# Patient Record
Sex: Female | Born: 1980 | Race: Black or African American | Hispanic: No | Marital: Single | Smoking: Former smoker
Health system: Southern US, Community
[De-identification: ages and names within clinical notes are randomized; demographics above are authoritative.]

## PROBLEM LIST (undated history)

## (undated) DIAGNOSIS — M549 Dorsalgia, unspecified: Secondary | ICD-10-CM

## (undated) DIAGNOSIS — J439 Emphysema, unspecified: Secondary | ICD-10-CM

## (undated) DIAGNOSIS — T7840XA Allergy, unspecified, initial encounter: Secondary | ICD-10-CM

## (undated) DIAGNOSIS — J45909 Unspecified asthma, uncomplicated: Secondary | ICD-10-CM

## (undated) DIAGNOSIS — Z6841 Body Mass Index (BMI) 40.0 and over, adult: Secondary | ICD-10-CM

## (undated) HISTORY — DX: Allergy, unspecified, initial encounter: T78.40XA

## (undated) HISTORY — DX: Morbid (severe) obesity due to excess calories: E66.01

## (undated) HISTORY — PX: KNEE SURGERY: SHX244

## (undated) HISTORY — DX: Dorsalgia, unspecified: M54.9

## (undated) HISTORY — DX: Body Mass Index (BMI) 40.0 and over, adult: Z684

---

## 2009-08-30 ENCOUNTER — Ambulatory Visit: Payer: Self-pay | Admitting: Anesthesiology

## 2014-06-01 ENCOUNTER — Ambulatory Visit: Payer: Self-pay | Admitting: Family Medicine

## 2014-06-15 ENCOUNTER — Ambulatory Visit: Payer: Self-pay | Admitting: Family Medicine

## 2014-07-14 ENCOUNTER — Ambulatory Visit: Payer: Self-pay | Admitting: Orthopedic Surgery

## 2014-11-24 ENCOUNTER — Ambulatory Visit: Payer: Self-pay | Admitting: Orthopedic Surgery

## 2015-03-09 ENCOUNTER — Ambulatory Visit
Admit: 2015-03-09 | Disposition: A | Discharge status: Home / Self Care | Payer: Self-pay | Attending: Family Medicine | Admitting: Family Medicine

## 2015-04-01 NOTE — Op Note (Signed)
PATIENT NAME:  Virginia Camacho, Virginia Camacho MR#:  [id number] DATE OF BIRTH:  May 22, 1981  DATE OF PROCEDURE:  11/24/2014  PREOPERATIVE DIAGNOSIS: Right knee anterior cruciate ligament tear.   POSTOPERATIVE DIAGNOSIS: Right knee anterior cruciate ligament tear.   PROCEDURE: Right knee anterior cruciate ligament reconstruction.   ANESTHESIA: General.   SURGEON: Leitha SchullerMichael J. Yoshimi Sarr, MD  ASSISTANT: Lollie Marrow. Chris Gaines, PA-C  DESCRIPTION OF PROCEDURE: The patient was brought to the Operating Room and after adequate anesthesia was obtained, the right leg was prepped and draped in the usual sterile fashion with an Alvarado leg holder utilized. After patient identification, a timeout procedure was completed. An inferolateral portal was made and the arthroscope was introduced. Initial inspection revealed a normal-appearing patellofemoral articulation coming around medially. An inferomedial portal was made and the medial meniscus was intact. 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 The anterior cruciate ligament was completely torn and adherent to the PCL, detached off the femoral side. The lateral compartment was normal.   The knee was prepared by first debriding the old ACL down to stumps, and localizing the femoral and tibial footprints. Next, a 6.5 mm guide was utilized through the anterior medial portal with drilling approximately 25 mm for the bone tunnel on the femoral side. Through this anteromedial portal, the guide was placed for the tibial guide pin, and a small incision made and a 10 mm drill hole made. This 10 mm was based on preparation of the anterior tibialis graft, which was prepared with splitting the tendon longitudinally, half way through, and attaching it to the Aper-Fix AM femoral implant, 10 x 24 mm.   After the graft had been prepared with sutures on all the ends of these, a suture was placed through the tibial tunnel out the anteromedial  portal. The implant was then placed and tapped into position and the implant tightened. With pulling on this, the implant was stable. The sutures were then passed out through the tibial tunnel and the 3 limbs of the graft were placed down to the tibial tunnel. The knee was cycled through a range of motion, and tension applied to the graft with the knee in extension as the tibial interference screw was placed with its tibial sleeve to provide compression against the graft. On inspection, the ACL appeared very stable to probing and to Lachman test.   Excess tendon was removed, along with the tab on the tibial side to try to prevent postoperative pain from this. The pre- and post-procedure pictures having been obtained, 2-0 Vicryl was used deep, 4-0 nylon for the skin, 30 mL of 0.5% Sensorcaine with epinephrine was infiltrated in the area of the incisions. Xeroform, 4x4's, Webril, Ace wrap, Polar Care and hinged knee brace locked in extension were applied.   ESTIMATED BLOOD LOSS: 50 mL   COMPLICATIONS: None.   SPECIMEN: None. Pre- and post-procedure pictures obtained.   IMPLANTS: Femoral Implant: [place] Aper-Fix AM, 10 x 24. Tibial Implants: 10 x 30 with an anterior tibialis allograft.    ____________________________ Leitha SchullerMichael J. Odysseus Cada, MD mjm:MT D: 11/24/2014 22:58:00 ET T: 11/25/2014 09:45:29 ET JOB#: [id number]  cc: Leitha SchullerMichael J. Meredeth Furber, MD, <Dictator> Leitha SchullerMICHAEL J Fin Hupp MD ELECTRONICALLY SIGNED 11/25/2014 13:11

## 2015-05-25 ENCOUNTER — Ambulatory Visit: Payer: Self-pay | Admitting: Family Medicine

## 2015-09-28 ENCOUNTER — Emergency Department
Admission: EM | Admit: 2015-09-28 | Discharge: 2015-09-28 | Disposition: A | Discharge status: Home / Self Care | Payer: Managed Care, Other (non HMO) | Attending: Emergency Medicine | Admitting: Emergency Medicine

## 2015-09-28 ENCOUNTER — Emergency Department: Payer: Managed Care, Other (non HMO)

## 2015-09-28 ENCOUNTER — Encounter: Payer: Self-pay | Admitting: *Deleted

## 2015-09-28 ENCOUNTER — Ambulatory Visit (INDEPENDENT_AMBULATORY_CARE_PROVIDER_SITE_OTHER): Payer: Managed Care, Other (non HMO) | Admitting: Family Medicine

## 2015-09-28 ENCOUNTER — Encounter: Payer: Self-pay | Admitting: Family Medicine

## 2015-09-28 VITALS — BP 108/75 | HR 102 | Temp 98.9°F | Resp 19 | Ht 63.0 in | Wt 247.2 lb

## 2015-09-28 DIAGNOSIS — R519 Headache, unspecified: Secondary | ICD-10-CM

## 2015-09-28 DIAGNOSIS — H1132 Conjunctival hemorrhage, left eye: Secondary | ICD-10-CM | POA: Diagnosis not present

## 2015-09-28 DIAGNOSIS — Z720 Tobacco use: Secondary | ICD-10-CM | POA: Diagnosis not present

## 2015-09-28 DIAGNOSIS — H5712 Ocular pain, left eye: Secondary | ICD-10-CM | POA: Diagnosis present

## 2015-09-28 DIAGNOSIS — R51 Headache: Secondary | ICD-10-CM | POA: Diagnosis not present

## 2015-09-28 NOTE — Progress Notes (Signed)
Name: Mariella SaaCandace M Schnetzer   MRN: [id number]    DOB: 1981-09-23   Date:09/28/2015       Progress Note  Subjective  Chief Complaint  Chief Complaint  Patient presents with  . Acute Visit    Pain aound eye (red) x2 days  . Headache    Eye Pain  The left eye is affected. This is a new problem. The current episode started yesterday. The problem occurs constantly. There was no injury mechanism. The pain is at a severity of 4/10. The pain is moderate. There is no known exposure to pink eye. She does not wear contacts. Associated symptoms include eye redness. Pertinent negatives include no blurred vision, eye discharge, double vision, nausea or vomiting. She has tried nothing for the symptoms.   Past Medical History  Diagnosis Date  . Allergy     History reviewed. No pertinent past surgical history.  Family History  Problem Relation Age of Onset  . Diabetes Mother     Social History   Social History  . Marital Status: Single    Spouse Name: N/A  . Number of Children: N/A  . Years of Education: N/A   Occupational History  . Not on file.   Social History Main Topics  . Smoking status: Current Every Day Smoker -- 1.00 packs/day    Types: Cigarettes  . Smokeless tobacco: Never Used  . Alcohol Use: 0.0 oz/week    0 Standard drinks or equivalent per week     Comment: rarely  . Drug Use: 1.00 per week    Special: Marijuana     Comment: occasional   . Sexual Activity: Not on file   Other Topics Concern  . Not on file   Social History Narrative  . No narrative on file     Current outpatient prescriptions:  .  acetaminophen (RA ACETAMINOPHEN) 650 MG CR tablet, Take by mouth., Disp: , Rfl:  .  cyclobenzaprine (FLEXERIL) 10 MG tablet, Take by mouth., Disp: , Rfl:   Allergies  Allergen Reactions  . Clarithromycin Other (See Comments) and Nausea Only   Review of Systems  Eyes: Positive for pain and redness. Negative for blurred vision, double vision and discharge.   Gastrointestinal: Negative for nausea and vomiting.  Neurological: Positive for headaches.    Objective  Filed Vitals:   09/28/15 1529  BP: 108/75  Pulse: 102  Temp: 98.9 F (37.2 C)  TempSrc: Oral  Resp: 19  Height: 5\' 3"  (1.6 m)  Weight: 247 lb 3.2 oz (112.129 kg)  SpO2: 98%    Physical Exam  HENT:  Head: Normocephalic.  Mouth/Throat: No posterior oropharyngeal erythema.  Tenderness to palpation over the left periorbital, and frontal area extending across the forehead.  Eyes: Left eye exhibits discharge. Left eye exhibits no exudate. No foreign body present in the left eye. Right conjunctiva has no hemorrhage. Left conjunctiva has a hemorrhage.  Cardiovascular: Normal rate and regular rhythm.   Pulmonary/Chest: Breath sounds normal.  Nursing note and vitals reviewed.    Assessment & Plan  1. Subconjunctival hemorrhage of left eye We will refer patient to ED for evaluation of subconjunctival hemorrhage associated with headaches.she will likely need a CT scan of the brain. Blood pressure is stable. Plan discussed with patient and the charge nurse at the ER.   Gittel Mccamish Asad A. Faylene KurtzShah [facility] 09/28/2015 4:21 PM

## 2015-09-28 NOTE — ED Notes (Signed)
Pt reports yesterday her left eye became red and she started having a headache.  Headache is on left side of face and around left eye.  Pt was seen by dr. Sherryll BurgerShah and was sent to the er for an eval for subconjunctival hemorrhage. No n/v/d.  Pt took tylenol this morning for headache without relief.

## 2015-09-28 NOTE — ED Provider Notes (Signed)
[facility] Emergency Department Provider Note  ____________________________________________  Time seen: 1749  I have reviewed the triage vital signs and the nursing notes.   HISTORY  Chief Complaint Eye Pain     HPI Virginia Camacho is a 34 y.o. female who reports she has been having a headache around her left eye for the past day and half. She has had some low-grade fever and she has been feeling kind of achy lately. She denies any neurologic dysfunction. Her hands and feet are moving well. She does have pain in her left eye and noticed that she had blood over the surface of part of the left eye yesterday. She went to see her regular doctor today[facility] Dr. Clelia CroftShaw was concerned about the headache in conjunction with a subconjunctival hemorrhage and sent the patient to the emergency department for a CT scan of the head.  The patient denies having headaches like this in the past. She denies any nausea or vomiting.   Past Medical History  Diagnosis Date  . Allergy     Patient Active Problem List   Diagnosis Date Noted  . Subconjunctival hemorrhage of left eye 09/28/2015    Past Surgical History  Procedure Laterality Date  . Knee surgery Right     Current Outpatient Rx  Name  Route  Sig  Dispense  Refill  . acetaminophen (RA ACETAMINOPHEN) 650 MG CR tablet   Oral   Take 650 mg by mouth every 8 (eight) hours as needed.          Marland Kitchen. HYDROcodone-acetaminophen (NORCO/VICODIN) 5-325 MG tablet   Oral   Take 1 tablet by mouth every 6 (six) hours as needed for moderate pain.         . cyclobenzaprine (FLEXERIL) 10 MG tablet   Oral   Take 10 mg by mouth 3 (three) times daily as needed.            Allergies Clarithromycin and Oxycodone  Family History  Problem Relation Age of Onset  . Diabetes Mother     Social History Social History  Substance Use Topics  . Smoking status: Current Every Day Smoker -- 1.00  packs/day    Types: Cigarettes  . Smokeless tobacco: Never Used  . Alcohol Use: 0.0 oz/week    0 Standard drinks or equivalent per week     Comment: rarely    Review of Systems  Constitutional: Negative for fever. ENT: Notable for some conjunctival hemorrhage and headache. Cardiovascular: Negative for chest pain. Respiratory: Negative for cough. Gastrointestinal: Negative for abdominal pain, vomiting and diarrhea. Genitourinary: Negative for dysuria. Musculoskeletal: No myalgias or injuries. Skin: Negative for rash. Neurological: Positive for headache, left periorbital.   10-point ROS otherwise negative.  ____________________________________________   PHYSICAL EXAM:  VITAL SIGNS: ED Triage Vitals  Enc Vitals Group     BP 09/28/15 1723 142/99 mmHg     Pulse Rate 09/28/15 1723 92     Resp 09/28/15 1723 18     Temp 09/28/15 1723 97.9 F (36.6 C)     Temp Source 09/28/15 1723 Oral     SpO2 09/28/15 1723 98 %     Weight 09/28/15 1723 247 lb (112.038 kg)     Height 09/28/15 1723 5\' 4"  (1.626 m)     Head Cir --      Peak Flow --      Pain Score 09/28/15 1725 6     Pain Loc --  Pain Edu? --      Excl. in GC? --     Constitutional: Alert and oriented. Well appearing and in no distress. ENT   Head: Normocephalic and atraumatic.   Nose: No congestion/rhinnorhea.         Eyes: Notable subconjunctival hemorrhage in the medial and medial superior portion of the left eye. Other than this, the examination appears normal. Extraocular muscles are intact. Pupil responses normal and equal bilaterally. Cardiovascular: Normal rate, regular rhythm, no murmur noted Respiratory:  Normal respiratory effort, no tachypnea.    Breath sounds are clear and equal bilaterally.  Gastrointestinal: Soft and nontender. No distention.  Back: No muscle spasm, no tenderness, no CVA tenderness. Musculoskeletal: No deformity noted. Nontender with normal range of motion in all extremities.   No noted edema. Neurologic:  Normal speech and language. Equal grip strength bilaterally. Negative Romberg and pronator drift. Good finger to nose. 5 or 5 strength in all 4 extremities. Intact sensation. Cranial nerves II through XII are intact. No gross focal neurologic deficits are appreciated.  Skin:  Skin is warm, dry. No rash noted. Psychiatric: Mood and affect are normal. Speech and behavior are normal.  ____________________________________________  ____________________________________________    RADIOLOGY  CT head: IMPRESSION: No acute intracranial abnormality.    PROCEDURES  ____________________________________________   INITIAL IMPRESSION / ASSESSMENT AND PLAN / ED COURSE  Pertinent labs & imaging results that were available during my care of the patient were reviewed by me and considered in my medical decision making (see chart for details).  This patient overall looks well and she has no focal neurologic deficit. She does have a headache behind the left eye. She was sent here to obtain a CT scan of the head. I've advised her that I do not think a CT scan is indicated, but she prefers to have the scan done for her own reassurance.  I do not see any indication for blood tests. The patient is appreciated this that she does not want to have a blood draw.  ----------------------------------------- 7:11 PM on 09/28/2015 -----------------------------------------  Head CT is negative.  Patient looks well. We will discharge her home with instructions about subconjunctival hemorrhage. ____________________________________________   FINAL CLINICAL IMPRESSION(S) / ED DIAGNOSES  Final diagnoses:  Subconjunctival hemorrhage of left eye  Left-sided headache      Darien Ramus, MD 09/28/15 [phone]

## 2015-09-28 NOTE — ED Notes (Addendum)
Patient states she was sent here by Dr. Sherryll BurgerShah for  subconjunctival hemorrhage of left eye. Patient states she has had left eye pain for two days. Patient states headache began last night

## 2015-09-28 NOTE — Discharge Instructions (Signed)
Your head CT was negative. Follow-up with regular doctor for further evaluation and ongoing treatment.  You may follow-up with an ophthalmologist for your eye as well as. Call [facility] for further evaluation. Return to the emergency department if you have any other urgent concerns.  Subconjunctival Hemorrhage Subconjunctival hemorrhage is bleeding that happens between the white part of your eye (sclera) and the clear membrane that covers the outside of your eye (conjunctiva). There are many tiny blood vessels near the surface of your eye. A subconjunctival hemorrhage happens when one or more of these vessels breaks and bleeds, causing a red patch to appear on your eye. This is similar to a bruise. Depending on the amount of bleeding, the red patch may only cover a small area of your eye or it may cover the entire visible part of the sclera. If a lot of blood collects under the conjunctiva, there may also be swelling. Subconjunctival hemorrhages do not affect your vision or cause pain, but your eye may feel irritated if there is swelling. Subconjunctival hemorrhages usually do not require treatment, and they disappear on their own within two weeks. CAUSES This condition may be caused by:  Mild trauma, such as rubbing your eye too hard.  Severe trauma or blunt injuries.  Coughing, sneezing, or vomiting.  Straining, such as when lifting a heavy object.  High blood pressure.  Recent eye surgery.  A history of diabetes.  Certain medicines, especially blood thinners (anticoagulants).  Other conditions, such as eye tumors, bleeding disorders, or blood vessel abnormalities. Subconjunctival hemorrhages can happen without an obvious cause.  SYMPTOMS  Symptoms of this condition include:  A bright red or dark red patch on the white part of the eye.  The red area may spread out to cover a larger area of the eye before it goes away.  The red area may turn brownish-yellow before it  goes away.  Swelling.  Mild eye irritation. DIAGNOSIS This condition is diagnosed with a physical exam. If your subconjunctival hemorrhage was caused by trauma, your health care provider may refer you to an eye specialist (ophthalmologist) or another specialist to check for other injuries. You may have other tests, including:  An eye exam.  A blood pressure check.  Blood tests to check for bleeding disorders. If your subconjunctival hemorrhage was caused by trauma, X-rays or a CT scan may be done to check for other injuries. TREATMENT Usually, no treatment is needed. Your health care provider may recommend eye drops or cold compresses to help with discomfort. HOME CARE INSTRUCTIONS  Take over-the-counter and prescription medicines only as directed by your health care provider.  Use eye drops or cold compresses to help with discomfort as directed by your health care provider.  Avoid activities, things, and environments that may irritate or injure your eye.  Keep all follow-up visits as told by your health care provider. This is important. SEEK MEDICAL CARE IF:  You have pain in your eye.  The bleeding does not go away within 3 weeks.  You keep getting new subconjunctival hemorrhages. SEEK IMMEDIATE MEDICAL CARE IF:  Your vision changes or you have difficulty seeing.  You suddenly develop severe sensitivity to light.  You develop a severe headache, persistent vomiting, confusion, or abnormal tiredness (lethargy).  Your eye seems to bulge or protrude from your eye socket.  You develop unexplained bruises on your body.  You have unexplained bleeding in another area of your body.   This information is not intended  to replace advice given to you by your health care provider. Make sure you discuss any questions you have with your health care provider.   Document Released: 11/25/2005 Document Revised: 08/16/2015 Document Reviewed: 02/01/2015 Elsevier Interactive Patient  Education [employer]2016 [employer]vier Inc.

## 2015-10-06 ENCOUNTER — Encounter: Payer: Self-pay | Admitting: Family Medicine

## 2015-10-06 ENCOUNTER — Ambulatory Visit (INDEPENDENT_AMBULATORY_CARE_PROVIDER_SITE_OTHER): Payer: Managed Care, Other (non HMO) | Admitting: Family Medicine

## 2015-10-06 VITALS — BP 118/82 | HR 97 | Temp 98.8°F | Resp 18 | Ht 63.0 in | Wt 244.2 lb

## 2015-10-06 DIAGNOSIS — G43909 Migraine, unspecified, not intractable, without status migrainosus: Secondary | ICD-10-CM | POA: Insufficient documentation

## 2015-10-06 DIAGNOSIS — G44219 Episodic tension-type headache, not intractable: Secondary | ICD-10-CM | POA: Diagnosis not present

## 2015-10-06 DIAGNOSIS — G8929 Other chronic pain: Secondary | ICD-10-CM | POA: Insufficient documentation

## 2015-10-06 DIAGNOSIS — M5442 Lumbago with sciatica, left side: Secondary | ICD-10-CM

## 2015-10-06 NOTE — Progress Notes (Signed)
Name: Virginia SaaCandace M Gatton   MRN: [id number]    DOB: [birth year]   Date:10/06/2015       Progress Note  Subjective  Chief Complaint  Chief Complaint  Patient presents with  . discuss CT results    HPI  Pt. Is here to discuss results of CT Scan of Head. She had conjunctivitis of left eye and a severe headache along with elevated BP at her last office visit. She was sent to the ER for evaluation. A CT scan of head showed no hemorrhage or other abnormality. BP has returned back to normal, conjunctivitis has resolved, and headache has almost resolved. She has a follow up scheduled with an ophthalmologist. She believes it was stress and anxiety that caused her to have a headache.  Past Medical History  Diagnosis Date  . Allergy     Past Surgical History  Procedure Laterality Date  . Knee surgery Right     Family History  Problem Relation Age of Onset  . Diabetes Mother     Social History   Social History  . Marital Status: Single    Spouse Name: N/A  . Number of Children: N/A  . Years of Education: N/A   Occupational History  . Not on file.   Social History Main Topics  . Smoking status: Current Every Day Smoker -- 1.00 packs/day    Types: Cigarettes  . Smokeless tobacco: Never Used  . Alcohol Use: 0.0 oz/week    0 Standard drinks or equivalent per week     Comment: rarely  . Drug Use: 1.00 per week    Special: Marijuana     Comment: occasional   . Sexual Activity: Not on file   Other Topics Concern  . Not on file   Social History Narrative    Current outpatient prescriptions:  .  Ergocalciferol 2000 UNITS TABS, Take by mouth., Disp: , Rfl:  .  mometasone (ELOCON) 0.1 % cream, ELOCON, 0.1% (External Cream)  1 (one) Cream daily for 15 days  Quantity: 1;  Refills: 1   Ordered :01-Mar-2015  Velta AddisonShah, Amit Meloy Asad MD;  Started 01-Mar-2015 Active, Disp: , Rfl:  .  acetaminophen (RA ACETAMINOPHEN) 650 MG CR tablet, Take 650 mg by mouth every 8 (eight) hours as needed. , Disp: ,  Rfl:  .  cyclobenzaprine (FLEXERIL) 10 MG tablet, Take 10 mg by mouth 3 (three) times daily as needed. , Disp: , Rfl:  .  HYDROcodone-acetaminophen (NORCO/VICODIN) 5-325 MG tablet, Take 1 tablet by mouth every 6 (six) hours as needed for moderate pain., Disp: , Rfl:   Allergies  Allergen Reactions  . Clarithromycin Other (See Comments) and Nausea Only    Hot  . Oxycodone Hives   Review of Systems  Constitutional: Negative for fever, chills and weight loss.  Eyes: Negative for blurred vision, double vision, photophobia, pain, discharge and redness.  Cardiovascular: Negative for chest pain.  Neurological: Negative for headaches.    Objective  Filed Vitals:   10/06/15 1037  BP: 118/82  Pulse: 97  Temp: 98.8 F (37.1 C)  Resp: 18  Height: 5\' 3"  (1.6 m)  Weight: 244 lb 4 oz (110.791 kg)  SpO2: 98%    Physical Exam  Constitutional: She is oriented to person, place, and time and well-developed, well-nourished, and in no distress.  HENT:  Head: Normocephalic and atraumatic.  Eyes: Conjunctivae are normal. Pupils are equal, round, and reactive to light.  Cardiovascular: Normal rate and regular rhythm.   Pulmonary/Chest: Effort normal  and breath sounds normal.  Neurological: She is alert and oriented to person, place, and time.  Nursing note and vitals reviewed.    Assessment & Plan  1. Episodic tension-type headache, not intractable Headache has almost completely resolved as his conjunctivitis. Discussed stress reduction strategies. Patient does not wish to take any medicine for anxiety at this stage.follow-up as needed.    Edan Juday Asad A. Faylene [facility] 10/06/2015 10:47 AM

## 2015-11-28 ENCOUNTER — Encounter: Payer: Managed Care, Other (non HMO) | Admitting: Family Medicine

## 2015-12-20 ENCOUNTER — Encounter (INDEPENDENT_AMBULATORY_CARE_PROVIDER_SITE_OTHER): Payer: Self-pay

## 2015-12-20 ENCOUNTER — Encounter: Payer: Self-pay | Admitting: Family Medicine

## 2015-12-20 ENCOUNTER — Ambulatory Visit (INDEPENDENT_AMBULATORY_CARE_PROVIDER_SITE_OTHER): Payer: Managed Care, Other (non HMO) | Admitting: Family Medicine

## 2015-12-20 ENCOUNTER — Ambulatory Visit
Admission: RE | Admit: 2015-12-20 | Discharge: 2015-12-20 | Disposition: A | Discharge status: Home / Self Care | Payer: Managed Care, Other (non HMO) | Source: Ambulatory Visit | Attending: Family Medicine | Admitting: Family Medicine

## 2015-12-20 VITALS — BP 120/69 | HR 101 | Temp 98.5°F | Resp 18 | Ht 63.0 in | Wt 246.5 lb

## 2015-12-20 DIAGNOSIS — M25561 Pain in right knee: Secondary | ICD-10-CM | POA: Insufficient documentation

## 2015-12-20 DIAGNOSIS — T1490XA Injury, unspecified, initial encounter: Principal | ICD-10-CM

## 2015-12-20 DIAGNOSIS — M79641 Pain in right hand: Secondary | ICD-10-CM

## 2015-12-20 DIAGNOSIS — M5136 Other intervertebral disc degeneration, lumbar region: Secondary | ICD-10-CM | POA: Insufficient documentation

## 2015-12-20 DIAGNOSIS — G8929 Other chronic pain: Secondary | ICD-10-CM | POA: Insufficient documentation

## 2015-12-20 NOTE — Progress Notes (Signed)
Name: Virginia Camacho   MRN: [id number]    DOB: Jul 19, 1981   Date:12/20/2015       Progress Note  Subjective  Chief Complaint  Chief Complaint  Patient presents with  . Follow-up    Knee pain    Hand Pain  There was no injury mechanism. The pain is present in the right hand. The quality of the pain is described as burning. Radiates to: raidtaes from proximal to distal right hand including fingers. The pain is moderate. The pain has been fluctuating since the incident. Pertinent negatives include no chest pain, muscle weakness, numbness or tingling. The symptoms are aggravated by movement, lifting and palpation. She has tried acetaminophen (Tylenol Arthritis Pain relief.) for the symptoms. The treatment provided moderate relief.   Fall on [employer] on ice outside her home, slipped and fel, right knee hit the ice on concrete, experiencing pain in her knee since then. Has taken Flexeril and Hydrocodone for relief. She had ACL reconstruction surgery last year by Dr. Rudene Christians. Wants her knee evaluated.   Past Medical History  Diagnosis Date  . Allergy     Past Surgical History  Procedure Laterality Date  . Knee surgery Right     Family History  Problem Relation Age of Onset  . Diabetes Mother     Social History   Social History  . Marital Status: Single    Spouse Name: N/A  . Number of Children: N/A  . Years of Education: N/A   Occupational History  . Not on file.   Social History Main Topics  . Smoking status: Current Every Day Smoker -- 1.00 packs/day    Types: Cigarettes  . Smokeless tobacco: Never Used  . Alcohol Use: 0.0 oz/week    0 Standard drinks or equivalent per week     Comment: rarely  . Drug Use: 1.00 per week    Special: Marijuana     Comment: occasional   . Sexual Activity: Not on file   Other Topics Concern  . Not on file   Social History Narrative     Current outpatient prescriptions:  .  acetaminophen (RA ACETAMINOPHEN) 650 MG CR tablet, Take  650 mg by mouth every 8 (eight) hours as needed. , Disp: , Rfl:  .  cyclobenzaprine (FLEXERIL) 10 MG tablet, Take 10 mg by mouth 3 (three) times daily as needed. , Disp: , Rfl:  .  HYDROcodone-acetaminophen (NORCO/VICODIN) 5-325 MG tablet, Take 1 tablet by mouth every 6 (six) hours as needed for moderate pain., Disp: , Rfl:  .  mometasone (ELOCON) 0.1 % cream, ELOCON, 0.1% (External Cream)  1 (one) Cream daily for 15 days  Quantity: 1;  Refills: 1   Ordered :01-Mar-2015  Rochel Brome MD;  Started 01-Mar-2015 Active, Disp: , Rfl:   Allergies  Allergen Reactions  . Clarithromycin Other (See Comments) and Nausea Only    Hot  . Oxycodone Hives     Review of Systems  Constitutional: Negative for fever, chills and malaise/fatigue.  Cardiovascular: Negative for chest pain.  Musculoskeletal: Positive for back pain and joint pain.  Neurological: Negative for tingling and numbness.     Objective  Filed Vitals:   12/20/15 0907  BP: 120/69  Pulse: 101  Temp: 98.5 F (36.9 C)  TempSrc: Oral  Resp: 18  Height: _0  (1.6 m)  Weight: 246 lb 8 oz (111.812 kg)  SpO2: 97%    Physical Exam  Musculoskeletal:       Right  knee: She exhibits swelling. She exhibits no deformity, no laceration and no erythema. Tenderness found.       Right hand: She exhibits tenderness and bony tenderness. She exhibits normal range of motion, no deformity and no swelling. Normal sensation noted.       Hands:      Legs: Tenderness around the right supra-patellar area extending to medial knee. Minimal swelling. Mild tenderness over the dorsal proximal right hand, no erythema, normal ROM, no paresthesias.  Nursing note and vitals reviewed.    Assessment & Plan  1. Pain of right knee after injury Will obtain X ray of right knee to rule out acute skeletal pathology. Advised to rest for today. Work note provided. - DG Knee Complete 4 Views Right; Future  2. Right hand pain  - DG Hand Complete Right;  Future - CBC with Differential - Sed Rate (ESR) - Rheumatoid Factor - Uric acid   Lavern Crimi [facility] 12/20/2015 9:14 AM

## 2015-12-21 LAB — CBC WITH DIFFERENTIAL/PLATELET
BASOS: 0 %
Basophils Absolute: 0 10*3/uL (ref 0.0–0.2)
EOS (ABSOLUTE): 0.1 10*3/uL (ref 0.0–0.4)
EOS: 2 %
HEMATOCRIT: 40.6 % (ref 34.0–46.6)
Hemoglobin: 13.4 g/dL (ref 11.1–15.9)
IMMATURE GRANULOCYTES: 0 %
Immature Grans (Abs): 0 10*3/uL (ref 0.0–0.1)
Lymphocytes Absolute: 3 10*3/uL (ref 0.7–3.1)
Lymphs: 44 %
MCH: 26.3 pg — ABNORMAL LOW (ref 26.6–33.0)
MCHC: 33 g/dL (ref 31.5–35.7)
MCV: 80 fL (ref 79–97)
MONOS ABS: 0.4 10*3/uL (ref 0.1–0.9)
Monocytes: 6 %
NEUTROS ABS: 3.3 10*3/uL (ref 1.4–7.0)
NEUTROS PCT: 48 %
Platelets: 300 10*3/uL (ref 150–379)
RBC: 5.1 x10E6/uL (ref 3.77–5.28)
RDW: 14.6 % (ref 12.3–15.4)
WBC: 6.8 10*3/uL (ref 3.4–10.8)

## 2015-12-21 LAB — SEDIMENTATION RATE: SED RATE: 35 mm/h — AB (ref 0–32)

## 2015-12-21 LAB — RHEUMATOID FACTOR: RHEUMATOID FACTOR: 11.2 [IU]/mL (ref 0.0–13.9)

## 2015-12-21 LAB — URIC ACID: Uric Acid: 5.9 mg/dL (ref 2.5–7.1)

## 2015-12-29 ENCOUNTER — Ambulatory Visit (INDEPENDENT_AMBULATORY_CARE_PROVIDER_SITE_OTHER): Payer: Managed Care, Other (non HMO) | Admitting: Family Medicine

## 2015-12-29 ENCOUNTER — Encounter: Payer: Self-pay | Admitting: Family Medicine

## 2015-12-29 VITALS — BP 118/74 | HR 101 | Temp 98.3°F | Resp 18 | Wt 242.9 lb

## 2015-12-29 DIAGNOSIS — R6889 Other general symptoms and signs: Secondary | ICD-10-CM | POA: Diagnosis not present

## 2015-12-29 MED ORDER — SALINE SPRAY 0.65 % NA SOLN: 2.0000 | NASAL | Status: DC | PRN

## 2015-12-29 MED ORDER — LOPERAMIDE HCL 2 MG PO TABS: 2.0000 mg | ORAL_TABLET | Freq: Four times a day (QID) | ORAL | Status: DC | PRN

## 2015-12-29 NOTE — Progress Notes (Signed)
Name: TOMORROW DEHAAS   MRN: [id number]    DOB: Jul 07, 1981   Date:12/29/2015       Progress Note  Subjective  Chief Complaint  Chief Complaint  Patient presents with  . GI Problem    diarrhea x 2 days (lots of mucus)  . URI    fever, chills, productive cough (thick and darkly colored). nonresponsive to Tylenol Severe Cold.  . Fatigue    no appetitie, very tired and weak  . Headache    headache starts near the eyes and goes around her head. nagging.    HPI  Flu Like Symptoms: her boss was diagnosed with the flu last week. Over the past few days she has developed chills, fever, productive cough described as yellow to brown, lack of appetite, fatigue, body aches, and headaches. She has lost weight since last week. Diarrhea is described as watery stools, no blood ( 5 episodes already today ) associated with urgency. She denies abdominal pain, but she has been nauseated.    Patient Active Problem List   Diagnosis Date Noted  . Pain of right knee after injury 12/20/2015  . Right hand pain 12/20/2015  . Degeneration of intervertebral disc of lumbar region 12/20/2015  . Anterior knee pain 12/20/2015  . Headache, tension type, episodic 10/06/2015  . Chronic LBP 10/06/2015  . Headache, migraine 10/06/2015    Past Surgical History  Procedure Laterality Date  . Knee surgery Right     Family History  Problem Relation Age of Onset  . Diabetes Mother     Social History   Social History  . Marital Status: Single    Spouse Name: N/A  . Number of Children: N/A  . Years of Education: N/A   Occupational History  . Not on file.   Social History Main Topics  . Smoking status: Current Every Day Smoker -- 1.00 packs/day    Types: Cigarettes  . Smokeless tobacco: Never Used  . Alcohol Use: 0.0 oz/week    0 Standard drinks or equivalent per week     Comment: rarely  . Drug Use: 1.00 per week    Special: Marijuana     Comment: occasional   . Sexual Activity: Not on file    Other Topics Concern  . Not on file   Social History Narrative     Current outpatient prescriptions:  .  acetaminophen (RA ACETAMINOPHEN) 650 MG CR tablet, Take 650 mg by mouth every 8 (eight) hours as needed. , Disp: , Rfl:  .  cyclobenzaprine (FLEXERIL) 10 MG tablet, Take 10 mg by mouth 3 (three) times daily as needed. , Disp: , Rfl:  .  HYDROcodone-acetaminophen (NORCO/VICODIN) 5-325 MG tablet, Take 1 tablet by mouth every 6 (six) hours as needed for moderate pain., Disp: , Rfl:  .  mometasone (ELOCON) 0.1 % cream, ELOCON, 0.1% (External Cream)  1 (one) Cream daily for 15 days  Quantity: 1;  Refills: 1   Ordered :01-Mar-2015  Rochel Brome MD;  Started 01-Mar-2015 Active, Disp: , Rfl:   Allergies  Allergen Reactions  . Clarithromycin Other (See Comments) and Nausea Only    Hot  . Oxycodone Hives     ROS  Ten systems reviewed and is negative except as mentioned in HPI   Objective  Filed Vitals:   12/29/15 1514  BP: 118/74  Pulse: 101  Temp: 98.3 F (36.8 C)  TempSrc: Oral  Resp: 18  Weight: 242 lb 14.4 oz (110.179 kg)  SpO2: 98%  Body mass index is 43.04 kg/(m^2).  Physical Exam  Constitutional: Patient appears well-developed  Obese and sickly looking, watery droopy eyes wearing a mask HEENT: head atraumatic, normocephalic, pupils equal and reactive to light, ears TM normal,  neck supple, throat within normal limits Cardiovascular: Normal rate, regular rhythm and normal heart sounds.  No murmur heard. No BLE edema. Pulmonary/Chest: Effort normal and breath sounds normal. No respiratory distress. Abdominal: Soft.  There is no tenderness. Psychiatric: Patient has a normal mood and affect. behavior is normal. Judgment and thought content normal.  Recent Results (from the past 2160 hour(s))  CBC with Differential     Status: Abnormal   Collection Time: 12/20/15  9:45 AM  Result Value Ref Range   WBC 6.8 3.4 - 10.8 x10E3/uL   RBC 5.10 3.77 - 5.28 x10E6/uL    Hemoglobin 13.4 11.1 - 15.9 g/dL   Hematocrit 40.6 34.0 - 46.6 %   MCV 80 79 - 97 fL   MCH 26.3 (L) 26.6 - 33.0 pg   MCHC 33.0 31.5 - 35.7 g/dL   RDW 14.6 12.3 - 15.4 %   Platelets 300 150 - 379 x10E3/uL   Neutrophils 48 %   Lymphs 44 %   Monocytes 6 %   Eos 2 %   Basos 0 %   Neutrophils Absolute 3.3 1.4 - 7.0 x10E3/uL   Lymphocytes Absolute 3.0 0.7 - 3.1 x10E3/uL   Monocytes Absolute 0.4 0.1 - 0.9 x10E3/uL   EOS (ABSOLUTE) 0.1 0.0 - 0.4 x10E3/uL   Basophils Absolute 0.0 0.0 - 0.2 x10E3/uL   Immature Granulocytes 0 %   Immature Grans (Abs) 0.0 0.0 - 0.1 x10E3/uL  Sed Rate (ESR)     Status: Abnormal   Collection Time: 12/20/15  9:45 AM  Result Value Ref Range   Sed Rate 35 (H) 0 - 32 mm/hr  Rheumatoid Factor     Status: None   Collection Time: 12/20/15  9:45 AM  Result Value Ref Range   Rhuematoid fact SerPl-aCnc 11.2 0.0 - 13.9 IU/mL  Uric acid     Status: None   Collection Time: 12/20/15  9:45 AM  Result Value Ref Range   Uric Acid 5.9 2.5 - 7.1 mg/dL    Comment:            Therapeutic target for gout patients: <6.0     PHQ2/9: Depression screen [facility] 2/9 12/29/2015 12/20/2015 10/06/2015 09/28/2015  Decreased Interest 0 0 0 0  Down, Depressed, Hopeless 0 0 0 0  PHQ - 2 Score 0 0 0 0     Fall Risk: Fall Risk  12/29/2015 12/20/2015 10/06/2015 09/28/2015  Falls in the past year? Yes Yes No No  Number falls in past yr: 1 1 - -  Injury with Fall? Yes Yes - -  Follow up Falls evaluation completed Falls evaluation completed - -     Functional Status Survey: Is the patient deaf or have difficulty hearing?: No Does the patient have difficulty seeing, even when wearing glasses/contacts?: Yes (glasses) Does the patient have difficulty concentrating, remembering, or making decisions?: No Does the patient have difficulty walking or climbing stairs?: Yes (knee pain) Does the patient have difficulty dressing or bathing?: No Does the patient have difficulty doing errands alone  such as visiting a doctor's office or shopping?: No   Assessment & Plan  1. Flu-like symptoms   Too late to treat with Tamiflu, advised symptoms control with otc medication, clear liquids and rest. Excuse for work until  next Tuesday

## 2016-03-07 ENCOUNTER — Ambulatory Visit (INDEPENDENT_AMBULATORY_CARE_PROVIDER_SITE_OTHER): Payer: Managed Care, Other (non HMO) | Admitting: Family Medicine

## 2016-03-07 ENCOUNTER — Encounter: Payer: Self-pay | Admitting: Family Medicine

## 2016-03-07 ENCOUNTER — Other Ambulatory Visit: Payer: Self-pay | Admitting: Family Medicine

## 2016-03-07 VITALS — BP 118/70 | HR 105 | Temp 98.5°F | Resp 19 | Ht 63.0 in | Wt 247.4 lb

## 2016-03-07 DIAGNOSIS — N6310 Unspecified lump in the right breast, unspecified quadrant: Secondary | ICD-10-CM | POA: Insufficient documentation

## 2016-03-07 DIAGNOSIS — N63 Unspecified lump in breast: Secondary | ICD-10-CM | POA: Diagnosis not present

## 2016-03-07 DIAGNOSIS — Z01419 Encounter for gynecological examination (general) (routine) without abnormal findings: Secondary | ICD-10-CM | POA: Diagnosis not present

## 2016-03-07 NOTE — Progress Notes (Signed)
Name: Virginia Camacho   MRN: [id number]    DOB: Dec 12, 1980   Date:03/07/2016       Progress Note  Subjective  Chief Complaint  Chief Complaint  Patient presents with  . Annual Exam    CPE w/pap    HPI  Pt. Presents for Annual Physical Exam.   Past Medical History  Diagnosis Date  . Allergy     Past Surgical History  Procedure Laterality Date  . Knee surgery Right     Family History  Problem Relation Age of Onset  . Diabetes Mother     Social History   Social History  . Marital Status: Single    Spouse Name: N/A  . Number of Children: N/A  . Years of Education: N/A   Occupational History  . Not on file.   Social History Main Topics  . Smoking status: Current Every Day Smoker -- 1.00 packs/day    Types: Cigarettes  . Smokeless tobacco: Never Used  . Alcohol Use: 0.0 oz/week    0 Standard drinks or equivalent per week     Comment: rarely  . Drug Use: 1.00 per week    Special: Marijuana     Comment: occasional   . Sexual Activity: Not on file   Other Topics Concern  . Not on file   Social History Narrative     Current outpatient prescriptions:  .  acetaminophen (RA ACETAMINOPHEN) 650 MG CR tablet, Take 650 mg by mouth every 8 (eight) hours as needed. , Disp: , Rfl:  .  cyclobenzaprine (FLEXERIL) 10 MG tablet, Take 10 mg by mouth 3 (three) times daily as needed. , Disp: , Rfl:  .  HYDROcodone-acetaminophen (NORCO/VICODIN) 5-325 MG tablet, Take 1 tablet by mouth every 6 (six) hours as needed for moderate pain., Disp: , Rfl:  .  mometasone (ELOCON) 0.1 % cream, ELOCON, 0.1% (External Cream)  1 (one) Cream daily for 15 days  Quantity: 1;  Refills: 1   Ordered :01-Mar-2015  Velta AddisonShah, Longino Trefz Asad MD;  Started 01-Mar-2015 Active, Disp: , Rfl:  .  loperamide (IMODIUM A-D) 2 MG tablet, Take 1 tablet (2 mg total) by mouth 4 (four) times daily as needed for diarrhea or loose stools. (Patient not taking: Reported on 03/07/2016), Disp: 20 tablet, Rfl: 0 .  sodium chloride  (OCEAN) 0.65 % SOLN nasal spray, Place 2 sprays into both nostrils as needed for congestion. (Patient not taking: Reported on 03/07/2016), Disp: 15 mL, Rfl: 0  Allergies  Allergen Reactions  . Clarithromycin Other (See Comments) and Nausea Only    Hot  . Oxycodone Hives     Review of Systems  Constitutional: Negative for fever, chills and malaise/fatigue.  Eyes: Negative for blurred vision and double vision.  Respiratory: Negative for cough and shortness of breath.   Cardiovascular: Negative for chest pain and leg swelling.  Gastrointestinal: Negative for heartburn, nausea, vomiting, abdominal pain and blood in stool.  Genitourinary: Negative for dysuria and hematuria.  Musculoskeletal: Positive for back pain. Negative for joint pain.  Skin: Positive for rash (has a rash on her right upper back, intermittent, uses Elcon cream).  Neurological: Negative for dizziness and headaches.  Psychiatric/Behavioral: Negative for depression. The patient is not nervous/anxious.      Objective  Filed Vitals:   03/07/16 0911  BP: 118/70  Pulse: 105  Temp: 98.5 F (36.9 C)  TempSrc: Oral  Resp: 19  Height: 5\' 3"  (1.6 m)  Weight: 247 lb 6.4 oz (112.22 kg)  SpO2: 97%    Physical Exam  Constitutional: She is oriented to person, place, and time and well-developed, well-nourished, and in no distress.  HENT:  Head: Normocephalic and atraumatic.  Right Ear: Tympanic membrane and ear canal normal.  Left Ear: Tympanic membrane and ear canal normal.  Mouth/Throat: No posterior oropharyngeal erythema or tonsillar abscesses.  Bilateral tonsillar hypertrophy.  Eyes: Pupils are equal, round, and reactive to light.  Neck: Normal range of motion.  Cardiovascular: Normal rate and regular rhythm.   Pulmonary/Chest: Right breast exhibits mass and tenderness. Right breast exhibits no nipple discharge and no skin change. Left breast exhibits no nipple discharge, no skin change and no tenderness.     Nontender, round, mobile mass palpated at 2 o' clock position on the right breast.  Lymphadenopathy:    She has cervical adenopathy.       Left cervical: Superficial cervical adenopathy present.  Left superficial cervical adenopathy.  Neurological: She is alert and oriented to person, place, and time.  Psychiatric: Mood, memory, affect and judgment normal.  Nursing note and vitals reviewed.    Assessment & Plan  1. Well woman exam with routine gynecological exam Obtain age appropriate laboratory and screening studies. - CBC with Differential - Comprehensive Metabolic Panel (CMET) - Lipid Profile - TSH - Vitamin D (25 hydroxy) - Cytology - PAP  2. Breast mass, right Obtain diagnostic mammogram for right breast - MM Digital Diagnostic Unilat R; Future   Zyaire Mccleod Asad A. [facility] 03/07/2016 9:50 AM

## 2016-03-08 LAB — LIPID PANEL
CHOL/HDL RATIO: 4.8 ratio — AB (ref 0.0–4.4)
Cholesterol, Total: 208 mg/dL — ABNORMAL HIGH (ref 100–199)
HDL: 43 mg/dL (ref >39)
LDL Calculated: 150 mg/dL — ABNORMAL HIGH (ref 0–99)
Triglycerides: 74 mg/dL (ref 0–149)
VLDL Cholesterol Cal: 15 mg/dL (ref 5–40)

## 2016-03-08 LAB — COMPREHENSIVE METABOLIC PANEL
A/G RATIO: 1.4 (ref 1.2–2.2)
ALT: 14 IU/L (ref 0–32)
AST: 18 IU/L (ref 0–40)
Albumin: 4.2 g/dL (ref 3.5–5.5)
Alkaline Phosphatase: 46 IU/L (ref 39–117)
BILIRUBIN TOTAL: 0.4 mg/dL (ref 0.0–1.2)
BUN / CREAT RATIO: 13 (ref 8–20)
BUN: 10 mg/dL (ref 6–20)
CHLORIDE: 101 mmol/L (ref 96–106)
CO2: 23 mmol/L (ref 18–29)
Calcium: 9.6 mg/dL (ref 8.7–10.2)
Creatinine, Ser: 0.79 mg/dL (ref 0.57–1.00)
GFR, EST AFRICAN AMERICAN: 112 mL/min/{1.73_m2} (ref >59)
GFR, EST NON AFRICAN AMERICAN: 97 mL/min/{1.73_m2} (ref >59)
GLOBULIN, TOTAL: 2.9 g/dL (ref 1.5–4.5)
Glucose: 87 mg/dL (ref 65–99)
POTASSIUM: 5 mmol/L (ref 3.5–5.2)
SODIUM: 140 mmol/L (ref 134–144)
TOTAL PROTEIN: 7.1 g/dL (ref 6.0–8.5)

## 2016-03-08 LAB — CBC WITH DIFFERENTIAL/PLATELET
BASOS: 1 %
Basophils Absolute: 0 10*3/uL (ref 0.0–0.2)
EOS (ABSOLUTE): 0.1 10*3/uL (ref 0.0–0.4)
EOS: 2 %
HEMATOCRIT: 40.8 % (ref 34.0–46.6)
Hemoglobin: 13.5 g/dL (ref 11.1–15.9)
IMMATURE GRANS (ABS): 0 10*3/uL (ref 0.0–0.1)
Immature Granulocytes: 0 %
LYMPHS: 41 %
Lymphocytes Absolute: 2.6 10*3/uL (ref 0.7–3.1)
MCH: 26.9 pg (ref 26.6–33.0)
MCHC: 33.1 g/dL (ref 31.5–35.7)
MCV: 81 fL (ref 79–97)
MONOS ABS: 0.5 10*3/uL (ref 0.1–0.9)
Monocytes: 8 %
NEUTROS ABS: 3.1 10*3/uL (ref 1.4–7.0)
Neutrophils: 48 %
PLATELETS: 346 10*3/uL (ref 150–379)
RBC: 5.02 x10E6/uL (ref 3.77–5.28)
RDW: 15.1 % (ref 12.3–15.4)
WBC: 6.3 10*3/uL (ref 3.4–10.8)

## 2016-03-08 LAB — VITAMIN D 25 HYDROXY (VIT D DEFICIENCY, FRACTURES): VIT D 25 HYDROXY: 12.8 ng/mL — AB (ref 30.0–100.0)

## 2016-03-08 LAB — TSH: TSH: 0.643 u[IU]/mL (ref 0.450–4.500)

## 2016-03-13 ENCOUNTER — Telehealth: Payer: Self-pay

## 2016-03-13 MED ORDER — VITAMIN D (ERGOCALCIFEROL) 1.25 MG (50000 UNIT) PO CAPS: 50000.0000 [IU] | ORAL_CAPSULE | ORAL | Status: DC

## 2016-03-13 NOTE — Telephone Encounter (Signed)
Lab results have been reported to patient and a prescription for Vitmain D2 50,000 units take 1 capsule once a week for 12 weeks has been sent to [facility] [address] per Dr. Sherryll BurgerShah, patient has been notified and verbalized understanding

## 2016-03-14 LAB — PAP LB, RFX HPV ASCU: PAP Smear Comment: 0

## 2016-03-14 LAB — SPECIMEN STATUS REPORT

## 2016-03-20 ENCOUNTER — Ambulatory Visit
Admission: RE | Admit: 2016-03-20 | Discharge: 2016-03-20 | Disposition: A | Discharge status: Home / Self Care | Payer: Managed Care, Other (non HMO) | Source: Ambulatory Visit | Attending: Family Medicine | Admitting: Family Medicine

## 2016-03-20 ENCOUNTER — Encounter: Payer: Self-pay | Admitting: Family Medicine

## 2016-03-20 ENCOUNTER — Ambulatory Visit (INDEPENDENT_AMBULATORY_CARE_PROVIDER_SITE_OTHER): Payer: Managed Care, Other (non HMO) | Admitting: Family Medicine

## 2016-03-20 VITALS — BP 118/67 | HR 109 | Temp 98.5°F | Resp 18 | Ht 63.0 in | Wt 246.1 lb

## 2016-03-20 DIAGNOSIS — M25512 Pain in left shoulder: Secondary | ICD-10-CM

## 2016-03-20 DIAGNOSIS — G8929 Other chronic pain: Secondary | ICD-10-CM | POA: Diagnosis not present

## 2016-03-20 DIAGNOSIS — M25561 Pain in right knee: Secondary | ICD-10-CM | POA: Diagnosis not present

## 2016-03-20 DIAGNOSIS — Z6841 Body Mass Index (BMI) 40.0 and over, adult: Secondary | ICD-10-CM | POA: Diagnosis not present

## 2016-03-20 HISTORY — DX: Morbid (severe) obesity due to excess calories: E66.01

## 2016-03-20 NOTE — Progress Notes (Signed)
Name: Virginia Camacho   MRN: [id number]    DOB: March 29, 1981   Date:03/20/2016       Progress Note  Subjective  Chief Complaint  Chief Complaint  Patient presents with  . Follow-up    2 wk    HPI  R. Knee Pain: Pain present for over 3 month, intermittent, she slipped on ice in January and hit her knee, was swollen and painful. Second time was in March when she was walking her dog and tried to pull her dog from running towards another dog and felt her knee gave way and landed on the ground. She has had surgery on her right knee (ACL reconstruction) in December 2015 and worried she may have damaged her knee with the falls. She has mild arthritis in her knee seen on X ray in 2017.  L. Shoulder Pain: Pt. Presents for evaluation of pain in left shoulder, present for over a month. Starts in anterior shoulder, radiates down to left arm and feels numbness and tingling in her fingers. Pain worse with overhead activity or when she sleeps on her shoulder.   Obesity: Pt. Presents for evaluation and management of obesity. She weighs 246 lb, BMI 43.59kg/m2. 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 As part of her job, she can eat anything on the menu and usually snacks throughout the day. She is not physically active regularly. She wishes to lose weight and is requesting a structured program to achieve her goal.   Past Medical History  Diagnosis Date  . Allergy     Past Surgical History  Procedure Laterality Date  . Knee surgery Right     Family History  Problem Relation Age of Onset  . Diabetes Mother     Social History   Social History  . Marital Status: Single    Spouse Name: N/A  . Number of Children: N/A  . Years of Education: N/A   Occupational History  . Not on file.   Social History Main Topics  . Smoking status: Current Every Day Smoker -- 1.00 packs/day    Types: Cigarettes  . Smokeless tobacco: Never Used  . Alcohol Use: 0.0 oz/week    0  Standard drinks or equivalent per week     Comment: rarely  . Drug Use: 1.00 per week    Special: Marijuana     Comment: occasional   . Sexual Activity: Not on file   Other Topics Concern  . Not on file   Social History Narrative     Current outpatient prescriptions:  .  acetaminophen (RA ACETAMINOPHEN) 650 MG CR tablet, Take 650 mg by mouth every 8 (eight) hours as needed. , Disp: , Rfl:  .  cyclobenzaprine (FLEXERIL) 10 MG tablet, Take 10 mg by mouth 3 (three) times daily as needed. , Disp: , Rfl:  .  HYDROcodone-acetaminophen (NORCO/VICODIN) 5-325 MG tablet, Take 1 tablet by mouth every 6 (six) hours as needed for moderate pain., Disp: , Rfl:  .  loperamide (IMODIUM A-D) 2 MG tablet, Take 1 tablet (2 mg total) by mouth 4 (four) times daily as needed for diarrhea or loose stools., Disp: 20 tablet, Rfl: 0 .  mometasone (ELOCON) 0.1 % cream, ELOCON, 0.1% (External Cream)  1 (one) Cream daily for 15 days  Quantity: 1;  Refills: 1   Ordered :01-Mar-2015  Velta Addison MD;  Started 01-Mar-2015 Active, Disp: , Rfl:  .  sodium chloride (OCEAN) 0.65 % SOLN  nasal spray, Place 2 sprays into both nostrils as needed for congestion., Disp: 15 mL, Rfl: 0 .  Vitamin D, Ergocalciferol, (DRISDOL) 50000 units CAPS capsule, Take 1 capsule (50,000 Units total) by mouth once a week. For 12 weeks, Disp: 12 capsule, Rfl: 0  Allergies  Allergen Reactions  . Clarithromycin Other (See Comments) and Nausea Only    Hot  . Oxycodone Hives     Review of Systems  Constitutional: Negative for fever and chills.  Respiratory: Negative for shortness of breath.   Cardiovascular: Negative for chest pain.  Gastrointestinal: Negative for nausea, vomiting and abdominal pain.  Musculoskeletal: Positive for myalgias and joint pain.    Objective  Filed Vitals:   03/20/16 1501  BP: 118/67  Pulse: 109  Temp: 98.5 F (36.9 C)  TempSrc: Oral  Resp: 18  Height: 5\' 3"  (1.6 m)  Weight: 246 lb 1.6 oz (111.63 kg)   SpO2: 99%    Physical Exam  Constitutional: She is oriented to person, place, and time and well-developed, well-nourished, and in no distress.  Cardiovascular: Normal rate and regular rhythm.   Pulmonary/Chest: Effort normal and breath sounds normal.  Musculoskeletal:       Left shoulder: She exhibits tenderness and pain. She exhibits no swelling.       Left lower leg: She exhibits no tenderness, no swelling and no deformity.  Neurological: She is alert and oriented to person, place, and time.  Psychiatric: Mood, memory, affect and judgment normal.  Nursing note and vitals reviewed.    Assessment & Plan  1. Acute pain of left shoulder We will obtain x-ray to rule out acute osseous abnormality. Given her symptoms, will likely need MRI to rule out rotator cuff pathology - DG Shoulder Left; Future  2. Morbid obesity with BMI of 40.0-44.9, adult [facility](HCC) [facility] and treatment of [facility]. - Amb ref to Medical Nutrition Therapy-MNT  3. Chronic pain of right knee Recommended taking Tylenol as needed and applying ice in case of swelling. X-ray from January 2017 reviewed. If persistent, will need referral to orthopedics.  Lerae Langham Asad A. Faylene KurtzShah [facility] 03/20/2016 3:11 PM

## 2016-03-21 ENCOUNTER — Other Ambulatory Visit: Payer: Self-pay | Admitting: Family Medicine

## 2016-03-21 DIAGNOSIS — M25512 Pain in left shoulder: Secondary | ICD-10-CM

## 2016-03-21 MED ORDER — NAPROXEN 500 MG PO TABS: 500.0000 mg | ORAL_TABLET | Freq: Two times a day (BID) | ORAL | Status: DC

## 2016-04-03 ENCOUNTER — Telehealth: Payer: Self-pay | Admitting: Family Medicine

## 2016-04-03 NOTE — Telephone Encounter (Signed)
Checking status on referral for mammogram and MRI. Please return call

## 2016-04-08 NOTE — Telephone Encounter (Signed)
Notified patient of scheduled MRI for 04/25/2016 @ 10:00am

## 2016-04-25 ENCOUNTER — Ambulatory Visit: Admission: RE | Admit: 2016-04-25 | Payer: Managed Care, Other (non HMO) | Source: Ambulatory Visit

## 2016-05-01 ENCOUNTER — Telehealth: Payer: Self-pay | Admitting: Family Medicine

## 2016-05-01 NOTE — Telephone Encounter (Signed)
*   Patient was suppose to went to the hospital last week for MRI, but when she got there she was not able to be seen. Was told that Dr Sherryll BurgerShah was suppose to send something in to her insurance company to get her approved and nothing was sent. Patient is checking status. * Also checking status on her mammogram referral

## 2016-05-07 ENCOUNTER — Encounter: Payer: Self-pay | Admitting: Dietician

## 2016-05-07 ENCOUNTER — Encounter: Payer: Managed Care, Other (non HMO) | Attending: Family Medicine | Admitting: Dietician

## 2016-05-07 VITALS — Ht 63.5 in | Wt 249.3 lb

## 2016-05-07 DIAGNOSIS — E669 Obesity, unspecified: Secondary | ICD-10-CM

## 2016-05-07 NOTE — Patient Instructions (Signed)
Establish a consistent pattern of 3 meals and 3 snacks.  Balance meals with 2-4 oz. Protein, 3-4 servings of carbohydrate and non-starchy vegetables. Limit added sauces and other added fats. Try to include a fruit or vegetable or both at most meals. Eat lower calorie snacks - fruit, yogurt, etc

## 2016-05-07 NOTE — Progress Notes (Signed)
Medical Nutrition Therapy: Visit start time: 1345 end time: 1450 Assessment:  Diagnosis: obesity Past medical history: GERD, hypoglycemia Psychosocial issues/ stress concerns: Patient rates her stress as moderate, but indicates "ok" as to how well she is dealing with her stress. Preferred learning method:  . Auditory . Visual  Current weight: 249.3 lbs  Height: 63.5 in Medications, supplements: see list Progress and evaluation:  Patient came for initial medical nutrition therapy visit. She reports a highest weight of 265 lbs approximately 10 years ago when she lost down to 212 lbs by eating less rather than following a specific diet. She also eliminated soda at that time which was contributing 400-500 calories per day. She reports that she gradually gained to her present weight. She reports that she has been making effort to be more conscious of her food choices. She stated, "If I've had a fried meat at one meal, I'll try not to eat fried foods at the next meal". She works in Personnel officer at [employer]; sometimes as Investment banker, operational as well as other responsibilities. She states, "I love to eat; if someone brings me a sample to taste, I will finish whatever amount they bring, even if one taste would have been enough". She states she is tasting food throughout her work shift. She also reports the baked breads/desserts made on site are also hard to resist. She states also that she eats large portions of meat at many of her meals.She works long hours and often eats a large meal at 8-9:00pm. She also reports she has a habit of eating something sweet before bedtime.  She drinks 4-5 cups of fruit juice or lemonade daily.  She rates her motivation to lose weight as 7-8 on a 0-10 scale but rates how successful she will be as 2-3 due to her love of food and cooking.  She denies history of binging. She reports a family history of diabetes. She states that she experiences hypoglycemia if a meal is delayed. Lab work from  02/2016 indicates normal glucose but elevated LDL cholesterol of 150. Physical activity: none; has access to [employer] but states her long work hours make it difficult to develop a consistent pattern of going to work out.  Dietary Intake:  Usual eating pattern includes 2-3 meals and several snacks throughout the day. Dining out frequency: 6-7 meals per week.  Breakfast: yogurt or spicy chicken biscuit or ww toast, [place] bacon, eggs, grits, toast(2 mornings per week). Snack: small cup of almond butter with granola Lunch: 12-1:30pm- States she has been trying to eat a meat, starch and vegetable such as asparagus. Snack: fruit, cupcake or another sweet. Supper: 7-9:00pm- pasta meal or hamburger, fries from "[employer]" or roast beef/cheese sub and chips as examples Snack: cookies or another sweet  Beverages: 4-5 cups juice or lemonade, 48-64 oz. water  Nutrition Care Education:  Weight control: Commended patient on taking steps to be more mindful of her diet. Discussed how this can form a better basis for healthy eating than restrictive dieting. 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 Instructed on food group servings needed to meet basic nutrient needs and encouraged to focus on foods that need to be added verses focus on restriction. Gave and instructed on a meal pattern that balances protein with carbohydrate servings and non-starchy vegetables. Gave and reviewed sample menus which patient stated were helpful. Discussed continuing to develop strategies of mindful eating verses  relying on will power. Nutritional Diagnosis:  NI-1.5 Excessive energy intake As related to large portions, frequent intake of fried foods and baked breads and desserts.  As evidenced by diet history..  Intervention:  Establish a consistent pattern of 3 meals and 3 snacks.  Balance meals with 2-4 oz. Protein, 3-4 servings of  carbohydrate and non-starchy vegetables. Limit added sauces and other added fats. Try to include a fruit or vegetable or both at most meals. Eat lower calorie snacks - fruit, yogurt, etc  Education Materials given:  . Plate Planner . Food lists/ Planning A Balanced Meal . Sample meal pattern/ menus Learner/ who was taught:  . Patient   Level of understanding: . Partial understanding; needs review/ practice Learning barriers: . None Willingness to learn/ readiness for change: . Eager, change in progress Monitoring and Evaluation:  Dietary intake, exercise,  and body weight      follow up: 06/04/16 at 1315

## 2016-05-08 ENCOUNTER — Ambulatory Visit: Payer: Managed Care, Other (non HMO)

## 2016-05-20 ENCOUNTER — Telehealth: Payer: Self-pay | Admitting: Family Medicine

## 2016-05-20 NOTE — Telephone Encounter (Signed)
Patient is scheduled to have an MRI of her shoulder on this Thurs. 05/23/16 and per the scheduling at the hospital our office has not done the pre certification with patient's insurance company.  The insurance company is needing more clinical information.  Please contact patient at [phone] once it done.  She was originally scheduled to have MRI on 05/08/16 but because the pre certification was not done the appointment was cancelled.

## 2016-05-22 NOTE — Telephone Encounter (Signed)
Pre-Certification # [id number] 04/24/2016-07/23/2016 has been provided to patient for insurance company  

## 2016-05-22 NOTE — Telephone Encounter (Signed)
Pre-Certification # [id number] 04/24/2016-07/23/2016 has been provided to patient for insurance company

## 2016-05-23 ENCOUNTER — Ambulatory Visit
Admission: RE | Admit: 2016-05-23 | Discharge: 2016-05-23 | Disposition: A | Discharge status: Home / Self Care | Payer: Managed Care, Other (non HMO) | Source: Ambulatory Visit | Attending: Family Medicine | Admitting: Family Medicine

## 2016-05-23 DIAGNOSIS — M67814 Other specified disorders of tendon, left shoulder: Secondary | ICD-10-CM | POA: Diagnosis not present

## 2016-05-23 DIAGNOSIS — M25512 Pain in left shoulder: Secondary | ICD-10-CM

## 2016-05-29 ENCOUNTER — Other Ambulatory Visit: Payer: Self-pay

## 2016-05-29 NOTE — Telephone Encounter (Signed)
Routed to Dr. Shah for refill approval 

## 2016-06-04 ENCOUNTER — Ambulatory Visit: Payer: Managed Care, Other (non HMO) | Admitting: Dietician

## 2016-06-13 ENCOUNTER — Encounter: Payer: Self-pay | Admitting: Dietician

## 2016-06-13 ENCOUNTER — Encounter: Payer: Managed Care, Other (non HMO) | Attending: Family Medicine | Admitting: Dietician

## 2016-06-13 VITALS — Ht 63.5 in | Wt 245.2 lb

## 2016-06-13 DIAGNOSIS — E669 Obesity, unspecified: Secondary | ICD-10-CM | POA: Insufficient documentation

## 2016-06-13 NOTE — Patient Instructions (Signed)
Cheesecake recipe: Dissolve 1 package of SF lemon jello in 3/4 boiling water. Put in blender. Add  1 (8oz) container of ff cream cheese, 1 cup of fat free cottage cheese and blend until smooth. Pour into a bowl and fold in 2 cups of lite cool whip. Can pour in to graham cracker crust or can just pour into a bowl and refrigerate.  Exercise: 3-4 times a week at gym for 30-45 minutes.  Continue with previous goals. Continue with mindful eating at meals and snacks regarding fruits and vegetables.

## 2016-06-13 NOTE — Progress Notes (Signed)
Medical Nutrition Therapy Follow-up visit:  Time with patient: 9:00am - 10:00am Visit #:2 ASSESSMENT:  Diagnosis:obesity  Current weight: 245.2 lbs    Height: 63.5 lbs Medications: See list Medical History: GERD, hypoglycemia Progress and evaluation: Patient in for medical nutrition therapy follow-up. She reports she has decreased her juice intake to 0-1 cup per day. Her main beverages are water and sugar free flavored water totaling  6-8 cups per day. She continues to increase her fruit and vegetable intake. She makes an effort to anticipate meals that might lend to over eating and makes decision ahead of time on what her choices and amounts will be. She eats sherbet to help decrease her desire for full fat ice cream or going to [employer] as she would have done in the past. She made a cheesecake for July 4th holiday and made with lower fat and sugar ingredients. She decreased her typical lasagne serving by half and added a large salad to help satisfy and she stated that it did. She has lost 4 lbs in the past 5 weeks. She is also limiting salad dressing and other added fats. She has not been consistent with exercise and stated this will be a goal at this visit.  NUTRITION CARE EDUCATION:  Weight control: Reviewed with patient her current meal pattern and the different strategies she is using to improve eating habits. Commended patient on continuing with mindful eating. She states she understands better that making positive diet changes is a process. Reviewed basic food group servings needed and commended on her efforts to increase fruits and vegetables and to add to meals to help limit portions of higher calorie foods. Discussed how her goal to increase exercise is a good step forward in healthier habits.  INTERVENTION:   Exercise: 3-4 times a week at gym for 30-45 minutes.  Continue with previous goals. Continue with mindful eating at meals and snacks regarding fruits and  vegetables. Cheesecake recipe: Dissolve 1 package of SF lemon jello in 3/4 boiling water. Put in blender. Add  1 (8oz) container of ff cream cheese, 1 cup of fat free cottage cheese and blend until smooth. Pour into a bowl and fold in 2 cups of lite cool whip. Can pour in to graham cracker crust or can just pour into a bowl and refrigerate.  EDUCATION MATERIALS GIVEN:  . Goals/ instructions  LEARNER/ who was taught:  . Patient  LEVEL OF UNDERSTANDING: . Verbalizes/ demonstrates competency LEARNING BARRIERS: . None WILLINGNESS TO LEARN/READINESS FOR CHANGE: . Eager, change in progress  MONITORING AND EVALUATION:  Patient stated she would like to schedule at least one more follow-up appointment. Scheduled for 07/11/16 at 3:00pm.

## 2016-07-11 ENCOUNTER — Encounter: Payer: Self-pay | Admitting: Dietician

## 2016-07-11 ENCOUNTER — Encounter: Payer: Managed Care, Other (non HMO) | Attending: Family Medicine | Admitting: Dietician

## 2016-07-11 VITALS — Ht 63.5 in | Wt 246.7 lb

## 2016-07-11 DIAGNOSIS — E669 Obesity, unspecified: Secondary | ICD-10-CM | POA: Diagnosis not present

## 2016-07-11 NOTE — Patient Instructions (Signed)
Continue with previous goals. Continue with mindful eating. Add at least 1 serving of fruit daily. Try again regarding working out at gym.

## 2016-07-11 NOTE — Progress Notes (Signed)
Medical Nutrition Therapy Follow-up visit:  Time with patient: 1500-1530 Visit #:3 ASSESSMENT:  Diagnosis:obesity  Current weight:246.7 lbs    Height:63.5 in Medications: See list Medical History: GERD, hypoglycemia Progress and evaluation: Patient in for medical nutrition therapy follow-up. She was excited to get on the scale because she had weighed at the gym last week and weighed a little less than 243 lbs and today's weight indicates a weight gain of 1 1/2 lbs in past month. This immediately discouraged her and she stated several times, "I am so disappointed. I was sure I had lost weight. I can tell my clothes are looser. She did state that she is just now finishing her "period" and she typically gains weight during that time. She has continued with positive diet choices and states she is more mindful of what she is eating than she has ever been. She tried the low fat, low calorie dessert recipe I gave her and loved it. Her only beverages are water and SF flavored water. She is preparing most of her evening meals at home and does menu planning so she will have foods available to prepare. She has eaten only 1 dessert when eating out in the last month and she ate half one day and half the next. She also looked up the calories to help reinforce why she needs to limit desserts.   Physical activity: no structured exercise in past couple of weeks.  NUTRITION CARE EDUCATION:  Weight control: Acknowledged how frustrating it can be to be making positive diet changes and to have weight gain.  Encouraged her to focus on fact that he clothes are looser and to wait 1 week before weighing again. Reminded her that she has experienced a net loss of 2.6 lbs. Reviewed her present food pattern and typical intake and affirmed that her mindful eating strategies are important for long term success. Also, discussed again how adding a consistent exercise routine will be a key in long term  success.  INTERVENTION:  Continue with previous goals. Continue with mindful eating strategies. Add at least 1 serving of fruit daily. Try again regarding working out at gym or consider exercise or dance videos that can be done at home.  EDUCATION MATERIALS GIVEN:  . Goals/ instructions  LEARNER/ who was taught:  . Patient   LEVEL OF UNDERSTANDING: . Verbalizes understanding LEARNING BARRIERS: . None  WILLINGNESS TO LEARN/READINESS FOR CHANGE: . Eager, change in progress  MONITORING AND EVALUATION:  No follow-up scheduled. Patient states she will call to update me on her progress. Encouraged her to let me know if she wants to schedule another appointment in the future.

## 2016-10-16 ENCOUNTER — Encounter: Payer: Self-pay | Admitting: Family Medicine

## 2016-10-16 ENCOUNTER — Ambulatory Visit (INDEPENDENT_AMBULATORY_CARE_PROVIDER_SITE_OTHER): Payer: Managed Care, Other (non HMO) | Admitting: Family Medicine

## 2016-10-16 VITALS — BP 121/76 | HR 94 | Temp 98.5°F | Resp 18 | Ht 64.0 in | Wt 252.6 lb

## 2016-10-16 DIAGNOSIS — M546 Pain in thoracic spine: Secondary | ICD-10-CM | POA: Diagnosis not present

## 2016-10-16 MED ORDER — CYCLOBENZAPRINE HCL 10 MG PO TABS: 10.0000 mg | ORAL_TABLET | Freq: Three times a day (TID) | ORAL | 0 refills | Status: DC | PRN

## 2016-10-16 NOTE — Progress Notes (Signed)
Name: Virginia Camacho   MRN: [id number]    DOB: 09/30/1981   Date:10/16/2016       Progress Note  Subjective  Chief Complaint  Chief Complaint  Patient presents with  . Back Pain    Upper back    Back Pain  This is a new problem. The current episode started in the past 7 days. The problem occurs constantly. The problem has been gradually improving since onset. The pain is present in the thoracic spine. The quality of the pain is described as shooting. The pain does not radiate. The pain is at a severity of 7/10. The pain is moderate. The symptoms are aggravated by bending. Pertinent negatives include no chest pain or fever. She has tried nothing for the symptoms.    Past Medical History:  Diagnosis Date  . Allergy     Past Surgical History:  Procedure Laterality Date  . KNEE SURGERY Right     Family History  Problem Relation Age of Onset  . Diabetes Mother     Social History   Social History  . Marital status: Single    Spouse name: N/A  . Number of children: N/A  . Years of education: N/A   Occupational History  . Not on file.   Social History Main Topics  . Smoking status: Current Every Day Smoker    Packs/day: 1.00    Types: Cigarettes  . Smokeless tobacco: Never Used  . Alcohol use 0.0 oz/week     Comment: rarely  . Drug use:     Frequency: 1.0 time per week    Types: Marijuana     Comment: occasional   . Sexual activity: Not on file   Other Topics Concern  . Not on file   Social History Narrative  . No narrative on file     Current Outpatient Prescriptions:  .  acetaminophen (RA ACETAMINOPHEN) 650 MG CR tablet, Take 650 mg by mouth every 8 (eight) hours as needed. , Disp: , Rfl:  .  cyclobenzaprine (FLEXERIL) 10 MG tablet, Take 10 mg by mouth 3 (three) times daily as needed. , Disp: , Rfl:  .  mometasone (ELOCON) 0.1 % cream, ELOCON, 0.1% (External Cream)  1 (one) Cream daily for 15 days  Quantity: 1;  Refills: 1   Ordered :01-Mar-2015  Velta AddisonShah,  Aloise Copus Asad MD;  Started 01-Mar-2015 Active, Disp: , Rfl:  .  naproxen (NAPROSYN) 500 MG tablet, Take 1 tablet (500 mg total) by mouth 2 (two) times daily with a meal., Disp: 30 tablet, Rfl: 0 .  Vitamin D, Ergocalciferol, (DRISDOL) 50000 units CAPS capsule, Take 1 capsule (50,000 Units total) by mouth once a week. For 12 weeks (Patient not taking: Reported on 10/16/2016), Disp: 12 capsule, Rfl: 0  Allergies  Allergen Reactions  . Clarithromycin Other (See Comments) and Nausea Only    Hot  . Oxycodone Hives     Review of Systems  Constitutional: Positive for malaise/fatigue. Negative for chills and fever.  Cardiovascular: Negative for chest pain.  Musculoskeletal: Positive for back pain.    Objective  Vitals:   10/16/16 0756  BP: 121/76  Pulse: 94  Resp: 18  Temp: 98.5 F (36.9 C)  TempSrc: Oral  SpO2: 98%  Weight: 252 lb 9.6 oz (114.6 kg)  Height: 5\' 4"  (1.626 m)    Physical Exam  Constitutional: She is oriented to person, place, and time and well-developed, well-nourished, and in no distress.  Musculoskeletal:  Thoracic back: She exhibits tenderness, pain and spasm.       Back:  Tenderness to palpation over the right upper back, associated muscle spasm.  Neurological: She is alert and oriented to person, place, and time.  Psychiatric: Mood, memory, affect and judgment normal.  Nursing note and vitals reviewed.    Assessment & Plan  1. Acute right-sided thoracic back pain Likely from muscular strain, recommended resting, moist heat and muscle relaxant therapy. No imaging indicated at this point - cyclobenzaprine (FLEXERIL) 10 MG tablet; Take 1 tablet (10 mg total) by mouth 3 (three) times daily as needed.  Dispense: 90 tablet; Refill: 0   Denorris Reust Asad A. Faylene KurtzShah [facility] 10/16/2016 8:09 AM

## 2016-10-22 ENCOUNTER — Telehealth: Payer: Self-pay | Admitting: Family Medicine

## 2016-10-22 DIAGNOSIS — G8929 Other chronic pain: Secondary | ICD-10-CM

## 2016-10-22 DIAGNOSIS — M5442 Lumbago with sciatica, left side: Principal | ICD-10-CM

## 2016-10-22 NOTE — Telephone Encounter (Signed)
Patient is checking status on her handicap placard. She dropped the form off on Friday.

## 2016-10-29 NOTE — Telephone Encounter (Signed)
Patient has lower back pain, with radiation into the left leg. In the past she has been diagnosed with degenerative disc disease. MRI of lumbar spine in 2010 showed  L5 nerve root compression. We will recommend getting an x-ray of lumbar spine especially in view of the radicular symptoms.

## 2016-10-29 NOTE — Telephone Encounter (Signed)
Routed to Dr. Shah for patient call back 

## 2016-10-30 NOTE — Telephone Encounter (Signed)
Spoke with patient and she will be seen at outpatient imaging next week

## 2016-11-04 ENCOUNTER — Ambulatory Visit
Admission: RE | Admit: 2016-11-04 | Discharge: 2016-11-04 | Disposition: A | Discharge status: Home / Self Care | Payer: Managed Care, Other (non HMO) | Source: Ambulatory Visit | Attending: Family Medicine | Admitting: Family Medicine

## 2016-11-04 DIAGNOSIS — M5442 Lumbago with sciatica, left side: Secondary | ICD-10-CM | POA: Insufficient documentation

## 2016-11-04 DIAGNOSIS — M47816 Spondylosis without myelopathy or radiculopathy, lumbar region: Secondary | ICD-10-CM | POA: Insufficient documentation

## 2016-11-04 DIAGNOSIS — G8929 Other chronic pain: Secondary | ICD-10-CM | POA: Insufficient documentation

## 2016-11-21 ENCOUNTER — Telehealth: Payer: Self-pay | Admitting: Family Medicine

## 2016-11-21 DIAGNOSIS — M5136 Other intervertebral disc degeneration, lumbar region: Secondary | ICD-10-CM

## 2016-11-21 NOTE — Telephone Encounter (Signed)
Referral to spine specialist is entered, they will be the ones to complete & sign the handicap placard

## 2016-11-21 NOTE — Telephone Encounter (Signed)
Patient states that she did her xray and received her test results, however, Dr Sherryll BurgerShah was going to send her to a spine specialist and as of today she has not heard anything. Also, patient state that you guys had discussed her handi-cap placard and she has not received it.

## 2016-11-25 NOTE — Telephone Encounter (Signed)
Tisha [facility] on Friday 11/22/16

## 2016-12-10 ENCOUNTER — Other Ambulatory Visit: Payer: Self-pay | Admitting: Neurological Surgery

## 2016-12-10 DIAGNOSIS — M47816 Spondylosis without myelopathy or radiculopathy, lumbar region: Secondary | ICD-10-CM

## 2016-12-24 ENCOUNTER — Ambulatory Visit
Admission: RE | Admit: 2016-12-24 | Discharge: 2016-12-24 | Disposition: A | Discharge status: Home / Self Care | Payer: Managed Care, Other (non HMO) | Source: Ambulatory Visit | Attending: Neurological Surgery | Admitting: Neurological Surgery

## 2016-12-24 DIAGNOSIS — M545 Low back pain: Secondary | ICD-10-CM | POA: Insufficient documentation

## 2016-12-24 DIAGNOSIS — M5136 Other intervertebral disc degeneration, lumbar region: Secondary | ICD-10-CM | POA: Diagnosis not present

## 2016-12-24 DIAGNOSIS — M47816 Spondylosis without myelopathy or radiculopathy, lumbar region: Secondary | ICD-10-CM

## 2017-05-07 ENCOUNTER — Other Ambulatory Visit: Payer: Self-pay | Admitting: Family Medicine

## 2017-05-07 ENCOUNTER — Encounter: Payer: Self-pay | Admitting: Family Medicine

## 2017-05-07 ENCOUNTER — Telehealth: Payer: Self-pay | Admitting: Family Medicine

## 2017-05-07 ENCOUNTER — Ambulatory Visit (INDEPENDENT_AMBULATORY_CARE_PROVIDER_SITE_OTHER): Payer: Managed Care, Other (non HMO) | Admitting: Family Medicine

## 2017-05-07 VITALS — BP 122/82 | HR 89 | Temp 98.3°F | Resp 16 | Ht 64.0 in | Wt 256.0 lb

## 2017-05-07 DIAGNOSIS — Z124 Encounter for screening for malignant neoplasm of cervix: Secondary | ICD-10-CM | POA: Diagnosis not present

## 2017-05-07 DIAGNOSIS — Z6841 Body Mass Index (BMI) 40.0 and over, adult: Secondary | ICD-10-CM | POA: Diagnosis not present

## 2017-05-07 DIAGNOSIS — Q838 Other congenital malformations of breast: Secondary | ICD-10-CM

## 2017-05-07 DIAGNOSIS — L309 Dermatitis, unspecified: Secondary | ICD-10-CM | POA: Diagnosis not present

## 2017-05-07 DIAGNOSIS — Z1231 Encounter for screening mammogram for malignant neoplasm of breast: Secondary | ICD-10-CM | POA: Diagnosis not present

## 2017-05-07 DIAGNOSIS — N6489 Other specified disorders of breast: Secondary | ICD-10-CM

## 2017-05-07 DIAGNOSIS — Z01419 Encounter for gynecological examination (general) (routine) without abnormal findings: Secondary | ICD-10-CM

## 2017-05-07 DIAGNOSIS — Z0001 Encounter for general adult medical examination with abnormal findings: Secondary | ICD-10-CM | POA: Diagnosis not present

## 2017-05-07 DIAGNOSIS — Z1239 Encounter for other screening for malignant neoplasm of breast: Secondary | ICD-10-CM

## 2017-05-07 MED ORDER — MOMETASONE FUROATE 0.1 % EX CREA: TOPICAL_CREAM | CUTANEOUS | 0 refills | Status: DC

## 2017-05-07 MED ORDER — NALTREXONE-BUPROPION HCL ER 8-90 MG PO TB12: 2.0000 | ORAL_TABLET | Freq: Two times a day (BID) | ORAL | 0 refills | Status: DC

## 2017-05-07 NOTE — Progress Notes (Signed)
Name: Virginia Camacho   MRN: [id number]    DOB: 11-01-1981   Date:05/07/2017       Progress Note  Subjective  Chief Complaint  Chief Complaint  Patient presents with  . Annual Exam    CPE    HPI  Pt. Presents for complete physical exam.  She is due for breast cancer screening and cervical cancer screening.   Obesity: Patient presents to discuss obesity, she is considered morbidly obese, BMI of 43.94, she has tried dietary (stopped drinking sodas, limited juice intake, limit fried foods to 3x/week, still likes to eat sweets and bread) and lifestyle (she is physically active but has to limit because of her chronic low back pain). She is interested in pharmacotherapeutic weight loss management. She has not tried any meds for weight loss.      Past Medical History:  Diagnosis Date  . Allergy     Past Surgical History:  Procedure Laterality Date  . KNEE SURGERY Right     Family History  Problem Relation Age of Onset  . Diabetes Mother     Social History   Social History  . Marital status: Single    Spouse name: N/A  . Number of children: N/A  . Years of education: N/A   Occupational History  . Not on file.   Social History Main Topics  . Smoking status: Current Every Day Smoker    Packs/day: 1.00    Types: Cigarettes  . Smokeless tobacco: Never Used  . Alcohol use 0.0 oz/week     Comment: rarely  . Drug use: Yes    Frequency: 1.0 time per week    Types: Marijuana     Comment: occasional   . Sexual activity: Not on file   Other Topics Concern  . Not on file   Social History Narrative  . No narrative on file     Current Outpatient Prescriptions:  .  acetaminophen (RA ACETAMINOPHEN) 650 MG CR tablet, Take 650 mg by mouth every 8 (eight) hours as needed. , Disp: , Rfl:  .  cyclobenzaprine (FLEXERIL) 10 MG tablet, Take 1 tablet (10 mg total) by mouth 3 (three) times daily as needed., Disp: 90 tablet, Rfl: 0 .  mometasone (ELOCON) 0.1 % cream, ELOCON,  0.1% (External Cream)  1 (one) Cream daily for 15 days  Quantity: 1;  Refills: 1   Ordered :01-Mar-2015  Velta AddisonShah, Mykhia Danish Asad MD;  Started 01-Mar-2015 Active, Disp: , Rfl:  .  naproxen (NAPROSYN) 500 MG tablet, Take 1 tablet (500 mg total) by mouth 2 (two) times daily with a meal., Disp: 30 tablet, Rfl: 0 .  Vitamin D, Ergocalciferol, (DRISDOL) 50000 units CAPS capsule, Take 1 capsule (50,000 Units total) by mouth once a week. For 12 weeks (Patient not taking: Reported on 10/16/2016), Disp: 12 capsule, Rfl: 0  Allergies  Allergen Reactions  . Clarithromycin Other (See Comments) and Nausea Only    Hot  . Oxycodone Hives     Review of Systems  Constitutional: Negative for chills, fever and malaise/fatigue.  HENT: Negative for congestion, ear pain and sore throat.   Eyes: Negative for blurred vision and double vision.  Respiratory: Positive for cough (on and off coughing). Negative for sputum production and shortness of breath.   Cardiovascular: Negative for chest pain and leg swelling.  Gastrointestinal: Negative for abdominal pain, blood in stool, constipation, diarrhea, nausea and vomiting.  Genitourinary: Negative for dysuria and hematuria.  Musculoskeletal: Positive for back pain (chronic intermittent low  back pain). Negative for joint pain and neck pain.  Neurological: Positive for headaches (occasional HAs). Negative for dizziness.  Psychiatric/Behavioral: Negative for depression. The patient is not nervous/anxious and does not have insomnia.       Objective  Vitals:   05/07/17 1416  BP: 122/82  Pulse: 89  Resp: 16  Temp: 98.3 F (36.8 C)  TempSrc: Oral  SpO2: 97%  Weight: 256 lb (116.1 kg)  Height: 5\' 4"  (1.626 m)    Physical Exam  Constitutional: She is oriented to person, place, and time and well-developed, well-nourished, and in no distress.  HENT:  Head: Normocephalic and atraumatic.  Right Ear: External ear normal.  Left Ear: External ear normal.  Mouth/Throat:  Oropharynx is clear and moist.  Cardiovascular: Normal rate, regular rhythm and normal heart sounds.   No murmur heard. Pulmonary/Chest: Effort normal and breath sounds normal. She has no wheezes. Right breast exhibits no inverted nipple, no mass, no nipple discharge, no skin change and no tenderness. Left breast exhibits no inverted nipple, no mass, no nipple discharge, no skin change and no tenderness.    An area of irregularity in the left breast between 3-4 o' clock, no true mass palpated, no overlying skin changes, nipple is normal.  Abdominal: Soft. Bowel sounds are normal. There is no tenderness.  Genitourinary: Vagina normal, uterus normal, cervix normal, right adnexa normal and left adnexa normal. Cervix exhibits no lesion. Right adnexum displays no mass. Left adnexum displays no mass.  Musculoskeletal: She exhibits no edema.       Right ankle: She exhibits no swelling.       Left ankle: She exhibits no swelling.  Neurological: She is alert and oriented to person, place, and time.  Skin: Skin is warm, dry and intact. Rash noted. Rash is macular.     Fine macular mildly erythematous rash over the left arm,   Psychiatric: Mood, memory, affect and judgment normal.  Nursing note and vitals reviewed.     Assessment & Plan   1. Well woman exam with routine gynecological exam - CBC with Differential/Platelet - COMPLETE METABOLIC PANEL WITH GFR - Lipid panel - VITAMIN D 25 Hydroxy (Vit-D Deficiency, Fractures) - TSH  2. Screening for breast cancer Abnormality as noted in the right breast obtain screening mammogram and follow-up - MM Digital Screening; Future  3. Screening for cervical cancer  - Pap IG and HPV (high risk) DNA detection  4. Morbid obesity with BMI of 40.0-44.9, adult (HCC) Start on Contrave for pharmacotherapy management of obesity. Advised to do no dietary and lifestyle changes, reassess in 3 months - Naltrexone-Bupropion HCl ER 8-90 MG TB12; Take 2 tablets  by mouth 2 (two) times daily. Start: 1 tab PO qam x 1wk, then 1 tab PO bid x 1wk, then 2 tabsPO qam and 1 tab PO qpm x 1 wk;  Dispense: 360 tablet; Refill: 0  5. Dermatitis - mometasone (ELOCON) 0.1 % cream; Apply to affected area once daily or as needed.  Dispense: 45 g; Refill: 0   Edmund Rick Asad A. Faylene Kurtz [facility] 05/07/2017 2:24 PM

## 2017-05-07 NOTE — Telephone Encounter (Signed)
Pt called [facility] to schedule her mammagram appointment and they told her that it need to be changed to diagnostic ultrasound.

## 2017-05-08 DIAGNOSIS — N6489 Other specified disorders of breast: Secondary | ICD-10-CM | POA: Insufficient documentation

## 2017-05-08 DIAGNOSIS — Q838 Other congenital malformations of breast: Secondary | ICD-10-CM | POA: Insufficient documentation

## 2017-05-08 LAB — CBC WITH DIFFERENTIAL/PLATELET
BASOS ABS: 70 {cells}/uL (ref 0–200)
BASOS PCT: 1 %
EOS PCT: 1 %
Eosinophils Absolute: 70 cells/uL (ref 15–500)
HCT: 41.6 % (ref 35.0–45.0)
Hemoglobin: 13 g/dL (ref 11.7–15.5)
Lymphocytes Relative: 44 %
Lymphs Abs: 3080 cells/uL (ref 850–3900)
MCH: 24.8 pg — AB (ref 27.0–33.0)
MCHC: 31.3 g/dL — ABNORMAL LOW (ref 32.0–36.0)
MCV: 79.2 fL — ABNORMAL LOW (ref 80.0–100.0)
MONOS PCT: 6 %
MPV: 9.6 fL (ref 7.5–12.5)
Monocytes Absolute: 420 cells/uL (ref 200–950)
NEUTROS ABS: 3360 {cells}/uL (ref 1500–7800)
Neutrophils Relative %: 48 %
PLATELETS: 323 10*3/uL (ref 140–400)
RBC: 5.25 MIL/uL — ABNORMAL HIGH (ref 3.80–5.10)
RDW: 15.5 % — ABNORMAL HIGH (ref 11.0–15.0)
WBC: 7 10*3/uL (ref 3.8–10.8)

## 2017-05-08 LAB — COMPLETE METABOLIC PANEL WITH GFR
ALT: 12 U/L (ref 6–29)
AST: 12 U/L (ref 10–30)
Albumin: 3.9 g/dL (ref 3.6–5.1)
Alkaline Phosphatase: 41 U/L (ref 33–115)
BILIRUBIN TOTAL: 0.4 mg/dL (ref 0.2–1.2)
BUN: 14 mg/dL (ref 7–25)
CHLORIDE: 104 mmol/L (ref 98–110)
CO2: 23 mmol/L (ref 20–31)
Calcium: 9.2 mg/dL (ref 8.6–10.2)
Creat: 0.84 mg/dL (ref 0.50–1.10)
GFR, Est African American: >89 mL/min (ref >=60)
GLUCOSE: 96 mg/dL (ref 65–99)
Potassium: 4.7 mmol/L (ref 3.5–5.3)
SODIUM: 138 mmol/L (ref 135–146)
TOTAL PROTEIN: 6.9 g/dL (ref 6.1–8.1)

## 2017-05-08 LAB — PAP IG AND HPV HIGH-RISK: HPV DNA High Risk: NOT DETECTED

## 2017-05-08 LAB — LIPID PANEL
CHOL/HDL RATIO: 5 ratio — AB (ref <5.0)
Cholesterol: 204 mg/dL — ABNORMAL HIGH (ref <200)
HDL: 41 mg/dL — ABNORMAL LOW (ref >50)
LDL CALC: 145 mg/dL — AB (ref <100)
Triglycerides: 89 mg/dL (ref <150)
VLDL: 18 mg/dL (ref <30)

## 2017-05-08 LAB — VITAMIN D 25 HYDROXY (VIT D DEFICIENCY, FRACTURES): VIT D 25 HYDROXY: 14 ng/mL — AB (ref 30–100)

## 2017-05-08 LAB — TSH: TSH: 0.72 mIU/L

## 2017-05-08 NOTE — Telephone Encounter (Signed)
Please order diagnostic, Pt called [facility] to schedule her mammagram appointment and they told her that it need to be changed to diagnostic ultrasound

## 2017-05-08 NOTE — Telephone Encounter (Signed)
Ultrasound of right and left breast has been ordered. [facility]

## 2017-05-09 ENCOUNTER — Telehealth: Payer: Self-pay | Admitting: Family Medicine

## 2017-05-09 NOTE — Telephone Encounter (Signed)
PT ASKING IF YOU WILL CALL HER. SHE SAID THAT THE ORDER THAT THE DR PUT IN WAS FOR A MAMMO BUT [facility] SAID THAT IT NEEDED TO BE FOR AN U/S OF BREAST.

## 2017-05-13 NOTE — Telephone Encounter (Signed)
Pt called again asking for a call back so she can get this US of her breast. Please call her back.

## 2017-05-15 ENCOUNTER — Telehealth: Payer: Self-pay | Admitting: Family Medicine

## 2017-05-15 ENCOUNTER — Telehealth: Payer: Self-pay

## 2017-05-15 DIAGNOSIS — N6459 Other signs and symptoms in breast: Secondary | ICD-10-CM

## 2017-05-15 MED ORDER — VITAMIN D (ERGOCALCIFEROL) 1.25 MG (50000 UNIT) PO CAPS: 50000.0000 [IU] | ORAL_CAPSULE | ORAL | 0 refills | Status: DC

## 2017-05-15 NOTE — Telephone Encounter (Signed)
Dr. Sherryll BurgerShah please update referral; PT IS SAYING THAT SHE JUST SPOKE WITH [facility] AFTER SPEAKING WITH NURSE AND THEY SAID THAT THEY NEED AN ORDER FOR BREAST MAMMO ORDER AND U/S ORDER. THEY NEED YOU TO CALL THEM AT [facility] IF ANY QUESTIONS.

## 2017-05-15 NOTE — Telephone Encounter (Signed)
PT IS SAYING THAT SHE JUST SPOKE WITH [facility] AFTER SPEAKING WITH NURSE AND THEY SAID THAT THEY NEED AN ORDER FOR BREAST MAMMO ORDER AND U/S ORDER. THEY NEED YOU TO CALL THEM AT [facility] IF ANY QUESTIONS.

## 2017-05-15 NOTE — Addendum Note (Signed)
Addended bySherryll Burger: Breena Bevacqua, Kathi LudwigSYED ASAD on: 05/15/2017 05:16 PM   Modules accepted: Orders

## 2017-05-15 NOTE — Telephone Encounter (Signed)
Order for diagnostic ultrasound  for left and right breast is placed

## 2017-05-15 NOTE — Telephone Encounter (Signed)
Patient has been notified of lab results and a prescription for vitamin D3 50,000 units take 1 capsule once a week x12 weeks has been sent to [facility] [address] per Dr. Shah, patient has been notified and verbalized understanding  

## 2017-05-22 ENCOUNTER — Other Ambulatory Visit: Payer: Self-pay | Admitting: Family Medicine

## 2017-05-22 ENCOUNTER — Telehealth: Payer: Self-pay

## 2017-05-22 DIAGNOSIS — N6459 Other signs and symptoms in breast: Secondary | ICD-10-CM

## 2017-05-22 DIAGNOSIS — N6489 Other specified disorders of breast: Secondary | ICD-10-CM

## 2017-05-22 DIAGNOSIS — N6310 Unspecified lump in the right breast, unspecified quadrant: Secondary | ICD-10-CM

## 2017-05-22 DIAGNOSIS — Q838 Other congenital malformations of breast: Secondary | ICD-10-CM

## 2017-05-22 NOTE — Telephone Encounter (Signed)
I contacted this patient to inform her that she has been scheduled to have her mammogram on Friday, May 30, 2017 @ 10[facility], but she stated that she will be out of town so she was given their number to reschedule.

## 2017-05-30 ENCOUNTER — Other Ambulatory Visit: Payer: Managed Care, Other (non HMO)

## 2017-06-05 ENCOUNTER — Ambulatory Visit
Admission: RE | Admit: 2017-06-05 | Discharge: 2017-06-05 | Disposition: A | Discharge status: Home / Self Care | Payer: Managed Care, Other (non HMO) | Source: Ambulatory Visit | Attending: Family Medicine | Admitting: Family Medicine

## 2017-06-05 DIAGNOSIS — N6310 Unspecified lump in the right breast, unspecified quadrant: Secondary | ICD-10-CM | POA: Diagnosis not present

## 2017-06-05 DIAGNOSIS — N6459 Other signs and symptoms in breast: Secondary | ICD-10-CM

## 2017-06-05 DIAGNOSIS — N6489 Other specified disorders of breast: Secondary | ICD-10-CM

## 2017-06-05 DIAGNOSIS — Q838 Other congenital malformations of breast: Secondary | ICD-10-CM

## 2018-12-11 IMAGING — MR MR LUMBAR SPINE W/O CM
4 of 5 series · 24 of 48 positions shown · non-contrast
Comparison: 08/30/2009

CLINICAL DATA: Chronic low back pain.

EXAM:
MRI LUMBAR SPINE WITHOUT CONTRAST
TECHNIQUE: Multiplanar, multisequence MR imaging of the lumbar spine was
performed. No intravenous contrast was administered.

[Series 2: T2 · sagittal · 4.0mm · 0.81mm/px · 6 of 15 slices shown (1 of 2)]
[im 1/15]
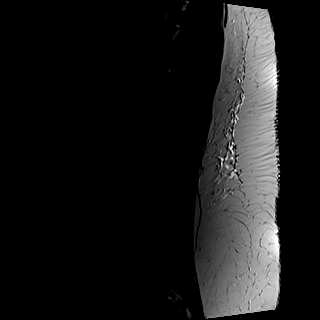
[im 3/15]
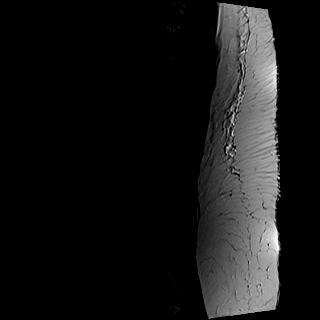
[im 6/15]
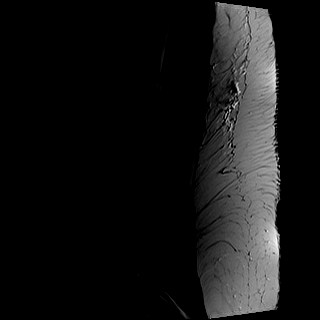
[im 9/15]
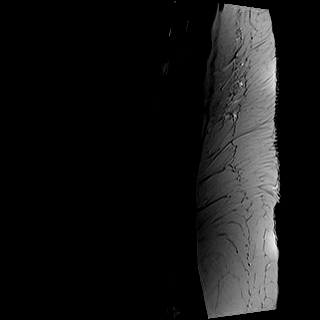
[im 12/15]
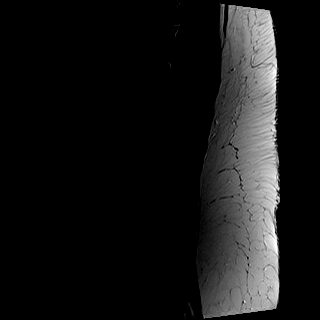
[im 15/15]
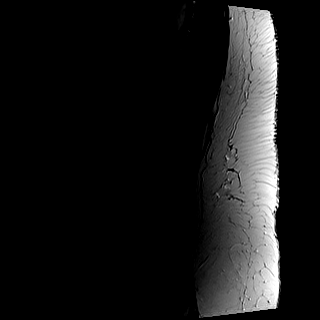

[Series 3: T1 · sagittal · 4.0mm · 0.41mm/px · 6 of 15 slices shown (1 of 2)]
[im 1/15]
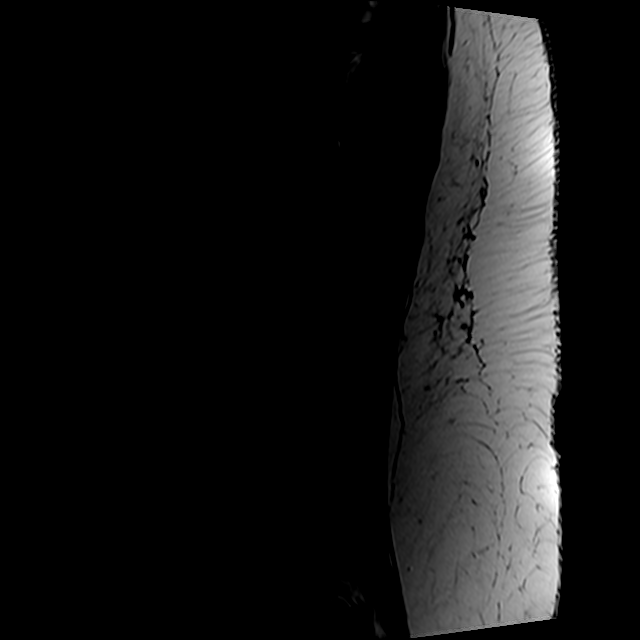
[im 3/15]
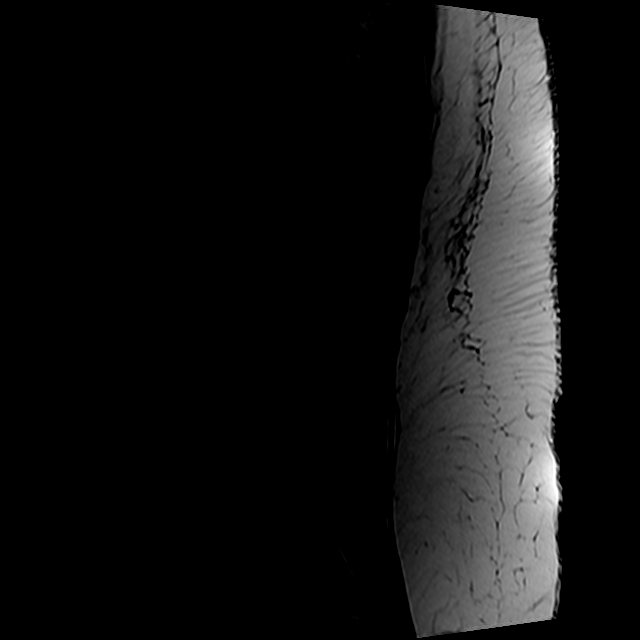
[im 6/15]
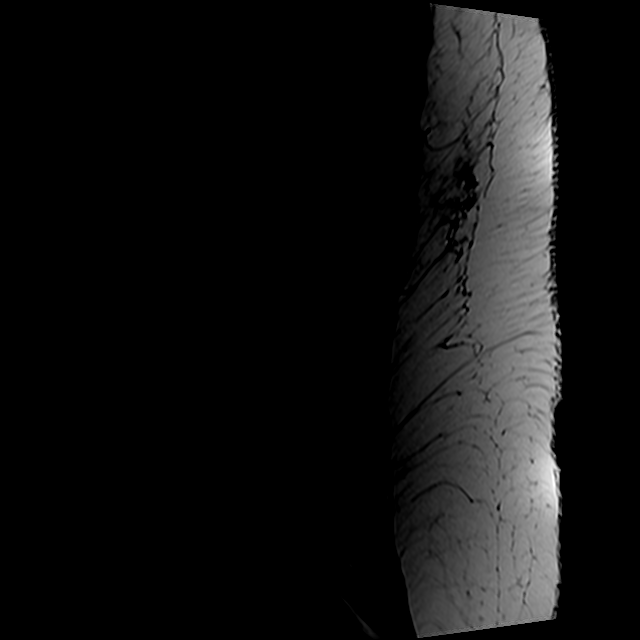
[im 9/15]
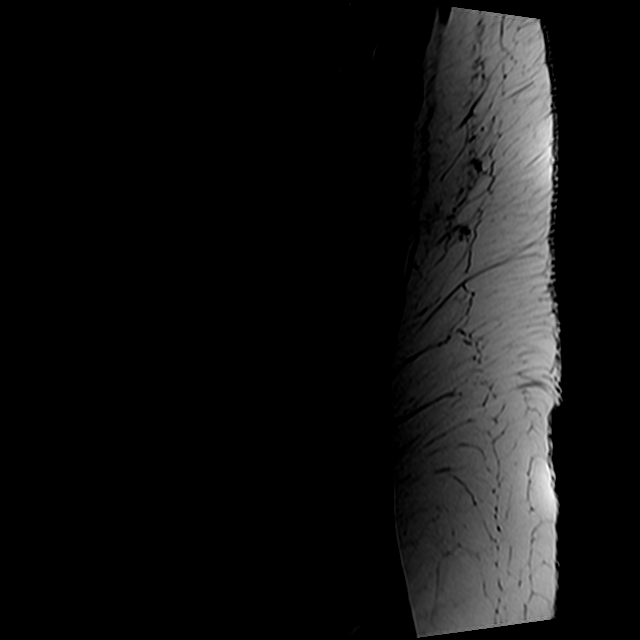
[im 12/15]
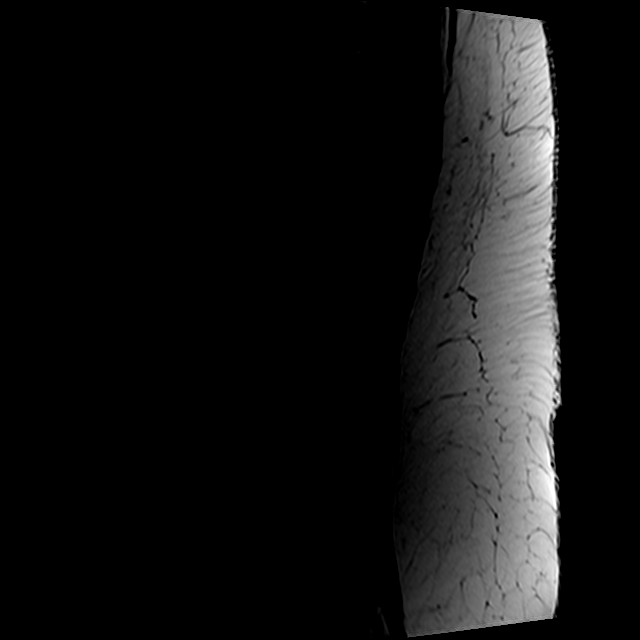
[im 15/15]
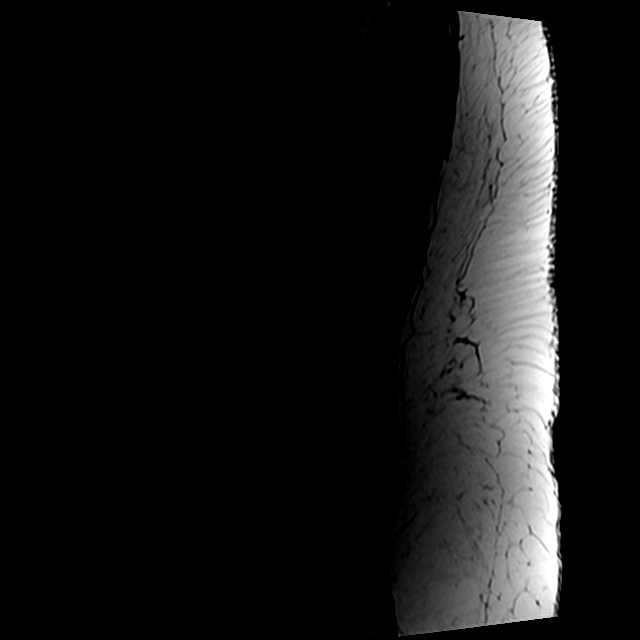

[Series 5: T2 · axial · 4.0mm · 0.78mm/px · z∈[-102,+111]mm · 9 of 39 slices shown (2 of 2)]
[im 1/39]
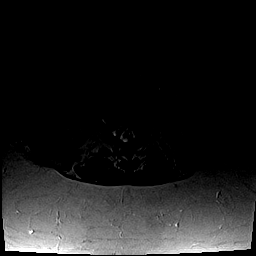
[im 6/39]
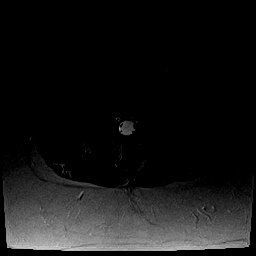
[im 11/39]
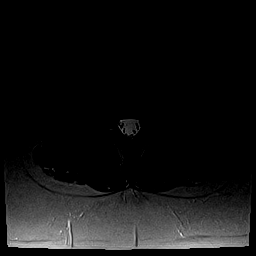
[im 17/39]
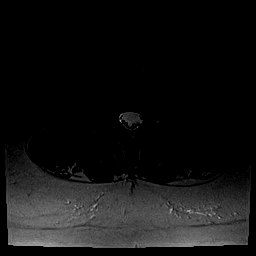
[im 20/39]
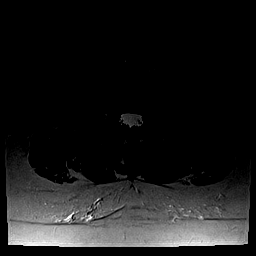
[im 22/39]
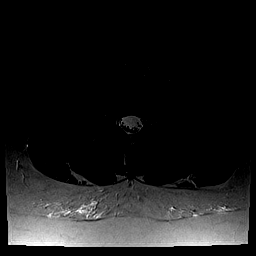
[im 28/39]
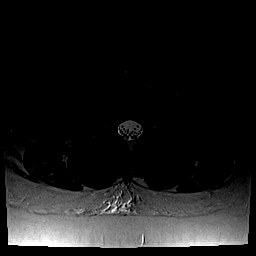
[im 33/39]
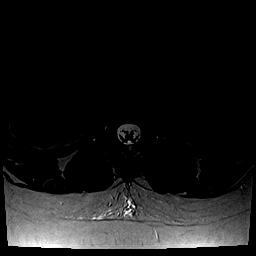
[im 39/39]
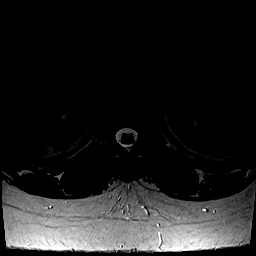

[Series 6: T1 · axial · 4.0mm · 0.31mm/px · z∈[-77,+81]mm · 3 of 39 slices shown (2 of 2)]
[im 6/39]
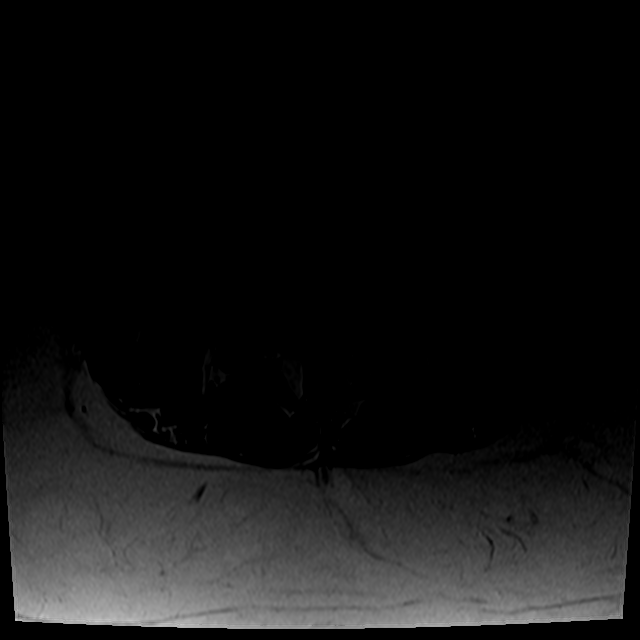
[im 20/39]
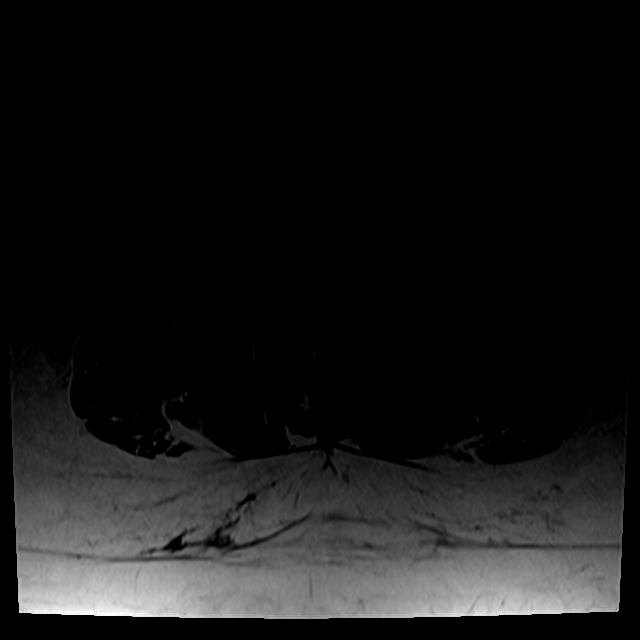
[im 33/39]
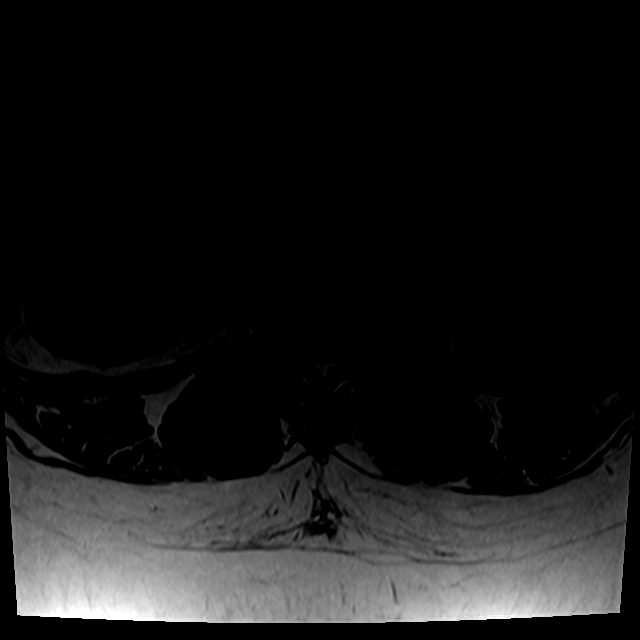

[24 of 48 positions shown; findings below may reference images not displayed]

FINDINGS: Segmentation:  Standard.

Alignment: Minimal left convex curvature of the lumbar spine. No
listhesis.

Vertebrae: Preserved vertebral body heights without evidence of
fracture or osseous lesion. Degenerative endplate changes at L4-5
have progressed, with moderate degenerative marrow edema present in
addition to fatty changes.

Conus medullaris: Extends to the L1 level and appears normal.

Paraspinal and other soft tissues: Unremarkable.

Disc levels:

T12-L1 through L3-4: Negative.

L4-5: Left subarticular disc extrusion on the prior study has
resolved. There is moderate disc space narrowing which has mildly
progressed. Mild disc bulging does not result in significant spinal,
lateral recess, or neural foraminal stenosis.

L5-S1:  Negative.
IMPRESSION: 1. Interval resolution of L4-5 disc extrusion. Residual disc bulging
without stenosis.
2. Progressive degenerative endplate changes at L4-5 including
moderate marrow edema.
3. No evidence of disc herniation or stenosis elsewhere.

## 2019-01-25 ENCOUNTER — Other Ambulatory Visit: Payer: Self-pay

## 2019-01-25 ENCOUNTER — Encounter: Payer: Self-pay | Admitting: Family Medicine

## 2019-01-25 ENCOUNTER — Ambulatory Visit (INDEPENDENT_AMBULATORY_CARE_PROVIDER_SITE_OTHER): Payer: Managed Care, Other (non HMO) | Admitting: Family Medicine

## 2019-01-25 VITALS — BP 126/84 | HR 100 | Temp 97.8°F | Resp 16 | Ht 64.0 in | Wt 252.4 lb

## 2019-01-25 DIAGNOSIS — L309 Dermatitis, unspecified: Secondary | ICD-10-CM | POA: Diagnosis not present

## 2019-01-25 DIAGNOSIS — M5442 Lumbago with sciatica, left side: Secondary | ICD-10-CM

## 2019-01-25 DIAGNOSIS — Z23 Encounter for immunization: Secondary | ICD-10-CM

## 2019-01-25 DIAGNOSIS — E66813 Obesity, class 3: Secondary | ICD-10-CM

## 2019-01-25 DIAGNOSIS — Z1159 Encounter for screening for other viral diseases: Secondary | ICD-10-CM

## 2019-01-25 DIAGNOSIS — M546 Pain in thoracic spine: Secondary | ICD-10-CM

## 2019-01-25 DIAGNOSIS — Z114 Encounter for screening for human immunodeficiency virus [HIV]: Secondary | ICD-10-CM

## 2019-01-25 DIAGNOSIS — R0683 Snoring: Secondary | ICD-10-CM

## 2019-01-25 DIAGNOSIS — M5136 Other intervertebral disc degeneration, lumbar region: Secondary | ICD-10-CM

## 2019-01-25 DIAGNOSIS — G478 Other sleep disorders: Secondary | ICD-10-CM

## 2019-01-25 DIAGNOSIS — M25512 Pain in left shoulder: Secondary | ICD-10-CM

## 2019-01-25 DIAGNOSIS — M51369 Other intervertebral disc degeneration, lumbar region without mention of lumbar back pain or lower extremity pain: Secondary | ICD-10-CM

## 2019-01-25 DIAGNOSIS — Z6841 Body Mass Index (BMI) 40.0 and over, adult: Secondary | ICD-10-CM

## 2019-01-25 DIAGNOSIS — G8929 Other chronic pain: Secondary | ICD-10-CM

## 2019-01-25 MED ORDER — MOMETASONE FUROATE 0.1 % EX CREA: TOPICAL_CREAM | CUTANEOUS | 0 refills | Status: DC

## 2019-01-25 MED ORDER — CYCLOBENZAPRINE HCL 10 MG PO TABS: 10.0000 mg | ORAL_TABLET | Freq: Three times a day (TID) | ORAL | 0 refills | Status: DC | PRN

## 2019-01-25 NOTE — Patient Instructions (Addendum)
Try Melatonin - buy 3mg  tablets, and cut in half, take at the same time every night for 1 week, if not improving, may take full tablet (3mg ).    Sleep Hygiene Tips 1) Get regular. One of the best ways to train your body to sleep well is to go to bed and get up at more or less the same time every day, even on weekends and days off! This regular rhythm will make you feel better and will give your body something to work from. 2) Sleep when sleepy. Only try to sleep when you actually feel tired or sleepy, rather than spending too much time awake in bed. 3) Get up & try again. If you haven't been able to get to sleep after about 20 minutes or more, get up and do something calming or boring until you feel sleepy, then return to bed and try again. Sit quietly on the couch with the lights off (bright light will tell your brain that it is time to wake up), or read something boring like the phone book. Avoid doing anything that is too stimulating or interesting, as this will wake you up even more. 4) Avoid caffeine & nicotine. It is best to avoid consuming any caffeine (in coffee, tea, cola drinks, chocolate, and some medications) or nicotine (cigarettes) for at least 4-6 hours before going to bed. These substances act as stimulants and interfere with the ability to fall asleep 5) Avoid alcohol. It is also best to avoid alcohol for at least 4-6 hours before going to bed. Many people believe that alcohol is relaxing and helps them to get to sleep at first, but it actually interrupts the quality of sleep. 6) Bed is for sleeping. Try not to use your bed for anything other than sleeping and sex, so that your body comes to associate bed with sleep. If you use bed as a place to watch TV, eat, read, work on your laptop, pay bills, and other things, your body will not learn this Connection. 7) No naps. It is best to avoid taking naps during the day, to make sure that you are tired at bedtime. If  you can't make it through the day without a nap, make sure it is for less than an hour and before 3pm. 8) Sleep rituals. You can develop your own rituals of things to remind your body that it is time to sleep - some people find it useful to do relaxing stretches or breathing exercises for 15 minutes before bed each night, or sit calmly with a cup of caffeine-free tea. 9) Bathtime. Having a hot bath 1-2 hours before bedtime can be useful, as it will raise your body temperature, causing you to feel sleepy as your body temperature drops again. Research shows that sleepiness is associated with a drop in body temperature. 10) No clock-watching. Many people who struggle with sleep tend to watch the clock too much. Frequently checking the clock during the night can wake you up (especially if you turn on the light to read the time) and reinforces negative thoughts such as "Oh no, look how late it is, I'll never get to sleep" or "it's so early, I have only slept for 5 hours, this is terrible." 11) Use a sleep diary. This worksheet can be a useful way of making sure you have the right facts about your sleep, rather than making assumptions. Because a diary involves watching the clock (see point 10) it is a good idea to only use  it for two weeks to get an idea of what is going and then perhaps two months down the track to see how you are progressing. 12) Exercise. Regular exercise is a good idea to help with good sleep, but try not to do strenuous exercise in the 4 hours before bedtime. Morning walks are a great way to start the day feeling refreshed! 13) Eat right. A healthy, balanced diet will help you to sleep well, but timing is important. Some people find that a very empty stomach at bedtime is distracting, so it can be useful to have a light snack, but a heavy meal soon before bed can also interrupt sleep. Some people recommend a warm glass of milk, which contains tryptophan, which  acts as a natural sleep inducer. 14) The right space. It is very important that your bed and bedroom are quiet and comfortable for sleeping. A cooler room with enough blankets to stay warm is best, and make sure you have curtains or an eyemask to block out early morning light and earplugs if there is noise outside your room. 15) Keep daytime routine the same. Even if you have a bad night sleep and are tired it is important that you try to keep your daytime activities the same as you had planned. That is, don't avoid activities because you feel tired. This can reinforce the insomnia.   Semaglutide injection solution What is this medicine? SEMAGLUTIDE (Sem a GLOO tide) is used to improve blood sugar control in adults with type 2 diabetes. This medicine may be used with other diabetes medicines. This medicine may be used for other purposes; ask your health care provider or pharmacist if you have questions. COMMON BRAND NAME(S): OZEMPIC What should I tell my health care provider before I take this medicine? They need to know if you have any of these conditions: -endocrine tumors (MEN 2) or if someone in your family had these tumors -eye disease, vision problems -history of pancreatitis -kidney disease -stomach problems -thyroid cancer or if someone in your family had thyroid cancer -an unusual or allergic reaction to semaglutide, other medicines, foods, dyes, or preservatives -pregnant or trying to get pregnant -breast-feeding How should I use this medicine? This medicine is for injection under the skin of your upper leg (thigh), stomach area, or upper arm. It is given once every week (every 7 days). You will be taught how to prepare and give this medicine. Use exactly as directed. Take your medicine at regular intervals. Do not take it more often than directed. If you use this medicine with insulin, you should inject this medicine and the insulin separately. Do not mix them together. Do  not give the injections right next to each other. Change (rotate) injection sites with each injection. It is important that you put your used needles and syringes in a special sharps container. Do not put them in a trash can. If you do not have a sharps container, call your pharmacist or healthcare provider to get one. A special MedGuide will be given to you by the pharmacist with each prescription and refill. Be sure to read this information carefully each time. Talk to your pediatrician regarding the use of this medicine in children. Special care may be needed. Overdosage: If you think you have taken too much of this medicine contact a poison control center or emergency room at once. NOTE: This medicine is only for you. Do not share this medicine with others. What if I miss a dose? If  you miss a dose, take it as soon as you can within 5 days after the missed dose. Then take your next dose at your regular weekly time. If it has been longer than 5 days after the missed dose, do not take the missed dose. Take the next dose at your regular time. Do not take double or extra doses. If you have questions about a missed dose, contact your health care provider for advice. What may interact with this medicine? -other medicines for diabetes Many medications may cause changes in blood sugar, these include: -alcohol containing beverages -antiviral medicines for HIV or AIDS -aspirin and aspirin-like drugs -certain medicines for blood pressure, heart disease, irregular heart beat -chromium -diuretics -female hormones, such as estrogens or progestins, birth control pills -fenofibrate -gemfibrozil -isoniazid -lanreotide -female hormones or anabolic steroids -MAOIs like Carbex, Eldepryl, Marplan, Nardil, and Parnate -medicines for weight loss -medicines for allergies, asthma, cold, or cough -medicines for depression, anxiety, or psychotic disturbances -niacin -nicotine -NSAIDs, medicines for pain and  inflammation, like ibuprofen or naproxen -octreotide -pasireotide -pentamidine -phenytoin -probenecid -quinolone antibiotics such as ciprofloxacin, levofloxacin, ofloxacin -some herbal dietary supplements -steroid medicines such as prednisone or cortisone -sulfamethoxazole; trimethoprim -thyroid hormones Some medications can hide the warning symptoms of low blood sugar (hypoglycemia). You may need to monitor your blood sugar more closely if you are taking one of these medications. These include: -beta-blockers, often used for high blood pressure or heart problems (examples include atenolol, metoprolol, propranolol) -clonidine -guanethidine -reserpine This list may not describe all possible interactions. Give your health care provider a list of all the medicines, herbs, non-prescription drugs, or dietary supplements you use. Also tell them if you smoke, drink alcohol, or use illegal drugs. Some items may interact with your medicine. What should I watch for while using this medicine? Visit your doctor or health care professional for regular checks on your progress. Drink plenty of fluids while taking this medicine. Check with your doctor or health care professional if you get an attack of severe diarrhea, nausea, and vomiting. The loss of too much body fluid can make it dangerous for you to take this medicine. A test called the HbA1C (A1C) will be monitored. This is a simple blood test. It measures your blood sugar control over the last 2 to 3 months. You will receive this test every 3 to 6 months. Learn how to check your blood sugar. Learn the symptoms of low and high blood sugar and how to manage them. Always carry a quick-source of sugar with you in case you have symptoms of low blood sugar. Examples include hard sugar candy or glucose tablets. Make sure others know that you can choke if you eat or drink when you develop serious symptoms of low blood sugar, such as seizures or unconsciousness.  They must get medical help at once. Tell your doctor or health care professional if you have high blood sugar. You might need to change the dose of your medicine. If you are sick or exercising more than usual, you might need to change the dose of your medicine. Do not skip meals. Ask your doctor or health care professional if you should avoid alcohol. Many nonprescription cough and cold products contain sugar or alcohol. These can affect blood sugar. Pens should never be shared. Even if the needle is changed, sharing may result in passing of viruses like hepatitis or HIV. Wear a medical ID bracelet or chain, and carry a card that describes your disease and details of  your medicine and dosage times. What side effects may I notice from receiving this medicine? Side effects that you should report to your doctor or health care professional as soon as possible: -allergic reactions like skin rash, itching or hives, swelling of the face, lips, or tongue -breathing problems -changes in vision -diarrhea that continues or is severe -lump or swelling on the neck -severe nausea -signs and symptoms of infection like fever or chills; cough; sore throat; pain or trouble passing urine -signs and symptoms of low blood sugar such as feeling anxious, confusion, dizziness, increased hunger, unusually weak or tired, sweating, shakiness, cold, irritable, headache, blurred vision, fast heartbeat, loss of consciousness -signs and symptoms of kidney injury like trouble passing urine or change in the amount of urine -trouble swallowing -unusual stomach upset or pain -vomiting Side effects that usually do not require medical attention (report to your doctor or health care professional if they continue or are bothersome): -constipation -diarrhea -nausea -pain, redness, or irritation at site where injected -stomach upset This list may not describe all possible side effects. Call your doctor for medical advice about side  effects. You may report side effects to FDA at 1-800-FDA-1088. Where should I keep my medicine? Keep out of the reach of children. Store unopened pens in a refrigerator between 2 and 8 degrees C (36 and 46 degrees F). Do not freeze. Protect from light and heat. After you first use the pen, it can be stored for 56 days at room temperature between 15 and 30 degrees C (59 and 86 degrees F) or in a refrigerator. Throw away your used pen after 56 days or after the expiration date, whichever comes first. Do not store your pen with the needle attached. If the needle is left on, medicine may leak from the pen. NOTE: This sheet is a summary. It may not cover all possible information. If you have questions about this medicine, talk to your doctor, pharmacist, or health care provider.  2019 [employer] Standard (2016-12-12 14:43:35)

## 2019-01-25 NOTE — Progress Notes (Signed)
New Patient Office Visit  Subjective:  Patient ID: Virginia Camacho, female    DOB: [birth year]  Age: 38 y.o. MRN: [id number]  CC:  Chief Complaint  Patient presents with  . Establish Care  . Back Pain  . Shoulder Pain    left side numbness    HPI Virginia Camacho presents to establish care and for the following concerns:  Acne/Dermatitis: She was using Elocon, but has been out for several months, we will refill today.  She is having an outbreak now because she has been out, but when using regularly, this works well for her. Affected areas include her face.  LEFT shoulder pain: She has long history of LEFT shoulder pain, but over the last month she has had increased numbness/tingling, and discomfort.  She also endorses intermittent weakness.  MRI from 2017 shows "Mild supraspinatus tendinopathy without tear".  Reptitive motions as a chef, is left hand dominant.  Back pain: She has long standing history of lumbar pain, used to take flexeril and hydrocodone.  She is aware of our office policy on opriates, but we will refill flexeril.  She is taking Acetaminophen only at this time for pain and it is not effective.  She does endorse intermittent LEFT leg numbness.  States since her LEFT shoulder has been bothering her she has been sleeping in different positions, and now her hips are painful as well. MRI from 2018 showed: "IMPRESSION: 1. Interval resolution of L4-5 disc extrusion. Residual disc bulging without stenosis. 2. Progressive degenerative endplate changes at G5-4 including moderate marrow edema. 3. No evidence of disc herniation or stenosis elsewhere. Electronically Signed   By: Logan Bores M.D.   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 High stress job.  Does not exercise, not on any particular diet right now. Lengthy education provided. Does well with intermittent fasting, but has  not been doing it recently; struggles with sweets.  No family history of thyroid cancer, no personal history of pancreatitis or thyroid cancer.  Sleep issues/snoring: She took sleeping medication one time and did not like it. Discussed melatonin use.  Falls asleep without issue, but will wake up several times throughout the night.  She will urinate and have BM once in a while, but not constantly.    Health Maintenance:  Tobacoo Use: Occasional cigarette smoker, smokes marijuana - discussed cessation in detail. STI's: Same female partner for 5 years, declines STI screening but will check HIV and Hep C.  TDAP: Need today.  Past Medical History:  Diagnosis Date  . Allergy   . Back pain   . Morbid obesity with BMI of 40.0-44.9, adult ([facility]) 03/20/2016    Past Surgical History:  Procedure Laterality Date  . KNEE SURGERY Right     Family History  Problem Relation Age of Onset  . Diabetes Mother   . Breast cancer Maternal Grandmother     Social History   Socioeconomic History  . Marital status: Single    Spouse name: Not on file  . Number of children: Not on file  . Years of education: Not on file  . Highest education level: Not on file  Occupational History  . Not on file  Social Needs  . Financial resource strain: Not hard at all  . Food insecurity:    Worry: Never true    Inability: Never true  . Transportation needs:  Medical: No    Non-medical: No  Tobacco Use  . Smoking status: Current Every Day Smoker    Packs/day: 1.00    Types: Cigarettes  . Smokeless tobacco: Never Used  Substance and Sexual Activity  . Alcohol use: Yes    Alcohol/week: 0.0 standard drinks    Comment: rarely  . Drug use: Yes    Frequency: 1.0 times per week    Types: Marijuana    Comment: occasional   . Sexual activity: Yes    Partners: Female, Female    Birth control/protection: Condom  Lifestyle  . Physical activity:    Days per week: 0 days    Minutes per session: 0 min  . Stress:  Not at all  Relationships  . Social connections:    Talks on phone: More than three times a week    Gets together: More than three times a week    Attends religious service: 1 to 4 times per year    Active member of club or organization: No    Attends meetings of clubs or organizations: Never    Relationship status: Never married  . Intimate partner violence:    Fear of current or ex partner: No    Emotionally abused: No    Physically abused: No    Forced sexual activity: No  Other Topics Concern  . Not on file  Social History Narrative  . Not on file    ROS Review of Systems  Ten systems reviewed and is negative except as mentioned in HPI  Objective:   Today's Vitals: BP 126/84 (BP Location: Right Arm, Patient Position: Sitting, Cuff Size: Large)   Pulse 100   Temp 97.8 F (36.6 C) (Oral)   Resp 16   Ht '5\' 4"'  (1.626 m)   Wt 252 lb 6.4 oz (114.5 kg)   LMP 12/27/2018   SpO2 99%   BMI 43.32 kg/m   Physical Exam Vitals signs and nursing note reviewed.  Constitutional:      General: She is not in acute distress.    Appearance: She is well-developed.  HENT:     Head: Normocephalic and atraumatic.     Right Ear: External ear normal.     Left Ear: External ear normal.     Nose: Nose normal.     Mouth/Throat:     Pharynx: No oropharyngeal exudate.  Eyes:     Conjunctiva/sclera: Conjunctivae normal.     Pupils: Pupils are equal, round, and reactive to light.  Neck:     Musculoskeletal: Normal range of motion and neck supple.     Vascular: No JVD.  Cardiovascular:     Rate and Rhythm: Normal rate and regular rhythm.     Heart sounds: Normal heart sounds.  Pulmonary:     Effort: Pulmonary effort is normal.     Breath sounds: Normal breath sounds.  Abdominal:     General: Bowel sounds are normal.     Palpations: Abdomen is soft.  Musculoskeletal:     Left shoulder: She exhibits decreased range of motion, tenderness and decreased strength. She exhibits no bony  tenderness, no swelling and no deformity.     Lumbar back: She exhibits decreased range of motion and pain. She exhibits no deformity.  Skin:    General: Skin is warm and dry.     Findings: No rash.  Neurological:     Mental Status: She is alert and oriented to person, place, and time.     Cranial  Nerves: No cranial nerve deficit.  Psychiatric:        Behavior: Behavior normal.        Thought Content: Thought content normal.        Judgment: Judgment normal.    Assessment & Plan:   Problem List Items Addressed This Visit      Nervous and Auditory   Chronic low back pain with left-sided sciatica   Relevant Medications   cyclobenzaprine (FLEXERIL) 10 MG tablet   Other Relevant Orders   Sed Rate (ESR)   C-reactive protein (Completed)   Ambulatory referral to Orthopedics     Musculoskeletal and Integument   Degeneration of intervertebral disc of lumbar region   Relevant Medications   cyclobenzaprine (FLEXERIL) 10 MG tablet   Other Relevant Orders   Sed Rate (ESR)   C-reactive protein (Completed)   Ambulatory referral to Orthopedics   Dermatitis   Relevant Medications   mometasone (ELOCON) 0.1 % cream     Other   Acute pain of left shoulder   Relevant Medications   cyclobenzaprine (FLEXERIL) 10 MG tablet   Other Relevant Orders   Sed Rate (ESR)   C-reactive protein (Completed)   Ambulatory referral to Orthopedics   Class 3 severe obesity due to excess calories with serious comorbidity and body mass index (BMI) of 40.0 to 44.9 in adult [facility])   Relevant Orders   COMPLETE METABOLIC PANEL WITH GFR (Completed)   CBC with Differential/Platelet   Lipid panel (Completed)   Hemoglobin A1c (Completed)   TSH (Completed)    Other Visit Diagnoses    Acute right-sided thoracic back pain    -  Primary   Relevant Medications   cyclobenzaprine (FLEXERIL) 10 MG tablet   Other Relevant Orders   Sed Rate (ESR)   C-reactive protein (Completed)   Wakes up during night        Relevant Orders   CBC with Differential/Platelet   Ambulatory referral to Pulmonology   Loud snoring       Relevant Orders   CBC with Differential/Platelet   Ambulatory referral to Pulmonology   Encounter for screening for HIV       Relevant Orders   HIV Antibody (routine testing w rflx)   Need for hepatitis C screening test       Relevant Orders   Hepatitis C antibody   Need for Tdap vaccination       Relevant Orders   Tdap vaccine greater than or equal to 7yo IM (Completed)     Outpatient Encounter Medications as of 01/25/2019  Medication Sig  . acetaminophen (RA ACETAMINOPHEN) 650 MG CR tablet Take 650 mg by mouth every 8 (eight) hours as needed.   . cyclobenzaprine (FLEXERIL) 10 MG tablet Take 1 tablet (10 mg total) by mouth 3 (three) times daily as needed.  . mometasone (ELOCON) 0.1 % cream Apply to affected area once daily or as needed.  . [DISCONTINUED] cyclobenzaprine (FLEXERIL) 10 MG tablet Take 1 tablet (10 mg total) by mouth 3 (three) times daily as needed. (Patient not taking: Reported on 01/25/2019)  . [DISCONTINUED] mometasone (ELOCON) 0.1 % cream Apply to affected area once daily or as needed. (Patient not taking: Reported on 01/25/2019)  . [DISCONTINUED] Naltrexone-Bupropion HCl ER 8-90 MG TB12 Take 2 tablets by mouth 2 (two) times daily. Start: 1 tab PO qam x 1wk, then 1 tab PO bid x 1wk, then 2 tabsPO qam and 1 tab PO qpm x 1 wk; (Patient not taking: Reported on  01/25/2019)  . [DISCONTINUED] naproxen (NAPROSYN) 500 MG tablet Take 1 tablet (500 mg total) by mouth 2 (two) times daily with a meal. (Patient not taking: Reported on 01/25/2019)  . [DISCONTINUED] Vitamin D, Ergocalciferol, (DRISDOL) 50000 units CAPS capsule Take 1 capsule (50,000 Units total) by mouth once a week. For 12 weeks (Patient not taking: Reported on 10/16/2016)  . [DISCONTINUED] Vitamin D, Ergocalciferol, (DRISDOL) 50000 units CAPS capsule Take 1 capsule (50,000 Units total) by mouth once a week. For 12  weeks (Patient not taking: Reported on 01/25/2019)   No facility-administered encounter medications on file as of 01/25/2019.     Follow-up: Return in about 3 months (around 04/25/2019) for CPE + Follow up - 64mn appt.   EHubbard Hartshorn FNP

## 2019-01-26 LAB — HIV ANTIBODY (ROUTINE TESTING W REFLEX): HIV 1&2 Ab, 4th Generation: NONREACTIVE

## 2019-01-26 LAB — COMPLETE METABOLIC PANEL WITH GFR
AG Ratio: 1.4 (calc) (ref 1.0–2.5)
ALKALINE PHOSPHATASE (APISO): 44 U/L (ref 31–125)
ALT: 15 U/L (ref 6–29)
AST: 15 U/L (ref 10–30)
Albumin: 4.1 g/dL (ref 3.6–5.1)
BILIRUBIN TOTAL: 0.3 mg/dL (ref 0.2–1.2)
BUN: 14 mg/dL (ref 7–25)
CHLORIDE: 106 mmol/L (ref 98–110)
CO2: 22 mmol/L (ref 20–32)
CREATININE: 0.76 mg/dL (ref 0.50–1.10)
Calcium: 9.5 mg/dL (ref 8.6–10.2)
GFR, Est African American: 115 mL/min/{1.73_m2} (ref >=60)
GFR, Est Non African American: 100 mL/min/{1.73_m2} (ref >=60)
GLUCOSE: 84 mg/dL (ref 65–139)
Globulin: 3 g/dL (calc) (ref 1.9–3.7)
Potassium: 4.5 mmol/L (ref 3.5–5.3)
Sodium: 137 mmol/L (ref 135–146)
Total Protein: 7.1 g/dL (ref 6.1–8.1)

## 2019-01-26 LAB — HEMOGLOBIN A1C
HEMOGLOBIN A1C: 5.4 %{Hb} (ref <5.7)
Mean Plasma Glucose: 108 (calc)
eAG (mmol/L): 6 (calc)

## 2019-01-26 LAB — HEPATITIS C ANTIBODY
Hepatitis C Ab: NONREACTIVE
SIGNAL TO CUT-OFF: 0.01 (ref <1.00)

## 2019-01-26 LAB — LIPID PANEL
CHOL/HDL RATIO: 4.5 (calc) (ref <5.0)
CHOLESTEROL: 196 mg/dL (ref <200)
HDL: 44 mg/dL — ABNORMAL LOW (ref >=50)
LDL CHOLESTEROL (CALC): 127 mg/dL — AB
Non-HDL Cholesterol (Calc): 152 mg/dL (calc) — ABNORMAL HIGH (ref <130)
TRIGLYCERIDES: 134 mg/dL (ref <150)

## 2019-01-26 LAB — CBC WITH DIFFERENTIAL/PLATELET
ABSOLUTE MONOCYTES: 311 {cells}/uL (ref 200–950)
BASOS PCT: 0.8 %
Basophils Absolute: 49 cells/uL (ref 0–200)
EOS ABS: 61 {cells}/uL (ref 15–500)
Eosinophils Relative: 1 %
HEMATOCRIT: 41.8 % (ref 35.0–45.0)
HEMOGLOBIN: 13.5 g/dL (ref 11.7–15.5)
LYMPHS ABS: 2898 {cells}/uL (ref 850–3900)
MCH: 25.8 pg — AB (ref 27.0–33.0)
MCHC: 32.3 g/dL (ref 32.0–36.0)
MCV: 79.9 fL — AB (ref 80.0–100.0)
MONOS PCT: 5.1 %
MPV: 11 fL (ref 7.5–12.5)
NEUTROS ABS: 2782 {cells}/uL (ref 1500–7800)
Neutrophils Relative %: 45.6 %
Platelets: 242 10*3/uL (ref 140–400)
RBC: 5.23 10*6/uL — ABNORMAL HIGH (ref 3.80–5.10)
RDW: 14.8 % (ref 11.0–15.0)
Total Lymphocyte: 47.5 %
WBC: 6.1 10*3/uL (ref 3.8–10.8)

## 2019-01-26 LAB — TSH: TSH: 0.67 m[IU]/L

## 2019-01-26 LAB — SEDIMENTATION RATE

## 2019-01-26 LAB — C-REACTIVE PROTEIN: CRP: 5.3 mg/L (ref <8.0)

## 2019-01-26 NOTE — Assessment & Plan Note (Addendum)
Discussed importance of 150 minutes of physical activity weekly, eat two servings of fish weekly, eat one serving of tree nuts ( cashews, pistachios, pecans, almonds.Marland Kitchen) every other day, eat 6 servings of fruit/vegetables daily and drink plenty of water and avoid sweet beverages.  Will consider ozempic and/or Wellbutrin for weight management and smoking cessation after labs are returned.

## 2019-02-18 ENCOUNTER — Encounter: Payer: Self-pay | Admitting: Internal Medicine

## 2019-02-18 ENCOUNTER — Ambulatory Visit (INDEPENDENT_AMBULATORY_CARE_PROVIDER_SITE_OTHER): Payer: Managed Care, Other (non HMO) | Admitting: Internal Medicine

## 2019-02-18 ENCOUNTER — Other Ambulatory Visit: Payer: Self-pay

## 2019-02-18 VITALS — BP 126/70 | HR 114 | Ht 64.0 in | Wt 248.4 lb

## 2019-02-18 DIAGNOSIS — G4719 Other hypersomnia: Secondary | ICD-10-CM | POA: Diagnosis not present

## 2019-02-18 NOTE — Patient Instructions (Signed)

## 2019-02-18 NOTE — Progress Notes (Signed)
[facility] Consultation      Assessment and Plan:  Excessive daytime sleepiness -Symptoms and signs of obstructive sleep apnea. - We will send for sleep study.  Nicotine Abuse.  - Discussed the importance of smoke cessation, spent 3 minutes in discussion.  Is not seriously contemplating quitting at this time.  Orders Placed This Encounter  Procedures  . Home sleep test   Return in about 3 months (around 05/21/2019).   Date: 02/18/2019  MRN# [id number] Virginia Camacho 1981-07-29    Virginia Camacho is a 38 y.o. old female seen in consultation for chief complaint of:    Chief Complaint  Patient presents with  . sleep consult    no prior sleep study, c/oloud snoring, daytime sleepines, restless sleep & occ chokes during sleep. x10y    HPI:  She has had issues for much of her life, since college. She has been in food service. She goes to bed between 9 to MN, wakes at 5 to 6 am and as late as 7 am on weekends.  She snores at night, and occasionally gasps in sleep.  Denies cataplexy, sleep paralysis, jaw pain, TMJ, no dentures.   She takes flexeril occasionally, once in the past week.   She has quit smoking for about 3 years in the past, about 3 years ago, now smoking about less than half ppd.     PMHX:   Past Medical History:  Diagnosis Date  . Allergy   . Back pain   . Morbid obesity with BMI of 40.0-44.9, adult ([facility]) 03/20/2016   Surgical Hx:  Past Surgical History:  Procedure Laterality Date  . KNEE SURGERY Right    Family Hx:  Family History  Problem Relation Age of Onset  . Diabetes Mother   . Breast cancer Maternal Grandmother    Social Hx:   Social History   Tobacco Use  . Smoking status: Current Every Day Smoker    Packs/day: 0.25    Years: 20.00    Pack years: 5.00    Types: Cigarettes  . Smokeless tobacco: Never Used  Substance Use Topics  . Alcohol use: Yes    Alcohol/week: 0.0 standard drinks    Comment: rarely  .  Drug use: Yes    Frequency: 1.0 times per week    Types: Marijuana    Comment: occasional    Medication:    Current Outpatient Medications:  .  acetaminophen (RA ACETAMINOPHEN) 650 MG CR tablet, Take 650 mg by mouth every 8 (eight) hours as needed. , Disp: , Rfl:  .  cyclobenzaprine (FLEXERIL) 10 MG tablet, Take 1 tablet (10 mg total) by mouth 3 (three) times daily as needed., Disp: 90 tablet, Rfl: 0 .  mometasone (ELOCON) 0.1 % cream, Apply to affected area once daily or as needed., Disp: 45 g, Rfl: 0   Allergies:  Clarithromycin and Oxycodone  Review of Systems: Gen:  Denies  fever, sweats, chills HEENT: Denies blurred vision, double vision. bleeds, sore throat Cvc:  No dizziness, chest pain. Resp:   Denies cough or sputum production, shortness of breath Gi: Denies swallowing difficulty, stomach pain. Gu:  Denies bladder incontinence, burning urine Ext:   No Joint pain, stiffness. Skin: No skin rash,  hives  Endoc:  No polyuria, polydipsia. Psych: No depression, insomnia. Other:  All other systems were reviewed with the patient and were negative other that what is mentioned in the HPI.   Physical Examination:   VS: BP 126/70 (BP  Location: Left Arm, Cuff Size: Normal)   Pulse (!) 114   Ht 5\' 4"  (1.626 m)   Wt 248 lb 6.4 oz (112.7 kg)   SpO2 96%   BMI 42.64 kg/m   General Appearance: No distress  Neuro:without focal findings,  speech normal,  HEENT: PERRLA, EOM intact.  Mallampati 3 Pulmonary: normal breath sounds, No wheezing.  CardiovascularNormal S1,S2.  No m/r/g.   Abdomen: Benign, Soft, non-tender. Renal:  No costovertebral tenderness  GU:  No performed at this time. Endoc: No evident thyromegaly, no signs of acromegaly. Skin:   warm, no rashes, no ecchymosis  Extremities: normal, no cyanosis, clubbing.  Other findings:    LABORATORY PANEL:   CBC No results for input(s): WBC, HGB, HCT, PLT in the last 168 hours.  ------------------------------------------------------------------------------------------------------------------  Chemistries  No results for input(s): NA, K, CL, CO2, GLUCOSE, BUN, CREATININE, CALCIUM, MG, AST, ALT, ALKPHOS, BILITOT in the last 168 hours.  Invalid input(s): GFRCGP ------------------------------------------------------------------------------------------------------------------  Cardiac Enzymes No results for input(s): TROPONINI in the last 168 hours. ------------------------------------------------------------  RADIOLOGY:  No results found.     Thank  you for the consultation and for allowing [facility] to assist in the care of your patient. Our recommendations are noted above.  Please contact us if we can be of further service.   Wells Guiles, M.D., F.C.C.P.  Board Certified in Internal Medicine, Pulmonary Medicine, Critical Care Medicine, and Sleep Medicine.  [facility] Office Number: [phone]   02/18/2019

## 2019-03-01 DIAGNOSIS — G4733 Obstructive sleep apnea (adult) (pediatric): Secondary | ICD-10-CM

## 2019-03-03 ENCOUNTER — Telehealth: Payer: Self-pay | Admitting: Internal Medicine

## 2019-03-03 DIAGNOSIS — G4733 Obstructive sleep apnea (adult) (pediatric): Secondary | ICD-10-CM | POA: Diagnosis not present

## 2019-03-03 DIAGNOSIS — G4719 Other hypersomnia: Secondary | ICD-10-CM

## 2019-03-03 NOTE — Telephone Encounter (Signed)
HST preformed on 02/26/19 confirmed mild OSA with AHI of 10. Recommend auto cpap 5-20cm.  Pt is aware of results and voiced her understanding. Pt wishes to proceed with cpap. Order for cpap has been placed. Pt has been scheduled for OV on 06/04/19. Nothing further is needed.

## 2019-04-26 ENCOUNTER — Encounter: Payer: Managed Care, Other (non HMO) | Admitting: Family Medicine

## 2019-06-04 ENCOUNTER — Ambulatory Visit: Payer: Managed Care, Other (non HMO) | Admitting: Internal Medicine

## 2019-11-15 ENCOUNTER — Ambulatory Visit: Payer: Self-pay | Admitting: *Deleted

## 2019-11-15 ENCOUNTER — Other Ambulatory Visit: Payer: Self-pay

## 2019-11-15 DIAGNOSIS — Z20822 Contact with and (suspected) exposure to covid-19: Secondary | ICD-10-CM

## 2019-11-15 NOTE — Telephone Encounter (Signed)
Patient stated she made appointment for tomorrow. Will go to ER if pain persist

## 2019-11-15 NOTE — Telephone Encounter (Signed)
  Pt called in c/o mainly of sinus congestion and pressure around her eyes and body aches.   Denies fever.   She mentioned she is having a very dull chest discomfort more in her upper back.   She denies any other cardiac symptoms.  "I really do not feel like I need to go to the ED".    "I get these sinus infections every year with a mild cough".   The protocol was to be seen within 24 hours   I went over the care advice and let her know when to go to the ED.    She was agreeable.  I warm transferred her call to Cassandra in the office to be scheduled.  I sent my triage notes to the office.   Reason for Disposition . [1] Chest pain lasting < 5 minutes AND [2] NO chest pain or cardiac symptoms (e.g., breathing difficulty, sweating) now  Answer Assessment - Initial Assessment Questions 1. LOCATION: "Where does it hurt?"       I think this pain is from my sinuses.   I feel achy.   I'm having pressure around my eyes.   The chest pain is very dull.   I have a light cough.   No cardiac history.   It's toward my center back.   No fever.  2. RADIATION: "Does the pain go anywhere else?" (e.g., into neck, jaw, arms, back)     No just in my back. 3. ONSET: "When did the chest pain begin?" (Minutes, hours or days)      Last night     Sinus pressure started Saturday. 4. PATTERN "Does the pain come and go, or has it been constant since it started?"  "Does it get worse with exertion?"      It comes and goes. 5. DURATION: "How long does it last" (e.g., seconds, minutes, hours)     *No Answer* 6. SEVERITY: "How bad is the pain?"  (e.g., Scale 1-10; mild, moderate, or severe)    - MILD (1-3): doesn't interfere with normal activities     - MODERATE (4-7): interferes with normal activities or awakens from sleep    - SEVERE (8-10): excruciating pain, unable to do any normal activities       1 on the pain scale. 7. CARDIAC RISK FACTORS: "Do you have any history of heart problems or risk factors for heart  disease?" (e.g., angina, prior heart attack; diabetes, high blood pressure, high cholesterol, smoker, or strong family history of heart disease)      No 8. PULMONARY RISK FACTORS: "Do you have any history of lung disease?"  (e.g., blood clots in lung, asthma, emphysema, birth control pills)     Smoker. 9. CAUSE: "What do you think is causing the chest pain?"     My sinuses 10. OTHER SYMPTOMS: "Do you have any other symptoms?" (e.g., dizziness, nausea, vomiting, sweating, fever, difficulty breathing, cough)       Not short of breath or none of the above. Not coughing up anything. 11. PREGNANCY: "Is there any chance you are pregnant?" "When was your last menstrual period?"       No  Protocols used: CHEST PAIN-A-AH

## 2019-11-15 NOTE — Telephone Encounter (Signed)
Documentation reviewed 

## 2019-11-16 ENCOUNTER — Encounter: Payer: Self-pay | Admitting: Family Medicine

## 2019-11-16 ENCOUNTER — Other Ambulatory Visit: Payer: Self-pay

## 2019-11-16 ENCOUNTER — Ambulatory Visit (INDEPENDENT_AMBULATORY_CARE_PROVIDER_SITE_OTHER): Payer: Managed Care, Other (non HMO) | Admitting: Family Medicine

## 2019-11-16 VITALS — Temp 97.5°F | Ht 64.0 in | Wt 240.0 lb

## 2019-11-16 DIAGNOSIS — Z20822 Contact with and (suspected) exposure to covid-19: Secondary | ICD-10-CM

## 2019-11-16 DIAGNOSIS — J069 Acute upper respiratory infection, unspecified: Secondary | ICD-10-CM

## 2019-11-16 DIAGNOSIS — Z20828 Contact with and (suspected) exposure to other viral communicable diseases: Secondary | ICD-10-CM | POA: Diagnosis not present

## 2019-11-16 LAB — NOVEL CORONAVIRUS, NAA: SARS-CoV-2, NAA: DETECTED — AB

## 2019-11-16 NOTE — Progress Notes (Signed)
Name: Virginia Camacho   MRN: [id number]    DOB: 1981/03/20   Date:11/16/2019       Progress Note  Subjective:    Chief Complaint  Chief Complaint  Patient presents with  . URI    onset 4 days symptoms include: sinus pressure, throat, cold chills, body aches    I connected with  Mariella Saa  on 11/16/19 at  2:20 PM EST by a video enabled telemedicine application and verified that I am speaking with the correct person using two identifiers.  I discussed the limitations of evaluation and management by telemedicine and the availability of in person appointments. The patient expressed understanding and agreed to proceed. Staff also discussed with the patient that there may be a patient responsible charge related to this service. Patient Location: home Provider Location: [facility] clinic Additional Individuals present: none  Pt presents with sinus pressure and drainage onset Sat night about 4 days ago.  URI  This is a new problem. Episode onset: 4d. The problem has been gradually worsening. There has been no fever. Associated symptoms include congestion, coughing (little bit of phlegm ), rhinorrhea, sinus pain and a sore throat. Pertinent negatives include no abdominal pain, chest pain, diarrhea, dysuria, ear pain, headaches, joint pain, joint swelling, nausea, neck pain, plugged ear sensation, rash, sneezing, swollen glands, vomiting or wheezing. Associated symptoms comments: Myalgias, chills. She has tried antihistamine and decongestant (robitussin) for the symptoms. The treatment provided no relief.   Out of Work -  Now until cleared per CDC criteria  Patient Active Problem List   Diagnosis Date Noted  . Class 3 severe obesity due to excess calories with serious comorbidity and body mass index (BMI) of 40.0 to 44.9 in adult (HCC) 01/25/2019  . Breast asymmetry on examination 05/08/2017  . Dermatitis 05/07/2017  . Acute pain of left shoulder 03/20/2016  . Well woman exam with routine  gynecological exam 03/07/2016  . Breast mass, right 03/07/2016  . Pain of right knee after injury 12/20/2015  . Right hand pain 12/20/2015  . Degeneration of intervertebral disc of lumbar region 12/20/2015  . Chronic pain of right knee 12/20/2015  . Headache, tension type, episodic 10/06/2015  . 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  . Headache, migraine 10/06/2015    Social History   Tobacco Use  . Smoking status: Current Every Day Smoker    Packs/day: 0.25    Years: 20.00    Pack years: 5.00    Types: Cigarettes  . Smokeless tobacco: Never Used  Substance Use Topics  . Alcohol use: Yes    Alcohol/week: 0.0 standard drinks    Comment: rarely     Current Outpatient Medications:  .  acetaminophen (RA ACETAMINOPHEN) 650 MG CR tablet, Take 650 mg by mouth every 8 (eight) hours as needed. , Disp: , Rfl:  .  cyclobenzaprine (FLEXERIL) 10 MG tablet, Take 1 tablet (10 mg total) by mouth 3 (three) times daily as needed., Disp: 90 tablet, Rfl: 0 .  mometasone (ELOCON) 0.1 % cream, Apply to affected area once daily or as needed., Disp: 45 g, Rfl: 0  Allergies  Allergen Reactions  . Clarithromycin Other (See Comments) and Nausea Only    Hot  . Oxycodone Hives    I personally reviewed active problem list, medication list, allergies, family history, social history, health maintenance, notes from last encounter, lab results, imaging with the patient/caregiver today.   Review of Systems  Constitutional: Negative.   HENT:  Positive for congestion, rhinorrhea, sinus pain and sore throat. Negative for ear pain and sneezing.   Eyes: Negative.   Respiratory: Positive for cough (little bit of phlegm ). Negative for wheezing.   Cardiovascular: Negative.  Negative for chest pain.  Gastrointestinal: Negative.  Negative for abdominal pain, diarrhea, nausea and vomiting.  Genitourinary: Negative for dysuria.  Musculoskeletal: Negative.  Negative for joint pain and neck  pain.  Skin: Negative.  Negative for rash.  Neurological: Negative.  Negative for headaches.  Endo/Heme/Allergies: Negative.   Psychiatric/Behavioral: Negative.   All other systems reviewed and are negative.    Objective:   Virtual encounter, vitals limited, only able to obtain the following Today's Vitals   11/16/19 1423  Temp: (!) 97.5 F (36.4 C)  Weight: 240 lb (108.9 kg)  Height: 5\' 4"  (1.626 m)   Body mass index is 41.2 kg/m. Nursing Note and Vital Signs reviewed. Physical Exam Vitals and nursing note reviewed.  Constitutional:      Appearance: She is well-developed.  HENT:     Head: Normocephalic and atraumatic.     Nose: Nose normal.  Eyes:     General:        Right eye: No discharge.        Left eye: No discharge.     Conjunctiva/sclera: Conjunctivae normal.  Neck:     Trachea: No tracheal deviation.  Pulmonary:     Effort: Pulmonary effort is normal. No respiratory distress.     Breath sounds: No stridor.     Comments: Mild cough during exam, no audible wheeze stridor or tachypnea Skin:    Coloration: Skin is not jaundiced or pale.     Findings: No rash.  Neurological:     Mental Status: She is alert.     Motor: No abnormal muscle tone.     Coordination: Coordination normal.  Psychiatric:        Behavior: Behavior normal.      PE limited by telephone encounter  No results found for this or any previous visit (from the past 72 hour(s)).  Assessment and Plan:     ICD-10-CM   1. Suspected COVID-19 virus infection  Z20.828 CANCELED: COVID   encouraged testing, isolating, out of work - note given, recommend caution and possibly not return to work with sx even if test neg, f/up as needed  2. Upper respiratory tract infection, unspecified type  J06.9 CANCELED: COVID   other tx is supportive and symptomatic     -Red flags and when to present for emergency care or RTC including fever >101.53F, chest pain, shortness of breath,  new/worsening/un-resolving symptoms,  reviewed with patient at time of visit. Follow up and care instructions discussed and provided in AVS. - I discussed the assessment and treatment plan with the patient. The patient was provided an opportunity to ask questions and all were answered. The patient agreed with the plan and demonstrated an understanding of the instructions.  I provided 13 minutes of non-face-to-face time during this encounter.  Delsa Grana, PA-C 11/16/19 3:08 PM

## 2019-11-17 ENCOUNTER — Encounter: Payer: Self-pay | Admitting: Family Medicine

## 2019-11-17 ENCOUNTER — Encounter: Payer: Self-pay | Admitting: *Deleted

## 2019-11-18 ENCOUNTER — Telehealth: Payer: Self-pay | Admitting: Nurse Practitioner

## 2019-11-18 NOTE — Telephone Encounter (Signed)
Called to Discuss with patient about Covid symptoms and the use of bamlanivimab, a monoclonal antibody infusion for those with mild to moderate Covid symptoms and at a high risk of hospitalization.     Pt is qualified for this infusion at the [facility] infusion center due to co-morbid conditions and/or a member of an at-risk group.  Patient's BMI is 41.20.   Patient declines treatment at this time.

## 2019-12-01 ENCOUNTER — Ambulatory Visit: Payer: Managed Care, Other (non HMO) | Attending: Internal Medicine

## 2019-12-01 DIAGNOSIS — Z20822 Contact with and (suspected) exposure to covid-19: Secondary | ICD-10-CM

## 2019-12-02 LAB — NOVEL CORONAVIRUS, NAA: SARS-CoV-2, NAA: NOT DETECTED

## 2020-08-30 ENCOUNTER — Ambulatory Visit: Payer: Self-pay

## 2020-08-30 NOTE — Telephone Encounter (Signed)
Patient called with c/o off and on dizziness. She says it started on late Monday evening. When she goes to stand up, she says she feels off balance and spinning, so she sits back down for a few minutes and it goes away. She says she bought some off brand motion sickness medicine and it is helping. She denies dizziness at this time, denies any other symptoms. Appointment scheduled for Monday, 09/04/20 at 1000 with Danelle Berry, PA-C, care advice given, patient verbalized understanding.  Reason for Disposition . [1] MILD dizziness (e.g., vertigo; walking normally) AND [2] has NOT been evaluated by physician for this  Answer Assessment - Initial Assessment Questions 1. DESCRIPTION: "Describe your dizziness."     Feel off balance 2. VERTIGO: "Do you feel like either you or the room is spinning or tilting?"      A little 3. LIGHTHEADED: "Do you feel lightheaded?" (e.g., somewhat faint, woozy, weak upon standing)     Yes 4. SEVERITY: "How bad is it?"  "Can you walk?"   - MILD: Feels unsteady but walking normally.   - MODERATE: Feels very unsteady when walking, but not falling; interferes with normal activities (e.g., school, work) .   - SEVERE: Unable to walk without falling, or requires assistance to walk without falling.     Mild 5. ONSET:  "When did the dizziness begin?"     Monday evening 6. AGGRAVATING FACTORS: "Does anything make it worse?" (e.g., standing, change in head position)     Standing 7. CAUSE: "What do you think is causing the dizziness?"     I don't know 8. RECURRENT SYMPTOM: "Have you had dizziness before?" If Yes, ask: "When was the last time?" "What happened that time?"     No 9. OTHER SYMPTOMS: "Do you have any other symptoms?" (e.g., headache, weakness, numbness, vomiting, earache)     No 10. PREGNANCY: "Is there any chance you are pregnant?" "When was your last menstrual period?"       No  Protocols used: DIZZINESS - VERTIGO-A-AH

## 2020-09-04 ENCOUNTER — Ambulatory Visit: Payer: Managed Care, Other (non HMO) | Admitting: Family Medicine

## 2020-09-28 ENCOUNTER — Other Ambulatory Visit: Payer: Self-pay | Admitting: Family Medicine

## 2020-10-13 ENCOUNTER — Encounter: Payer: Managed Care, Other (non HMO) | Admitting: Family Medicine

## 2020-11-21 ENCOUNTER — Encounter: Payer: Self-pay | Admitting: Family Medicine

## 2021-05-18 ENCOUNTER — Encounter: Payer: Self-pay | Admitting: Family Medicine

## 2021-05-31 ENCOUNTER — Encounter: Payer: Self-pay | Admitting: Unknown Physician Specialty

## 2021-05-31 ENCOUNTER — Other Ambulatory Visit: Payer: Self-pay

## 2021-05-31 ENCOUNTER — Ambulatory Visit (INDEPENDENT_AMBULATORY_CARE_PROVIDER_SITE_OTHER): Payer: BC Managed Care – PPO | Admitting: Unknown Physician Specialty

## 2021-05-31 VITALS — BP 124/82 | HR 89 | Temp 99.2°F | Resp 16 | Ht 64.0 in | Wt 260.1 lb

## 2021-05-31 DIAGNOSIS — G4733 Obstructive sleep apnea (adult) (pediatric): Secondary | ICD-10-CM | POA: Diagnosis not present

## 2021-05-31 DIAGNOSIS — L740 Miliaria rubra: Secondary | ICD-10-CM

## 2021-05-31 DIAGNOSIS — Z6841 Body Mass Index (BMI) 40.0 and over, adult: Secondary | ICD-10-CM

## 2021-05-31 DIAGNOSIS — Z0000 Encounter for general adult medical examination without abnormal findings: Secondary | ICD-10-CM | POA: Diagnosis not present

## 2021-05-31 DIAGNOSIS — R252 Cramp and spasm: Secondary | ICD-10-CM

## 2021-05-31 DIAGNOSIS — Z1231 Encounter for screening mammogram for malignant neoplasm of breast: Secondary | ICD-10-CM

## 2021-05-31 DIAGNOSIS — G4730 Sleep apnea, unspecified: Secondary | ICD-10-CM | POA: Insufficient documentation

## 2021-05-31 DIAGNOSIS — L309 Dermatitis, unspecified: Secondary | ICD-10-CM

## 2021-05-31 DIAGNOSIS — E66813 Obesity, class 3: Secondary | ICD-10-CM

## 2021-05-31 MED ORDER — MOMETASONE FUROATE 0.1 % EX CREA: TOPICAL_CREAM | CUTANEOUS | 0 refills | Status: DC

## 2021-05-31 NOTE — Progress Notes (Signed)
BP 124/82   Pulse 89   Temp 99.2 F (37.3 C) (Oral)   Resp 16   Ht 5\' 4"  (1.626 m)   Wt 260 lb 1.6 oz (118 kg)   LMP 05/15/2021   SpO2 99%   BMI 44.65 kg/m    Subjective:    Patient ID: Virginia Camacho, female    DOB: 1981-07-17, 40 y.o.   MRN: [id number]  HPI: [name] is a 40 y.o. female  Pt states she is sleepy during the day.  Positive for sleep apnea but never got the equipment.    Was working out on her bike.  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    Depression screen [facility] 2/9 05/31/2021 11/16/2019 01/25/2019 05/07/2017 10/16/2016  Decreased Interest 0 0 0 0 0  Down, Depressed, Hopeless 0 0 0 0 0  PHQ - 2 Score 0 0 0 0 0  Altered sleeping 1 0 1 - -  Tired, decreased energy 0 0 1 - -  Change in appetite 0 0 1 - -  Feeling bad or failure about yourself  0 0 0 - -  Trouble concentrating 0 0 0 - -  Moving slowly or fidgety/restless 0 0 0 - -  Suicidal thoughts 0 0 0 - -  PHQ-9 Score 1 0 3 - -  Difficult doing work/chores Not difficult at all Not difficult at all Not difficult at all - -     Social History   Socioeconomic History   Marital status: Single    Spouse name: Not on file   Number of children: Not on file   Years of education: Not on file   Highest education level: Not on file  Occupational History   Not on file  Tobacco Use   Smoking status: Every Day    Packs/day: 0.25    Years: 20.00    Pack years: 5.00    Types: Cigarettes   Smokeless tobacco: Never  Vaping Use   Vaping Use: Never used  Substance and Sexual Activity   Alcohol use: Yes    Alcohol/week: 0.0 standard drinks    Comment: rarely   Drug use: Yes    Frequency: 1.0 times per week    Types: Marijuana    Comment: occasional    Sexual activity: Yes    Partners: Female, Female    Birth control/protection: Condom  Other Topics Concern   Not on file  Social History Narrative   Not on file   Social Determinants of Health   Financial Resource Strain: Low Risk    Difficulty of Paying Living  Expenses: Not hard at all  Food Insecurity: No Food Insecurity   Worried About 13/07/2016 in the Last Year: Never true   Ran Out of Food in the Last Year: Never true  Transportation Needs: No Transportation Needs   Lack of Transportation (Medical): No   Lack of Transportation (Non-Medical): No  Physical Activity: Inactive   Days of Exercise per Week: 0 days   Minutes of Exercise per Session: 0 min  Stress: No Stress Concern Present   Feeling of Stress : Only a little  Social Connections: Socially Isolated   Frequency of Communication with Friends and Family: More than three times a week   Frequency of Social Gatherings with Friends and Family: Once a week   Attends Religious Services: Never   Programme researcher, broadcasting/film/video or Organizations: No   Attends Database administrator Meetings: Never   Marital Status:  Never married  Catering manager Violence: Not At Risk   Fear of Current or Ex-Partner: No   Emotionally Abused: No   Physically Abused: No   Sexually Abused: No   Family History  Problem Relation Age of Onset   Diabetes Mother    Breast cancer Maternal Grandmother    Past Medical History:  Diagnosis Date   Allergy    Back pain    Morbid obesity with BMI of 40.0-44.9, adult (HCC) 03/20/2016   Past Surgical History:  Procedure Laterality Date   KNEE SURGERY Right      Relevant past medical, surgical, family and social history reviewed and updated as indicated. Interim medical history since our last visit reviewed. Allergies and medications reviewed and updated.  Review of Systems  Constitutional: Negative.   HENT: Negative.    Cardiovascular: Negative.   Gastrointestinal: Negative.   Endocrine:       Lower libido  Musculoskeletal: Negative.        Hands cramping  Skin:        Heat rash.  Would like a refill of Elocon for the summer  Psychiatric/Behavioral: Negative.     Per HPI unless specifically indicated above     Objective:    BP 124/82   Pulse 89    Temp 99.2 F (37.3 C) (Oral)   Resp 16   Ht 5\' 4"  (1.626 m)   Wt 260 lb 1.6 oz (118 kg)   LMP 05/15/2021   SpO2 99%   BMI 44.65 kg/m   Wt Readings from Last 3 Encounters:  05/31/21 260 lb 1.6 oz (118 kg)  11/16/19 240 lb (108.9 kg)  02/18/19 248 lb 6.4 oz (112.7 kg)    Physical Exam Constitutional:      General: She is not in acute distress.    Appearance: Normal appearance. She is well-developed. She is not ill-appearing.  HENT:     Head: Normocephalic and atraumatic.     Right Ear: Tympanic membrane normal.     Left Ear: Tympanic membrane normal.     Nose: Nose normal.     Mouth/Throat:     Mouth: Mucous membranes are dry.  Eyes:     General: No scleral icterus.       Right eye: No discharge.        Left eye: No discharge.     Pupils: Pupils are equal, round, and reactive to light.  Neck:     Thyroid: No thyromegaly.     Vascular: No carotid bruit.  Cardiovascular:     Rate and Rhythm: Normal rate and regular rhythm.     Heart sounds: Normal heart sounds. No murmur heard.   No friction rub. No gallop.  Pulmonary:     Effort: Pulmonary effort is normal. No respiratory distress.     Breath sounds: Normal breath sounds. No wheezing or rales.  Chest:  Breasts:    Right: Normal. No mass, nipple discharge or skin change.     Left: Normal. No mass, nipple discharge or skin change.  Abdominal:     General: Bowel sounds are normal.     Palpations: Abdomen is soft.     Tenderness: There is no abdominal tenderness. There is no right CVA tenderness, left CVA tenderness or rebound.  Musculoskeletal:        General: Normal range of motion.     Cervical back: Normal range of motion and neck supple.  Lymphadenopathy:     Cervical: No cervical adenopathy.  Skin:  General: Skin is warm and dry.     Findings: No rash.  Neurological:     Mental Status: She is alert and oriented to person, place, and time.  Psychiatric:        Speech: Speech normal.        Behavior:  Behavior normal.        Thought Content: Thought content normal.        Judgment: Judgment normal.    Results for orders placed or performed in visit on 12/01/19  Novel Coronavirus, NAA ([facility])   Specimen: Nasopharyngeal(NP) swabs in vial transport medium   NASOPHARYNGE  TESTING  Result Value Ref Range   SARS-CoV-2, NAA Not Detected Not Detected      Assessment & Plan:   Problem List Items Addressed This Visit       Unprioritized   Class 3 severe obesity due to excess calories with serious comorbidity and body mass index (BMI) of 40.0 to 44.9 in adult [facility])    Restart cycling and start writing down everything she eats.  Stop sweets.         Dermatitis   Relevant Medications   mometasone (ELOCON) 0.1 % cream   Sleep apnea - Primary    Sleep study done a couple of years ago.  Never got CPAP.  Order written to get one       Other Visit Diagnoses     Encounter for screening mammogram for malignant neoplasm of breast       Relevant Orders   MM DIGITAL SCREENING BILATERAL   Routine general medical examination at a health care facility       Relevant Orders   CBC with Differential/Platelet   COMPLETE METABOLIC PANEL WITH GFR   Hemoglobin A1c   Lipid panel   TSH   VITAMIN D 25 Hydroxy (Vit-D Deficiency, Fractures)   Hand cramps       Take Magnesium 1 hour before bed   Heat rash       Restart Elecon prn         Follow up plan: Return in about 1 year (around 05/31/2022), or if symptoms worsen or fail to improve.

## 2021-05-31 NOTE — Assessment & Plan Note (Addendum)
Sleep study done a couple of years ago.  Never got CPAP.  Order written to get one

## 2021-05-31 NOTE — Assessment & Plan Note (Addendum)
Restart cycling and start writing down everything she eats.  Stop sweets.

## 2021-06-01 LAB — LIPID PANEL
Cholesterol: 229 mg/dL — ABNORMAL HIGH (ref <200)
HDL: 47 mg/dL — ABNORMAL LOW (ref >=50)
LDL Cholesterol (Calc): 161 mg/dL (calc) — ABNORMAL HIGH
Non-HDL Cholesterol (Calc): 182 mg/dL (calc) — ABNORMAL HIGH (ref <130)
Total CHOL/HDL Ratio: 4.9 (calc) (ref <5.0)
Triglycerides: 99 mg/dL (ref <150)

## 2021-06-01 LAB — CBC WITH DIFFERENTIAL/PLATELET
Absolute Monocytes: 451 cells/uL (ref 200–950)
Basophils Absolute: 52 cells/uL (ref 0–200)
Basophils Relative: 0.7 %
Eosinophils Absolute: 104 cells/uL (ref 15–500)
Eosinophils Relative: 1.4 %
HCT: 40.7 % (ref 35.0–45.0)
Hemoglobin: 12.8 g/dL (ref 11.7–15.5)
Lymphs Abs: 2701 cells/uL (ref 850–3900)
MCH: 24.2 pg — ABNORMAL LOW (ref 27.0–33.0)
MCHC: 31.4 g/dL — ABNORMAL LOW (ref 32.0–36.0)
MCV: 76.9 fL — ABNORMAL LOW (ref 80.0–100.0)
MPV: 10.4 fL (ref 7.5–12.5)
Monocytes Relative: 6.1 %
Neutro Abs: 4092 cells/uL (ref 1500–7800)
Neutrophils Relative %: 55.3 %
Platelets: 294 10*3/uL (ref 140–400)
RBC: 5.29 10*6/uL — ABNORMAL HIGH (ref 3.80–5.10)
RDW: 15 % (ref 11.0–15.0)
Total Lymphocyte: 36.5 %
WBC: 7.4 10*3/uL (ref 3.8–10.8)

## 2021-06-01 LAB — COMPLETE METABOLIC PANEL WITH GFR
AG Ratio: 1.4 (calc) (ref 1.0–2.5)
ALT: 12 U/L (ref 6–29)
AST: 13 U/L (ref 10–30)
Albumin: 4 g/dL (ref 3.6–5.1)
Alkaline phosphatase (APISO): 40 U/L (ref 31–125)
BUN: 12 mg/dL (ref 7–25)
CO2: 22 mmol/L (ref 20–32)
Calcium: 9.2 mg/dL (ref 8.6–10.2)
Chloride: 105 mmol/L (ref 98–110)
Creat: 0.73 mg/dL (ref 0.50–1.10)
GFR, Est African American: 119 mL/min/{1.73_m2} (ref >=60)
GFR, Est Non African American: 103 mL/min/{1.73_m2} (ref >=60)
Globulin: 2.9 g/dL (calc) (ref 1.9–3.7)
Glucose, Bld: 77 mg/dL (ref 65–99)
Potassium: 4.7 mmol/L (ref 3.5–5.3)
Sodium: 136 mmol/L (ref 135–146)
Total Bilirubin: 0.6 mg/dL (ref 0.2–1.2)
Total Protein: 6.9 g/dL (ref 6.1–8.1)

## 2021-06-01 LAB — TSH: TSH: 0.6 mIU/L

## 2021-06-01 LAB — HEMOGLOBIN A1C
Hgb A1c MFr Bld: 5.6 % of total Hgb (ref <5.7)
Mean Plasma Glucose: 114 mg/dL
eAG (mmol/L): 6.3 mmol/L

## 2021-06-01 LAB — VITAMIN D 25 HYDROXY (VIT D DEFICIENCY, FRACTURES): Vit D, 25-Hydroxy: 22 ng/mL — ABNORMAL LOW (ref 30–100)

## 2021-06-05 NOTE — Addendum Note (Signed)
Addended by: Gabriel Cirri on: 06/05/2021 01:02 PM   Modules accepted: Level of Service

## 2021-11-30 ENCOUNTER — Telehealth: Payer: Self-pay

## 2021-11-30 NOTE — Telephone Encounter (Signed)
Patient came by the office to inquiry about letter request for an emotional support animal.  I informed patient that she would need an appointment with one of our providers in order for a referral to be placed to psychiatry.  Patient verbalized understanding and made an appointment with Della Goo for 12/07/21.

## 2021-11-30 NOTE — Telephone Encounter (Signed)
Copied from CRM [id number]. Topic: General - Other >> Nov 30, 2021  8:58 AM [place], Hospital doctor R wrote: Reason for CRM: pt calling to see if she can receive a ESA letter. Requesting a call back at [phone].

## 2021-12-07 ENCOUNTER — Other Ambulatory Visit: Payer: Self-pay

## 2021-12-07 ENCOUNTER — Encounter: Payer: Self-pay | Admitting: Nurse Practitioner

## 2021-12-07 ENCOUNTER — Ambulatory Visit (INDEPENDENT_AMBULATORY_CARE_PROVIDER_SITE_OTHER): Payer: Managed Care, Other (non HMO) | Admitting: Nurse Practitioner

## 2021-12-07 VITALS — BP 120/72 | HR 100 | Temp 98.8°F | Resp 16 | Ht 64.0 in | Wt 261.7 lb

## 2021-12-07 DIAGNOSIS — F419 Anxiety disorder, unspecified: Secondary | ICD-10-CM

## 2021-12-07 DIAGNOSIS — F331 Major depressive disorder, recurrent, moderate: Secondary | ICD-10-CM

## 2021-12-07 DIAGNOSIS — Z23 Encounter for immunization: Secondary | ICD-10-CM

## 2021-12-07 NOTE — Progress Notes (Signed)
BP 120/72    Pulse 100    Temp 98.8 F (37.1 C) (Oral)    Resp 16    Ht 5\' 4"  (1.626 m)    Wt 261 lb 11.2 oz (118.7 kg)    SpO2 98%    BMI 44.92 kg/m    Subjective:    Patient ID: Virginia Camacho, female    DOB: August 09, 1981, 40 y.o.   MRN: [id number]  HPI: Virginia Camacho is a 40 y.o. female, here alone  Chief Complaint  Patient presents with   Referral    For Therapist   Anxiety/depression: She says she has had to deal with anxiety and depression most of her life.  She says she used to be in therapy but has not been in therapy for many years.  No suicidal thoughts.  Not currently on medication.  She does not want to take medication, she does want to have an emotional support animal.  Discussed contacting her insurance company to locate a therapist in her area.  She verbalized understanding.  She says she still wants a referral to a psychiatrist.    GAD 7 : Generalized Anxiety Score 12/07/2021 05/31/2021  Nervous, Anxious, on Edge 1 0  Control/stop worrying 2 0  Worry too much - different things 2 0  Trouble relaxing 1 0  Restless 1 0  Easily annoyed or irritable 1 0  Afraid - awful might happen 1 0  Total GAD 7 Score 9 0  Anxiety Difficulty - Not difficult at all     Depression screen [facility] 2/9 12/07/2021 12/07/2021 05/31/2021 11/16/2019 01/25/2019  Decreased Interest 1 0 0 0 0  Down, Depressed, Hopeless 1 0 0 0 0  PHQ - 2 Score 2 0 0 0 0  Altered sleeping 2 - 1 0 1  Tired, decreased energy 2 - 0 0 1  Change in appetite 2 - 0 0 1  Feeling bad or failure about yourself  0 - 0 0 0  Trouble concentrating 1 - 0 0 0  Moving slowly or fidgety/restless 1 - 0 0 0  Suicidal thoughts 0 - 0 0 0  PHQ-9 Score 10 - 1 0 3  Difficult doing work/chores Somewhat difficult - Not difficult at all Not difficult at all Not difficult at all      Relevant past medical, surgical, family and social history reviewed and updated as indicated. Interim medical history since our last visit  reviewed. Allergies and medications reviewed and updated.  Review of Systems  Constitutional: Negative for fever or weight change.  Respiratory: Negative for cough and shortness of breath.   Cardiovascular: Negative for chest pain or palpitations.  Gastrointestinal: Negative for abdominal pain, no bowel changes.  Musculoskeletal: Negative for gait problem or joint swelling.  Skin: Negative for rash.  Neurological: Negative for dizziness or headache.  No other specific complaints in a complete review of systems (except as listed in HPI above).      Objective:    BP 120/72    Pulse 100    Temp 98.8 F (37.1 C) (Oral)    Resp 16    Ht 5\' 4"  (1.626 m)    Wt 261 lb 11.2 oz (118.7 kg)    SpO2 98%    BMI 44.92 kg/m   Wt Readings from Last 3 Encounters:  12/07/21 261 lb 11.2 oz (118.7 kg)  05/31/21 260 lb 1.6 oz (118 kg)  11/16/19 240 lb (108.9 kg)    Physical Exam  Constitutional: Patient appears well-developed and well-nourished. Obese No distress.  HEENT: head atraumatic, normocephalic, pupils equal and reactive to light, neck supple Cardiovascular: Normal rate, regular rhythm and normal heart sounds.  No murmur heard. No BLE edema. Pulmonary/Chest: Effort normal and breath sounds normal. No respiratory distress. Abdominal: Soft.  There is no tenderness. Psychiatric: Patient has a normal mood and affect. behavior is normal. Judgment and thought content normal.   Results for orders placed or performed in visit on 05/31/21  CBC with Differential/Platelet  Result Value Ref Range   WBC 7.4 3.8 - 10.8 Thousand/uL   RBC 5.29 (H) 3.80 - 5.10 Million/uL   Hemoglobin 12.8 11.7 - 15.5 g/dL   HCT 78.9 38.1 - 01.7 %   MCV 76.9 (L) 80.0 - 100.0 fL   MCH 24.2 (L) 27.0 - 33.0 pg   MCHC 31.4 (L) 32.0 - 36.0 g/dL   RDW 51.0 25.8 - 52.7 %   Platelets 294 140 - 400 Thousand/uL   MPV 10.4 7.5 - 12.5 fL   Neutro Abs 4,092 1,500 - 7,800 cells/uL   Lymphs Abs 2,701 850 - 3,900 cells/uL    Absolute Monocytes 451 200 - 950 cells/uL   Eosinophils Absolute 104 15 - 500 cells/uL   Basophils Absolute 52 0 - 200 cells/uL   Neutrophils Relative % 55.3 %   Total Lymphocyte 36.5 %   Monocytes Relative 6.1 %   Eosinophils Relative 1.4 %   Basophils Relative 0.7 %  COMPLETE METABOLIC PANEL WITH GFR  Result Value Ref Range   Glucose, Bld 77 65 - 99 mg/dL   BUN 12 7 - 25 mg/dL   Creat 7.82 4.23 - 5.36 mg/dL   GFR, Est Non African American 103 > OR = 60 mL/min/1.86m2   GFR, Est African American 119 > OR = 60 mL/min/1.86m2   BUN/Creatinine Ratio NOT APPLICABLE 6 - 22 (calc)   Sodium 136 135 - 146 mmol/L   Potassium 4.7 3.5 - 5.3 mmol/L   Chloride 105 98 - 110 mmol/L   CO2 22 20 - 32 mmol/L   Calcium 9.2 8.6 - 10.2 mg/dL   Total Protein 6.9 6.1 - 8.1 g/dL   Albumin 4.0 3.6 - 5.1 g/dL   Globulin 2.9 1.9 - 3.7 g/dL (calc)   AG Ratio 1.4 1.0 - 2.5 (calc)   Total Bilirubin 0.6 0.2 - 1.2 mg/dL   Alkaline phosphatase (APISO) 40 31 - 125 U/L   AST 13 10 - 30 U/L   ALT 12 6 - 29 U/L  Hemoglobin A1c  Result Value Ref Range   Hgb A1c MFr Bld 5.6 <5.7 % of total Hgb   Mean Plasma Glucose 114 mg/dL   eAG (mmol/L) 6.3 mmol/L  Lipid panel  Result Value Ref Range   Cholesterol 229 (H) <200 mg/dL   HDL 47 (L) > OR = 50 mg/dL   Triglycerides 99 <144 mg/dL   LDL Cholesterol (Calc) 161 (H) mg/dL (calc)   Total CHOL/HDL Ratio 4.9 <5.0 (calc)   Non-HDL Cholesterol (Calc) 182 (H) <130 mg/dL (calc)  TSH  Result Value Ref Range   TSH 0.60 mIU/L  VITAMIN D 25 Hydroxy (Vit-D Deficiency, Fractures)  Result Value Ref Range   Vit D, 25-Hydroxy 22 (L) 30 - 100 ng/mL      Assessment & Plan:   1. Moderate episode of recurrent major depressive disorder (HCC) -discussed contacting her insurance company to find a therapist in her area  - Ambulatory referral to Psychiatry  2.  Anxiety -discussed contacting her insurance company to find a therapist in her area  - Ambulatory referral to  Psychiatry  3. Need for influenza vaccination  - Flu Vaccine QUAD 6+ mos PF IM (Fluarix Quad PF)   Follow up plan: Return if symptoms worsen or fail to improve.

## 2022-06-07 ENCOUNTER — Ambulatory Visit: Payer: Managed Care, Other (non HMO) | Admitting: Nurse Practitioner

## 2022-06-14 ENCOUNTER — Ambulatory Visit
Admission: RE | Admit: 2022-06-14 | Discharge: 2022-06-14 | Disposition: A | Discharge status: Home / Self Care | Payer: Managed Care, Other (non HMO) | Source: Ambulatory Visit | Attending: Nurse Practitioner | Admitting: Nurse Practitioner

## 2022-06-14 ENCOUNTER — Ambulatory Visit (INDEPENDENT_AMBULATORY_CARE_PROVIDER_SITE_OTHER): Payer: Managed Care, Other (non HMO) | Admitting: Nurse Practitioner

## 2022-06-14 ENCOUNTER — Encounter: Payer: Self-pay | Admitting: Nurse Practitioner

## 2022-06-14 ENCOUNTER — Ambulatory Visit
Admission: RE | Admit: 2022-06-14 | Discharge: 2022-06-14 | Disposition: A | Discharge status: Home / Self Care | Payer: Managed Care, Other (non HMO) | Attending: Nurse Practitioner | Admitting: Nurse Practitioner

## 2022-06-14 ENCOUNTER — Other Ambulatory Visit: Payer: Self-pay

## 2022-06-14 VITALS — BP 128/72 | HR 98 | Temp 98.2°F | Resp 16 | Ht 64.0 in | Wt 253.5 lb

## 2022-06-14 DIAGNOSIS — F419 Anxiety disorder, unspecified: Secondary | ICD-10-CM | POA: Diagnosis not present

## 2022-06-14 DIAGNOSIS — R0602 Shortness of breath: Secondary | ICD-10-CM

## 2022-06-14 DIAGNOSIS — E559 Vitamin D deficiency, unspecified: Secondary | ICD-10-CM

## 2022-06-14 DIAGNOSIS — Z8616 Personal history of COVID-19: Secondary | ICD-10-CM

## 2022-06-14 DIAGNOSIS — L309 Dermatitis, unspecified: Secondary | ICD-10-CM

## 2022-06-14 DIAGNOSIS — F331 Major depressive disorder, recurrent, moderate: Secondary | ICD-10-CM | POA: Diagnosis not present

## 2022-06-14 DIAGNOSIS — G8929 Other chronic pain: Secondary | ICD-10-CM

## 2022-06-14 DIAGNOSIS — Z6841 Body Mass Index (BMI) 40.0 and over, adult: Secondary | ICD-10-CM

## 2022-06-14 DIAGNOSIS — G4733 Obstructive sleep apnea (adult) (pediatric): Secondary | ICD-10-CM

## 2022-06-14 DIAGNOSIS — Z131 Encounter for screening for diabetes mellitus: Secondary | ICD-10-CM

## 2022-06-14 DIAGNOSIS — E785 Hyperlipidemia, unspecified: Secondary | ICD-10-CM

## 2022-06-14 DIAGNOSIS — E66813 Obesity, class 3: Secondary | ICD-10-CM

## 2022-06-14 DIAGNOSIS — M5442 Lumbago with sciatica, left side: Secondary | ICD-10-CM

## 2022-06-14 DIAGNOSIS — Z130 Encounter for screening for diseases of the blood and blood-forming organs and certain disorders involving the immune mechanism: Secondary | ICD-10-CM

## 2022-06-14 DIAGNOSIS — G4701 Insomnia due to medical condition: Secondary | ICD-10-CM

## 2022-06-14 DIAGNOSIS — R400 Somnolence: Secondary | ICD-10-CM

## 2022-06-14 MED ORDER — PREDNISONE 10 MG (21) PO TBPK: ORAL_TABLET | ORAL | 0 refills | Status: DC

## 2022-06-14 MED ORDER — MOMETASONE FUROATE 0.1 % EX CREA: TOPICAL_CREAM | CUTANEOUS | 2 refills | Status: DC

## 2022-06-14 MED ORDER — ALBUTEROL SULFATE HFA 108 (90 BASE) MCG/ACT IN AERS: 2.0000 | INHALATION_SPRAY | RESPIRATORY_TRACT | 4 refills | Status: DC | PRN

## 2022-06-14 NOTE — Assessment & Plan Note (Signed)
Not currently on any medication.  Patient would like to see a psychiatrist.  Referral placed.

## 2022-06-14 NOTE — Assessment & Plan Note (Signed)
Discussed increasing physical activity as tolerated.

## 2022-06-14 NOTE — Assessment & Plan Note (Signed)
Patient reports she gets heat rashes every summer.  She uses mometasone cream.  Refill sent.

## 2022-06-14 NOTE — Assessment & Plan Note (Signed)
New order placed for CPAP machine.

## 2022-06-14 NOTE — Assessment & Plan Note (Signed)
She reports her back pain has not been bad but she still having left-sided sciatica.  We will do small course of steroids.  May do heat therapy to help as well.

## 2022-06-14 NOTE — Assessment & Plan Note (Signed)
Not currently on any medication.  Patient would like to see a psychiatrist.  Referral placed. 

## 2022-06-14 NOTE — Progress Notes (Signed)
BP 128/72   Pulse 98   Temp 98.2 F (36.8 C) (Oral)   Resp 16   Ht 5\' 4"  (1.626 m)   Wt 253 lb 8 oz (115 kg)   SpO2 95%   BMI 43.51 kg/m    Subjective:    Patient ID: Virginia Camacho, female    DOB: August 05, 1981, 41 y.o.   MRN: [id number]  HPI: Virginia Camacho is a 41 y.o. female  Chief Complaint  Patient presents with   Sciatica    Left side   Breathing Problem    Since having Covid.   Depression    6 month follow up   Low back pain with left side sciatica: She says her back pain is doing okay. She says her sciatica has been acting up.  She denies any urinary complaints,  denies any incontinence. Will give course of steroids.  He was heat therapy to help with symptoms.  Shortness of breath: She says she has been having shortness of breath since having Covid.  She says she had covid in 11/2019. She says that she has been having noticeable increase in shortness of breath since then. She says that her girl friend has noticed that she is wheezing. She says she stopped smoking about a year ago.  She denies any fever.  Walking pulse ox dropped to 91 % with a heart rate of 135. Discussed she has symptoms of long covid.  Will start with sample of budesonide inhaler and prescription for albuterol inhaler. Will get chest xray due to history of smoking.  If no improvement with treatment will refer to pulmonology.  Sleep apnea/insomnia/daytime sleepiness: Sleep Study was done on 02/26/2019. She has not gotten any equipment will be looking in to that.  She says she wakes up multiple times a night gasping for air. She says since she is not sleeping well at night she is tired during the day.  We will place new order for CPAP and supplies.  Vitamin d deficiency: Her last vitamin D was 22 on 05/31/2021.  She is not taking a vitamin D supplement regularly.  We will get labs today.  Hyperlipidemia: Her last LDL was 161 on 05/31/2021.  She is not currently on any cholesterol medication.  We will get  labs today.  Obesity: Her current weight is 253 lbs with a BMI of 43.51. Not currently exercising.  Discussed slowly increasing physical activity.   Anxiety/depression: Last appointment she wanted to get an emotional support animal.  Referral placed to psychiatry.  Patient never turned in her new patient paperwork so the referral was closed.  Referral placed for patient to get get in with psychiatry.  She says she has had to deal with anxiety and depression most of her life.  She says she has used therapy in the past and for many years.  She denies any suicidal thoughts.  She is not currently on medication and does not wish to start medication.      06/14/2022    1:20 PM 12/07/2021    8:36 AM 12/07/2021    8:23 AM 05/31/2021    1:13 PM 11/16/2019    2:26 PM  Depression screen PHQ 2/9  Decreased Interest 0 1 0 0 0  Down, Depressed, Hopeless 0 1 0 0 0  PHQ - 2 Score 0 2 0 0 0  Altered sleeping 3 2  1  0  Tired, decreased energy 1 2  0 0  Change in appetite 0 2  0 0  Feeling bad or failure about yourself  0 0  0 0  Trouble concentrating 0 1  0 0  Moving slowly or fidgety/restless 0 1  0 0  Suicidal thoughts 0 0  0 0  PHQ-9 Score 4 10  1  0  Difficult doing work/chores Not difficult at all Somewhat difficult  Not difficult at all Not difficult at all       06/14/2022    1:23 PM 12/07/2021    8:37 AM 05/31/2021    1:13 PM  GAD 7 : Generalized Anxiety Score  Nervous, Anxious, on Edge 1 1 0  Control/stop worrying 1 2 0  Worry too much - different things 1 2 0  Trouble relaxing 0 1 0  Restless 0 1 0  Easily annoyed or irritable 0 1 0  Afraid - awful might happen 0 1 0  Total GAD 7 Score 3 9 0  Anxiety Difficulty Somewhat difficult  Not difficult at all     Relevant past medical, surgical, family and social history reviewed and updated as indicated. Interim medical history since our last visit reviewed. Allergies and medications reviewed and updated.  Review of Systems  Constitutional:  Negative for fever or weight change.  Respiratory: Negative for cough and positive shortness of breath.   Cardiovascular: Negative for chest pain or palpitations.  Gastrointestinal: Negative for abdominal pain, no bowel changes.  Musculoskeletal: Negative for gait problem or joint swelling.  Skin: Negative for rash.  Neurological: Negative for dizziness or headache.  No other specific complaints in a complete review of systems (except as listed in HPI above).      Objective:    BP 128/72   Pulse 98   Temp 98.2 F (36.8 C) (Oral)   Resp 16   Ht 5\' 4"  (1.626 m)   Wt 253 lb 8 oz (115 kg)   SpO2 95%   BMI 43.51 kg/m   Wt Readings from Last 3 Encounters:  06/14/22 253 lb 8 oz (115 kg)  12/07/21 261 lb 11.2 oz (118.7 kg)  05/31/21 260 lb 1.6 oz (118 kg)    Physical Exam  Constitutional: Patient appears well-developed and well-nourished. Obese  No distress.  HEENT: head atraumatic, normocephalic, pupils equal and reactive to light, neck supple Cardiovascular: Normal rate, regular rhythm and normal heart sounds.  No murmur heard. No BLE edema. Pulmonary/Chest: Effort normal and breath sounds wheezing. No respiratory distress. Abdominal: Soft.  There is no tenderness. Psychiatric: Patient has a normal mood and affect. behavior is normal. Judgment and thought content normal.  Results for orders placed or performed in visit on 05/31/21  CBC with Differential/Platelet  Result Value Ref Range   WBC 7.4 3.8 - 10.8 Thousand/uL   RBC 5.29 (H) 3.80 - 5.10 Million/uL   Hemoglobin 12.8 11.7 - 15.5 g/dL   HCT 06/02/21 06/02/21 - 16.0 %   MCV 76.9 (L) 80.0 - 100.0 fL   MCH 24.2 (L) 27.0 - 33.0 pg   MCHC 31.4 (L) 32.0 - 36.0 g/dL   RDW 10.9 32.3 - 55.7 %   Platelets 294 140 - 400 Thousand/uL   MPV 10.4 7.5 - 12.5 fL   Neutro Abs 4,092 1,500 - 7,800 cells/uL   Lymphs Abs 2,701 850 - 3,900 cells/uL   Absolute Monocytes 451 200 - 950 cells/uL   Eosinophils Absolute 104 15 - 500 cells/uL    Basophils Absolute 52 0 - 200 cells/uL   Neutrophils Relative % 55.3 %  Total Lymphocyte 36.5 %   Monocytes Relative 6.1 %   Eosinophils Relative 1.4 %   Basophils Relative 0.7 %  COMPLETE METABOLIC PANEL WITH GFR  Result Value Ref Range   Glucose, Bld 77 65 - 99 mg/dL   BUN 12 7 - 25 mg/dL   Creat 1.61 0.96 - 0.45 mg/dL   GFR, Est Non African American 103 > OR = 60 mL/min/1.46m2   GFR, Est African American 119 > OR = 60 mL/min/1.28m2   BUN/Creatinine Ratio NOT APPLICABLE 6 - 22 (calc)   Sodium 136 135 - 146 mmol/L   Potassium 4.7 3.5 - 5.3 mmol/L   Chloride 105 98 - 110 mmol/L   CO2 22 20 - 32 mmol/L   Calcium 9.2 8.6 - 10.2 mg/dL   Total Protein 6.9 6.1 - 8.1 g/dL   Albumin 4.0 3.6 - 5.1 g/dL   Globulin 2.9 1.9 - 3.7 g/dL (calc)   AG Ratio 1.4 1.0 - 2.5 (calc)   Total Bilirubin 0.6 0.2 - 1.2 mg/dL   Alkaline phosphatase (APISO) 40 31 - 125 U/L   AST 13 10 - 30 U/L   ALT 12 6 - 29 U/L  Hemoglobin A1c  Result Value Ref Range   Hgb A1c MFr Bld 5.6 <5.7 % of total Hgb   Mean Plasma Glucose 114 mg/dL   eAG (mmol/L) 6.3 mmol/L  Lipid panel  Result Value Ref Range   Cholesterol 229 (H) <200 mg/dL   HDL 47 (L) > OR = 50 mg/dL   Triglycerides 99 <409 mg/dL   LDL Cholesterol (Calc) 161 (H) mg/dL (calc)   Total CHOL/HDL Ratio 4.9 <5.0 (calc)   Non-HDL Cholesterol (Calc) 182 (H) <130 mg/dL (calc)  TSH  Result Value Ref Range   TSH 0.60 mIU/L  VITAMIN D 25 Hydroxy (Vit-D Deficiency, Fractures)  Result Value Ref Range   Vit D, 25-Hydroxy 22 (L) 30 - 100 ng/mL      Assessment & Plan:   Problem List Items Addressed This Visit       Respiratory   Sleep apnea    New order placed for CPAP machine.      Relevant Orders   For home use only DME continuous positive airway pressure (CPAP)     Nervous and Auditory   Chronic low back pain with left-sided sciatica    She reports her back pain has not been bad but she still having left-sided sciatica.  We will do small course of  steroids.  May do heat therapy to help as well.      Relevant Medications   predniSONE (STERAPRED UNI-PAK 21 TAB) 10 MG (21) TBPK tablet     Musculoskeletal and Integument   Dermatitis    Patient reports she gets heat rashes every summer.  She uses mometasone cream.  Refill sent.      Relevant Medications   mometasone (ELOCON) 0.1 % cream     Other   Class 3 severe obesity due to excess calories with serious comorbidity and body mass index (BMI) of 40.0 to 44.9 in adult [facility])    Discussed increasing physical activity as tolerated.      Hyperlipidemia    Not currently on any cholesterol-lowering medication.  We will recheck labs.      Relevant Orders   Lipid panel   Moderate episode of recurrent major depressive disorder (HCC) - Primary    Not currently on any medication.  Patient would like to see a psychiatrist.  Referral placed.  Relevant Orders   Ambulatory referral to Psychiatry   Anxiety    Not currently on any medication.  Patient would like to see a psychiatrist.  Referral placed.      Relevant Orders   Ambulatory referral to Psychiatry   Other Visit Diagnoses     Daytime sleepiness       New order placed for CPAP machine.   Relevant Orders   For home use only DME continuous positive airway pressure (CPAP)   Insomnia due to medical condition       New order placed for CPAP machine.   Relevant Orders   For home use only DME continuous positive airway pressure (CPAP)   Vitamin D deficiency       Taking vitamin D patient does not take her vitamin D supplement every day.   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        Follow up plan: Return in about 3 months (around 09/14/2022) for follow up,pap.

## 2022-06-14 NOTE — Assessment & Plan Note (Signed)
Not currently on any cholesterol-lowering medication.  We will recheck labs.

## 2022-06-17 ENCOUNTER — Telehealth: Payer: Self-pay

## 2022-06-17 ENCOUNTER — Other Ambulatory Visit: Payer: Self-pay | Admitting: Nurse Practitioner

## 2022-06-17 ENCOUNTER — Telehealth (INDEPENDENT_AMBULATORY_CARE_PROVIDER_SITE_OTHER): Payer: Managed Care, Other (non HMO) | Admitting: Nurse Practitioner

## 2022-06-17 ENCOUNTER — Encounter: Payer: Self-pay | Admitting: Nurse Practitioner

## 2022-06-17 ENCOUNTER — Other Ambulatory Visit: Payer: Self-pay

## 2022-06-17 DIAGNOSIS — R42 Dizziness and giddiness: Secondary | ICD-10-CM

## 2022-06-17 DIAGNOSIS — D649 Anemia, unspecified: Secondary | ICD-10-CM

## 2022-06-17 MED ORDER — MECLIZINE HCL 25 MG PO TABS: 25.0000 mg | ORAL_TABLET | Freq: Three times a day (TID) | ORAL | 0 refills | Status: DC | PRN

## 2022-06-17 NOTE — Progress Notes (Signed)
Name: Virginia Camacho   MRN: [id number]    DOB: 23-Jul-1981   Date:06/17/2022       Progress Note  Subjective  Chief Complaint  Chief Complaint  Patient presents with   Dizziness    I connected with  Mariella Saa  on 06/17/22 at 3:00 pm by a video enabled telemedicine application and verified that I am speaking with the correct person using two identifiers.  I discussed the limitations of evaluation and management by telemedicine and the availability of in person appointments. The patient expressed understanding and agreed to proceed with a virtual visit  Staff also discussed with the patient that there may be a patient responsible charge related to this service. Patient Location: home Provider Location: [facility] Additional Individuals present: alone  HPI  Vertigo: Patient reports that yesterday she started having some vertigo.  Patient has had a history of vertigo in the past.  She had a prescription of meclizine she took it and only had a mild relief.  States with the vertigo she has had some nausea.  She denies any headache or chest pain.  Patient states she was able to eat but she did have to come home from work early.  She was recently started on prednisone.  This could be a side effect of the prednisone.  Discussed that with patient.  Instructed patient to stop prednisone, will send in another prescription of meclizine.  Also discussed referral to ear nose and throat.  And uploaded Epley maneuver onto my chart for patient to view and try.  Also discussed with patient reasons to seek emergency care.  Patient verbalized understanding and agreeable to plan.  Patient Active Problem List   Diagnosis Date Noted   Hyperlipidemia 06/14/2022   Moderate episode of recurrent major depressive disorder (HCC) 06/14/2022   Anxiety 06/14/2022   Sleep apnea 05/31/2021   Class 3 severe obesity due to excess calories with serious comorbidity and body mass index (BMI) of 40.0 to 44.9 in adult [facility])  01/25/2019   Breast asymmetry on examination 05/08/2017   Dermatitis 05/07/2017   Acute pain of left shoulder 03/20/2016   Well woman exam with routine gynecological exam 03/07/2016   Breast mass, right 03/07/2016   Pain of right knee after injury 12/20/2015   Right hand pain 12/20/2015   Degeneration of intervertebral disc of lumbar region 12/20/2015   Chronic pain of right knee 12/20/2015   Headache, tension type, episodic 10/06/2015   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   Headache, migraine 10/06/2015    Social History   Tobacco Use   Smoking status: Every Day    Packs/day: 0.25    Years: 20.00    Total pack years: 5.00    Types: Cigarettes   Smokeless tobacco: Never  Substance Use Topics   Alcohol use: Yes    Alcohol/week: 0.0 standard drinks of alcohol    Comment: rarely     Current Outpatient Medications:    acetaminophen (TYLENOL) 650 MG CR tablet, Take 650 mg by mouth every 8 (eight) hours as needed., Disp: , Rfl:    albuterol (VENTOLIN HFA) 108 (90 Base) MCG/ACT inhaler, Inhale 2 puffs into the lungs every 4 (four) hours as needed for wheezing or shortness of breath., Disp: 1 each, Rfl: 4   meclizine (ANTIVERT) 25 MG tablet, Take 1 tablet (25 mg total) by mouth 3 (three) times daily as needed for dizziness., Disp: 30 tablet, Rfl: 0   mometasone (ELOCON) 0.1 %  cream, Apply to affected area once daily or as needed., Disp: 45 g, Rfl: 2   predniSONE (STERAPRED UNI-PAK 21 TAB) 10 MG (21) TBPK tablet, Take as directed on package.  (60 mg po on day 1, 50 mg po on day 2...), Disp: 21 tablet, Rfl: 0  Allergies  Allergen Reactions   Clarithromycin Other (See Comments) and Nausea Only    Hot   Oxycodone Hives    I personally reviewed active problem list, medication list, allergies, notes from last encounter with the patient/caregiver today.  ROS  Constitutional: Negative for fever or weight change.  Respiratory: Negative for cough and shortness of  breath.   Cardiovascular: Negative for chest pain or palpitations.  Gastrointestinal: Negative for abdominal pain, no bowel changes.  Musculoskeletal: Negative for gait problem or joint swelling.  Skin: Negative for rash.  Neurological: positive for dizziness, negative for headache.  No other specific complaints in a complete review of systems (except as listed in HPI above).   Objective  Virtual encounter, vitals not obtained.  There is no height or weight on file to calculate BMI.  Nursing Note and Vital Signs reviewed.  Physical Exam  Awake, alert and oriented speaking in complete sentences  No results found for this or any previous visit (from the past 72 hour(s)).  Assessment & Plan  1. Vertigo Comments: Stop taking prednisone.  Prescription for meclizine sent in.  Try Epley maneuver.  Referral for  ENT.  Discussed reasons seek emergency care. - meclizine (ANTIVERT) 25 MG tablet; Take 1 tablet (25 mg total) by mouth 3 (three) times daily as needed for dizziness.  Dispense: 30 tablet; Refill: 0 - Ambulatory referral to ENT    -Red flags and when to present for emergency care or RTC including fever >101.34F, chest pain, shortness of breath, new/worsening/un-resolving symptoms,  reviewed with patient at time of visit. Follow up and care instructions discussed and provided in AVS. - I discussed the assessment and treatment plan with the patient. The patient was provided an opportunity to ask questions and all were answered. The patient agreed with the plan and demonstrated an understanding of the instructions.  I provided 15 minutes of non-face-to-face time during this encounter.  Berniece Salines, FNP

## 2022-06-17 NOTE — Telephone Encounter (Signed)
Copied from CRM [id number]. Topic: General - Other >> Jun 17, 2022  1:09 PM Lynford Citizen wrote: Reason for CRM: Pt called in asking to speak directly with Myriam Jacobson, please advise.

## 2022-06-18 LAB — COMPLETE METABOLIC PANEL WITH GFR
AG Ratio: 1.3 (calc) (ref 1.0–2.5)
ALT: 13 U/L (ref 6–29)
AST: 14 U/L (ref 10–30)
Albumin: 4.2 g/dL (ref 3.6–5.1)
Alkaline phosphatase (APISO): 48 U/L (ref 31–125)
BUN: 13 mg/dL (ref 7–25)
CO2: 25 mmol/L (ref 20–32)
Calcium: 10 mg/dL (ref 8.6–10.2)
Chloride: 103 mmol/L (ref 98–110)
Creat: 0.75 mg/dL (ref 0.50–0.99)
Globulin: 3.2 g/dL (calc) (ref 1.9–3.7)
Glucose, Bld: 92 mg/dL (ref 65–99)
Potassium: 4.1 mmol/L (ref 3.5–5.3)
Sodium: 137 mmol/L (ref 135–146)
Total Bilirubin: 0.6 mg/dL (ref 0.2–1.2)
Total Protein: 7.4 g/dL (ref 6.1–8.1)
eGFR: 103 mL/min/{1.73_m2} (ref >=60)

## 2022-06-18 LAB — LIPID PANEL
Cholesterol: 228 mg/dL — ABNORMAL HIGH (ref <200)
HDL: 52 mg/dL (ref >=50)
LDL Cholesterol (Calc): 149 mg/dL (calc) — ABNORMAL HIGH
Non-HDL Cholesterol (Calc): 176 mg/dL (calc) — ABNORMAL HIGH (ref <130)
Total CHOL/HDL Ratio: 4.4 (calc) (ref <5.0)
Triglycerides: 144 mg/dL (ref <150)

## 2022-06-18 LAB — TEST AUTHORIZATION

## 2022-06-18 LAB — IRON,TIBC AND FERRITIN PANEL
%SAT: 8 % (calc) — ABNORMAL LOW (ref 16–45)
Ferritin: 9 ng/mL — ABNORMAL LOW (ref 16–232)
Iron: 43 ug/dL (ref 40–190)
TIBC: 531 mcg/dL (calc) — ABNORMAL HIGH (ref 250–450)

## 2022-06-18 LAB — CBC WITH DIFFERENTIAL/PLATELET
Absolute Monocytes: 469 cells/uL (ref 200–950)
Basophils Absolute: 59 cells/uL (ref 0–200)
Basophils Relative: 0.9 %
Eosinophils Absolute: 99 cells/uL (ref 15–500)
Eosinophils Relative: 1.5 %
HCT: 37.9 % (ref 35.0–45.0)
Hemoglobin: 11.4 g/dL — ABNORMAL LOW (ref 11.7–15.5)
Lymphs Abs: 1967 cells/uL (ref 850–3900)
MCH: 20.6 pg — ABNORMAL LOW (ref 27.0–33.0)
MCHC: 30.1 g/dL — ABNORMAL LOW (ref 32.0–36.0)
MCV: 68.4 fL — ABNORMAL LOW (ref 80.0–100.0)
MPV: 10.3 fL (ref 7.5–12.5)
Monocytes Relative: 7.1 %
Neutro Abs: 4006 cells/uL (ref 1500–7800)
Neutrophils Relative %: 60.7 %
Platelets: 414 10*3/uL — ABNORMAL HIGH (ref 140–400)
RBC: 5.54 10*6/uL — ABNORMAL HIGH (ref 3.80–5.10)
RDW: 16.1 % — ABNORMAL HIGH (ref 11.0–15.0)
Total Lymphocyte: 29.8 %
WBC: 6.6 10*3/uL (ref 3.8–10.8)

## 2022-06-18 LAB — HEMOGLOBIN A1C
Hgb A1c MFr Bld: 5.6 % of total Hgb (ref <5.7)
Mean Plasma Glucose: 114 mg/dL
eAG (mmol/L): 6.3 mmol/L

## 2022-06-18 LAB — CBC MORPHOLOGY

## 2022-06-18 LAB — VITAMIN D 25 HYDROXY (VIT D DEFICIENCY, FRACTURES): Vit D, 25-Hydroxy: 23 ng/mL — ABNORMAL LOW (ref 30–100)

## 2022-06-18 NOTE — Telephone Encounter (Signed)
Spoke to patient and she did a video visit

## 2022-09-24 ENCOUNTER — Ambulatory Visit: Payer: Managed Care, Other (non HMO) | Admitting: Nurse Practitioner

## 2022-10-21 ENCOUNTER — Encounter: Payer: Self-pay | Admitting: Nurse Practitioner

## 2022-10-21 ENCOUNTER — Other Ambulatory Visit: Payer: Self-pay

## 2022-10-21 ENCOUNTER — Ambulatory Visit (INDEPENDENT_AMBULATORY_CARE_PROVIDER_SITE_OTHER): Payer: Managed Care, Other (non HMO) | Admitting: Nurse Practitioner

## 2022-10-21 ENCOUNTER — Other Ambulatory Visit (HOSPITAL_COMMUNITY)
Admission: RE | Admit: 2022-10-21 | Discharge: 2022-10-21 | Disposition: A | Discharge status: Home / Self Care | Payer: Managed Care, Other (non HMO) | Source: Ambulatory Visit | Attending: Nurse Practitioner | Admitting: Nurse Practitioner

## 2022-10-21 VITALS — BP 128/82 | HR 100 | Temp 98.4°F | Resp 16 | Ht 64.0 in | Wt 257.1 lb

## 2022-10-21 DIAGNOSIS — E559 Vitamin D deficiency, unspecified: Secondary | ICD-10-CM

## 2022-10-21 DIAGNOSIS — Z1231 Encounter for screening mammogram for malignant neoplasm of breast: Secondary | ICD-10-CM

## 2022-10-21 DIAGNOSIS — F419 Anxiety disorder, unspecified: Secondary | ICD-10-CM

## 2022-10-21 DIAGNOSIS — E785 Hyperlipidemia, unspecified: Secondary | ICD-10-CM

## 2022-10-21 DIAGNOSIS — Z124 Encounter for screening for malignant neoplasm of cervix: Secondary | ICD-10-CM

## 2022-10-21 DIAGNOSIS — Z6841 Body Mass Index (BMI) 40.0 and over, adult: Secondary | ICD-10-CM

## 2022-10-21 DIAGNOSIS — R0602 Shortness of breath: Secondary | ICD-10-CM | POA: Diagnosis not present

## 2022-10-21 DIAGNOSIS — D509 Iron deficiency anemia, unspecified: Secondary | ICD-10-CM | POA: Insufficient documentation

## 2022-10-21 DIAGNOSIS — F330 Major depressive disorder, recurrent, mild: Secondary | ICD-10-CM

## 2022-10-21 DIAGNOSIS — U099 Post covid-19 condition, unspecified: Secondary | ICD-10-CM | POA: Insufficient documentation

## 2022-10-21 DIAGNOSIS — G4733 Obstructive sleep apnea (adult) (pediatric): Secondary | ICD-10-CM

## 2022-10-21 MED ORDER — IRON (FERROUS SULFATE) 325 (65 FE) MG PO TABS: 325.0000 mg | ORAL_TABLET | Freq: Every day | ORAL | 1 refills | Status: DC

## 2022-10-21 MED ORDER — ALBUTEROL SULFATE HFA 108 (90 BASE) MCG/ACT IN AERS
2.0000 | INHALATION_SPRAY | RESPIRATORY_TRACT | 4 refills | Status: DC | PRN
Start: 2022-10-21 — End: 2023-09-23

## 2022-10-21 NOTE — Progress Notes (Addendum)
BP 128/82   Pulse 100   Temp 98.4 F (36.9 C) (Oral)   Resp 16   Ht _0  (1.626 m)   Wt 257 lb 1.6 oz (116.6 kg)   LMP 10/12/2022 (Exact Date)   SpO2 94%   BMI 44.13 kg/m    Subjective:    Patient ID: Virginia Camacho, female    DOB: [birth year], 41 y.o.   MRN: [id number]  HPI: Virginia Camacho is a 41 y.o. female  Chief Complaint  Patient presents with   Breathing Problem   Gynecologic Exam   Shortness of breath: Last appointment patient discussed that she had been having shortness of breath since having Covid.  She says she had covid in 11/2019. She says that she has been having noticeable increase in shortness of breath since then. She says that her girl friend has noticed that she is wheezing. She says she stopped smoking about a year ago.  She denies any fever.  Walking pulse ox dropped to 91 % with a heart rate of 135 on 06/14/2022.  Discussed she has symptoms of long covid. Started her with sample of budesonide inhaler and prescription for albuterol inhaler. Ordered a  chest xray due to history of smoking, which was negative.  Discussed with her that if no improvement would refer to pulmonology. Today patient reports she says that she is still getting short of breath. She says she also has a dry cough for about a month. She says it is random.  She says it comes and goes .  Discussed with patient that she needs to see pulmonology.  Will place referral.   Sleep apnea: Last appointment patient reported that she had a Sleep Study was done on 02/26/2019. She had not gotten any equipment.  She says she would  wake up multiple times a night gasping for air. We placed a new order for cpap machine and supplies. Patient is here today to report that she has obtained her cpap machine.  She says she has had it since 08/26/2022.  She currently has Lg3 type, mask tiype is Airfit f20, pressure setting 5-10. She says that in the past two months she has missed maybe three nights. She says that she is  feeling good when she was using it.  She says she is waking up headache free and feels well rested.  She says she is getting about 6 hours of sleep.   Vitamin d deficiency: Her vitamin D level was 23 on 06/14/2022. She says that she has not been taking it every day. Recommend getting the vitamin D 50,000 units and taking it once a week.   Hyperlipidemia:Her last LDL was 149 on 06/14/2022.Continue working on lifestyle modification.  The 10-year ASCVD risk score (Arnett DK, et al., 2019) is: 1.9%   Obesity: Her weight today is 257 lbs with a BMI of 44.13. Recommend eating well balanced diet with portion control and increasing physical activity as tolerated.   Iron deficiency anemia: Her last h/h was 11.4/37.9. iron 43, TIBC 531, Ferritin 9. She denies any abnormal bleeding in stool or vaginal bleeding.  Will start patient on iron supplement.   Anxiety/depression: Her mood has improved, she is not currently on medication.        10/21/2022   10:46 AM 10/21/2022    9:59 AM 06/17/2022    3:04 PM 06/14/2022    1:20 PM 12/07/2021    8:36 AM  Depression screen PHQ 2/9  Decreased Interest 0 0  0 0 1  Down, Depressed, Hopeless 0 0 0 0 1  PHQ - 2 Score 0 0 0 0 2  Altered sleeping 0   3 2  Tired, decreased energy 0   1 2  Change in appetite 0   0 2  Feeling bad or failure about yourself  0   0 0  Trouble concentrating 0   0 1  Moving slowly or fidgety/restless 0   0 1  Suicidal thoughts    0 0  PHQ-9 Score 0   4 10  Difficult doing work/chores    Not difficult at all Somewhat difficult       06/14/2022    1:23 PM 12/07/2021    8:37 AM 05/31/2021    1:13 PM  GAD 7 : Generalized Anxiety Score  Nervous, Anxious, on Edge 1 1 0  Control/stop worrying 1 2 0  Worry too much - different things 1 2 0  Trouble relaxing 0 1 0  Restless 0 1 0  Easily annoyed or irritable 0 1 0  Afraid - awful might happen 0 1 0  Total GAD 7 Score 3 9 0  Anxiety Difficulty Somewhat difficult  Not difficult at all      Relevant past medical, surgical, family and social history reviewed and updated as indicated. Interim medical history since our last visit reviewed. Allergies and medications reviewed and updated.  Review of Systems  Constitutional: Negative for fever or weight change.  Respiratory: Negative for cough and positive shortness of breath.   Cardiovascular: Negative for chest pain or palpitations.  Gastrointestinal: Negative for abdominal pain, no bowel changes.  Musculoskeletal: Negative for gait problem or joint swelling.  Skin: Negative for rash.  Neurological: Negative for dizziness or headache.  No other specific complaints in a complete review of systems (except as listed in HPI above).      Objective:    BP 128/82   Pulse 100   Temp 98.4 F (36.9 C) (Oral)   Resp 16   Ht _0  (1.626 m)   Wt 257 lb 1.6 oz (116.6 kg)   LMP 10/12/2022 (Exact Date)   SpO2 94%   BMI 44.13 kg/m   Wt Readings from Last 3 Encounters:  10/21/22 257 lb 1.6 oz (116.6 kg)  06/14/22 253 lb 8 oz (115 kg)  12/07/21 261 lb 11.2 oz (118.7 kg)    Physical Exam  Constitutional: Patient appears well-developed and well-nourished. Obese  No distress.  HEENT: head atraumatic, normocephalic, pupils equal and reactive to light, neck supple Cardiovascular: Normal rate, regular rhythm and normal heart sounds.  No murmur heard. No BLE edema. Pulmonary/Chest: Effort normal and breath sounds wheezing. No respiratory distress. Abdominal: Soft.  There is no tenderness. Psychiatric: Patient has a normal mood and affect. behavior is normal. Judgment and thought content normal. Pelvic exam: normal external genitalia, vulva, vagina, cervix, uterus and adnexa.    Results for orders placed or performed in visit on 06/14/22  Lipid panel  Result Value Ref Range   Cholesterol 228 (H) <200 mg/dL   HDL 52 > OR = 50 mg/dL   Triglycerides 144 <150 mg/dL   LDL Cholesterol (Calc) 149 (H) mg/dL (calc)   Total CHOL/HDL Ratio  4.4 <5.0 (calc)   Non-HDL Cholesterol (Calc) 176 (H) <130 mg/dL (calc)  CBC with Differential/Platelet  Result Value Ref Range   WBC 6.6 3.8 - 10.8 Thousand/uL   RBC 5.54 (H) 3.80 - 5.10 Million/uL   Hemoglobin 11.4 (L)  11.7 - 15.5 g/dL   HCT 37.9 35.0 - 45.0 %   MCV 68.4 (L) 80.0 - 100.0 fL   MCH 20.6 (L) 27.0 - 33.0 pg   MCHC 30.1 (L) 32.0 - 36.0 g/dL   RDW 16.1 (H) 11.0 - 15.0 %   Platelets 414 (H) 140 - 400 Thousand/uL   MPV 10.3 7.5 - 12.5 fL   Neutro Abs 4,006 1,500 - 7,800 cells/uL   Lymphs Abs 1,967 850 - 3,900 cells/uL   Absolute Monocytes 469 200 - 950 cells/uL   Eosinophils Absolute 99 15 - 500 cells/uL   Basophils Absolute 59 0 - 200 cells/uL   Neutrophils Relative % 60.7 %   Total Lymphocyte 29.8 %   Monocytes Relative 7.1 %   Eosinophils Relative 1.5 %   Basophils Relative 0.9 %  COMPLETE METABOLIC PANEL WITH GFR  Result Value Ref Range   Glucose, Bld 92 65 - 99 mg/dL   BUN 13 7 - 25 mg/dL   Creat 0.75 0.50 - 0.99 mg/dL   eGFR 103 > OR = 60 mL/min/1.22m   BUN/Creatinine Ratio NOT APPLICABLE 6 - 22 (calc)   Sodium 137 135 - 146 mmol/L   Potassium 4.1 3.5 - 5.3 mmol/L   Chloride 103 98 - 110 mmol/L   CO2 25 20 - 32 mmol/L   Calcium 10.0 8.6 - 10.2 mg/dL   Total Protein 7.4 6.1 - 8.1 g/dL   Albumin 4.2 3.6 - 5.1 g/dL   Globulin 3.2 1.9 - 3.7 g/dL (calc)   AG Ratio 1.3 1.0 - 2.5 (calc)   Total Bilirubin 0.6 0.2 - 1.2 mg/dL   Alkaline phosphatase (APISO) 48 31 - 125 U/L   AST 14 10 - 30 U/L   ALT 13 6 - 29 U/L  VITAMIN D 25 Hydroxy (Vit-D Deficiency, Fractures)  Result Value Ref Range   Vit D, 25-Hydroxy 23 (L) 30 - 100 ng/mL  Hemoglobin A1c  Result Value Ref Range   Hgb A1c MFr Bld 5.6 <5.7 % of total Hgb   Mean Plasma Glucose 114 mg/dL   eAG (mmol/L) 6.3 mmol/L  CBC MORPHOLOGY  Result Value Ref Range   CBC MORPHOLOGY  NORMAL  Iron, TIBC and Ferritin Panel  Result Value Ref Range   Iron 43 40 - 190 mcg/dL   TIBC 531 (H) 250 - 450 mcg/dL (calc)    %SAT 8 (L) 16 - 45 % (calc)   Ferritin 9 (L) 16 - 232 ng/mL  TEST AUTHORIZATION  Result Value Ref Range   TEST NAME: IRON, TIBC AND FERRITIN PANEL    TEST CODE: [id number]    CLIENT CONTACT: LESLIE SMITH    REPORT ALWAYS MESSAGE SIGNATURE        Assessment & Plan:   Problem List Items Addressed This Visit       Respiratory   Sleep apnea    Patient is currently using CPAP machine every night, she says in the last two months she maybe missed three nights.  She says she is doing much better and the CPAP machine is working well        Other   Class 3 severe obesity due to excess calories with serious comorbidity and body mass index (BMI) of 40.0 to 44.9 in adult ([facility]    Continue working on lifestyle modification      Hyperlipidemia    Recommend continuing lifestyle modification      Moderate episode of recurrent major depressive disorder [place]  Patient reports she is doing well.       Anxiety    Patient reports her mood is doing well      COVID-19 long hauler    COntinued shortness of breath s/p covid infection, recommend follow up with pulmonology.  Referral placed.       Relevant Medications   albuterol (VENTOLIN HFA) 108 (90 Base) MCG/ACT inhaler   Other Relevant Orders   Ambulatory referral to Pulmonology   Iron deficiency anemia    Discussed taking iron supplement, patient agrees with plan of care.       Relevant Medications   Iron, Ferrous Sulfate, 325 (65 Fe) MG TABS   Other Visit Diagnoses     Shortness of breath    -  Primary   referral placed to pulmonology, continue using albuterol inhaler as needed   Relevant Medications   albuterol (VENTOLIN HFA) 108 (90 Base) MCG/ACT inhaler   Other Relevant Orders   Ambulatory referral to Pulmonology   Vitamin D deficiency       Cervical cancer screening       Relevant Orders   Cytology - PAP   Encounter for screening mammogram for malignant neoplasm of breast       Relevant Orders   MM 3D SCREEN BREAST  BILATERAL        Follow up plan: Return in about 6 months (around 04/21/2023) for follow up.

## 2022-10-21 NOTE — Assessment & Plan Note (Signed)
COntinued shortness of breath s/p covid infection, recommend follow up with pulmonology.  Referral placed.

## 2022-10-21 NOTE — Assessment & Plan Note (Signed)
Patient reports she is doing well 

## 2022-10-21 NOTE — Assessment & Plan Note (Signed)
Continue working on lifestyle modification 

## 2022-10-21 NOTE — Assessment & Plan Note (Signed)
Discussed taking iron supplement, patient agrees with plan of care.

## 2022-10-21 NOTE — Assessment & Plan Note (Signed)
Recommend continuing lifestyle modification

## 2022-10-21 NOTE — Assessment & Plan Note (Signed)
Patient reports her mood is doing well

## 2022-10-21 NOTE — Assessment & Plan Note (Signed)
Patient is currently using CPAP machine every night, she says in the last two months she maybe missed three nights.  She says she is doing much better and the CPAP machine is working well

## 2022-10-22 LAB — CYTOLOGY - PAP
Adequacy: ABSENT
Chlamydia: NEGATIVE
Comment: NEGATIVE
Comment: NEGATIVE
Comment: NORMAL
Diagnosis: NEGATIVE
High risk HPV: NEGATIVE
Neisseria Gonorrhea: NEGATIVE

## 2022-11-13 ENCOUNTER — Institutional Professional Consult (permissible substitution): Payer: Managed Care, Other (non HMO) | Admitting: Student in an Organized Health Care Education/Training Program

## 2022-12-23 ENCOUNTER — Encounter: Payer: Self-pay | Admitting: Student in an Organized Health Care Education/Training Program

## 2022-12-23 ENCOUNTER — Ambulatory Visit (INDEPENDENT_AMBULATORY_CARE_PROVIDER_SITE_OTHER): Payer: Managed Care, Other (non HMO) | Admitting: Student in an Organized Health Care Education/Training Program

## 2022-12-23 VITALS — BP 134/84 | HR 92 | Temp 97.5°F | Ht 64.0 in | Wt 254.0 lb

## 2022-12-23 DIAGNOSIS — R0602 Shortness of breath: Secondary | ICD-10-CM | POA: Diagnosis not present

## 2022-12-23 NOTE — Progress Notes (Signed)
Synopsis: Referred in for shortness of breath by Berniece Salines, FNP  Assessment & Plan:   1. Shortness of breath  The patient is presenting for the evaluation of shortness of breath with a differential that includes reactive airway disease such as asthma (given her family history), long-haul COVID, and decreased chest wall compliance secondary to obesity.  I will initiate a workup with a pulmonary function test to include spirometry, lung volumes, and DLCO.  I will also obtain a CBC with differential and an allergen panel with IgE to evaluate for the T-helper cell type II response.  I will see the patient for follow-up shortly after her pulmonary function testing to assess and evaluate further steps.  I did counsel the patient about the different etiologies that could be contributing to her shortness of breath and did recommend that she continue to lose weight as she has been doing.  - Allergen Panel (27) + IGE - CBC with Differential/Platelet - Pulmonary Function Test [facility] Only; Future   Return in about 2 months (around 02/21/2023).  I spent 60 minutes caring for this patient today, including preparing to see the patient, obtaining a medical history , reviewing a separately obtained history, performing a medically appropriate examination and/or evaluation, counseling and educating the patient/family/caregiver, ordering medications, tests, or procedures, and documenting clinical information in the electronic health record  Virginia Chute, MD [facility] 12/23/2022 4:39 PM    End of visit medications:  No orders of the defined types were placed in this encounter.    Current Outpatient Medications:    acetaminophen (TYLENOL) 650 MG CR tablet, Take 650 mg by mouth every 8 (eight) hours as needed., Disp: , Rfl:    albuterol (VENTOLIN HFA) 108 (90 Base) MCG/ACT inhaler, Inhale 2 puffs into the lungs every 4 (four) hours as needed for wheezing or shortness of breath.,  Disp: 1 each, Rfl: 4   Iron, Ferrous Sulfate, 325 (65 Fe) MG TABS, Take 325 mg by mouth daily., Disp: 90 tablet, Rfl: 1   meclizine (ANTIVERT) 25 MG tablet, Take 1 tablet (25 mg total) by mouth 3 (three) times daily as needed for dizziness., Disp: 30 tablet, Rfl: 0   mometasone (ELOCON) 0.1 % cream, Apply to affected area once daily or as needed., Disp: 45 g, Rfl: 2   Subjective:   PATIENT ID: Virginia Camacho GENDER: female DOB: Mar 11, 1981, MRN: [id number]  Chief Complaint  Patient presents with   Consult    SOB with simple task. No wheezing or cough.  Better in the last month. Asthma runs in her family.     HPI  Virginia Camacho is a pleasant 42 year old female presenting to clinic for the evaluation of shortness of breath.  She reports having had COVID twice in the last 3 years, last of which was around a year and a half ago.  Following her last bout with COVID, she did not feel that she fully recovered.  Patient reports mostly exertional dyspnea and feels winded with mild to moderate activity.  Walking short distances gets her quite winded as does going up any flights of stairs.  She denies any chest pain, chest tightness, wheezing, cough, sputum production or hemoptysis.  She reports no personal history of lung disease.  She grew up in [place] and was exposed to extreme cold as a young adult which did not cause her to be short of breath.  She was never treated for any bronchitis or similar presentations.  She was  very active in her youth and played a lot of basketball without limitation.  She tells me that she gained a significant amount of weight which she has struggled to lose.  Over the past year or so, she did gain weight, and was up to 260 pounds.  Over the course of this, her shortness of breath was more noticeable.  She was seen by her primary care physician and was prescribed albuterol that she was using with activity without much improvement.  Over the past month, she has lost 10 pounds and  has felt some improvement in her breathing to the point where she has not had to use her albuterol.  The patient has a history of OSA for which she uses a CPAP and is generally compliant with it.  She has a history of smoking, quit 1 year ago (between 5 and 10 pack years).  She does not have any other inhalational exposures.  She does not have any pets but used to have a dog that recently passed away.  She does notice some environmental allergies but has not had to be treated for them.  She has an apartment but does not have any mold on her house is mostly hardwood.  She works as a Financial controller and reports feeling bothered only when she is exposed to a lot of barbecue smoke at certain restaurants.  Family history is notable for severe asthma in her mother and sister.  Ancillary information including prior medications, full medical/surgical/family/social histories, and PFTs (when available) are listed below and have been reviewed.   Review of Systems  Constitutional:  Negative for chills, fever and malaise/fatigue.  Respiratory:  Positive for shortness of breath. Negative for cough, hemoptysis, sputum production and wheezing.   Cardiovascular:  Negative for chest pain, leg swelling and PND.  Neurological:  Negative for dizziness.     Objective:   Vitals:   12/23/22 1614  BP: 134/84  Pulse: 92  Temp: (!) 97.5 F (36.4 C)  SpO2: 100%  Weight: 254 lb (115.2 kg)  Height: 5\' 4"  (1.626 m)   100% on RA  BMI Readings from Last 3 Encounters:  12/23/22 43.60 kg/m  10/21/22 44.13 kg/m  06/14/22 43.51 kg/m   Wt Readings from Last 3 Encounters:  12/23/22 254 lb (115.2 kg)  10/21/22 257 lb 1.6 oz (116.6 kg)  06/14/22 253 lb 8 oz (115 kg)    Physical Exam Constitutional:      General: She is not in acute distress.    Appearance: Normal appearance. She is obese. She is not ill-appearing.  HENT:     Head: Normocephalic.     Mouth/Throat:     Mouth: Mucous membranes are moist.   Eyes:     Extraocular Movements: Extraocular movements intact.     Pupils: Pupils are equal, round, and reactive to light.  Cardiovascular:     Rate and Rhythm: Normal rate.     Pulses: Normal pulses.     Heart sounds: Normal heart sounds.  Pulmonary:     Effort: Pulmonary effort is normal. No respiratory distress.     Breath sounds: Normal breath sounds. No wheezing, rhonchi or rales.  Abdominal:     Palpations: Abdomen is soft.  Musculoskeletal:     Cervical back: Normal range of motion.  Skin:    General: Skin is warm.  Neurological:     General: No focal deficit present.     Mental Status: She is alert and oriented to person, place,  and time. Mental status is at baseline.       Ancillary Information    Past Medical History:  Diagnosis Date   Allergy    Back pain    Morbid obesity with BMI of 40.0-44.9, adult ([place]) 03/20/2016     Family History  Problem Relation Age of Onset   Diabetes Mother    Breast cancer Maternal Grandmother      Past Surgical History:  Procedure Laterality Date   KNEE SURGERY Right     Social History   Socioeconomic History   Marital status: Single    Spouse name: Not on file   Number of children: Not on file   Years of education: Not on file   Highest education level: Not on file  Occupational History   Not on file  Tobacco Use   Smoking status: Former    Packs/day: 0.25    Years: 20.00    Total pack years: 5.00    Types: Cigarettes    Quit date: 2022    Years since quitting: 2.0   Smokeless tobacco: Never  Vaping Use   Vaping Use: Never used  Substance and Sexual Activity   Alcohol use: Yes    Alcohol/week: 0.0 standard drinks of alcohol    Comment: rarely   Drug use: Yes    Frequency: 1.0 times per week    Types: Marijuana    Comment: occasional    Sexual activity: Yes    Partners: Female, Female    Birth control/protection: Condom  Other Topics Concern   Not on file  Social History Narrative   Not on file    Social Determinants of Health   Financial Resource Strain: Low Risk  (05/31/2021)   Overall Financial Resource Strain (CARDIA)    Difficulty of Paying Living Expenses: Not hard at all  Food Insecurity: No Food Insecurity (05/31/2021)   Hunger Vital Sign    Worried About Running Out of Food in the Last Year: Never true    [place] in the Last Year: Never true  Transportation Needs: No Transportation Needs (05/31/2021)   PRAPARE - Hydrologist (Medical): No    Lack of Transportation (Non-Medical): No  Physical Activity: Inactive (05/31/2021)   Exercise Vital Sign    Days of Exercise per Week: 0 days    Minutes of Exercise per Session: 0 min  Stress: No Stress Concern Present (05/31/2021)   [place]    Feeling of Stress : Only a little  Social Connections: Socially Isolated (05/31/2021)   Social Connection and Isolation Panel [NHANES]    Frequency of Communication with Friends and Family: More than three times a week    Frequency of Social Gatherings with Friends and Family: Once a week    Attends Religious Services: Never    Marine scientist or Organizations: No    Attends Archivist Meetings: Never    Marital Status: Never married  Intimate Partner Violence: Not At Risk (05/31/2021)   Humiliation, Afraid, Rape, and Kick questionnaire    Fear of Current or Ex-Partner: No    Emotionally Abused: No    Physically Abused: No    Sexually Abused: No     Allergies  Allergen Reactions   Clarithromycin Other (See Comments) and Nausea Only    Hot   Oxycodone Hives     CBC    Component Value Date/Time   WBC 6.6  06/14/2022 1402   RBC 5.54 (H) 06/14/2022 1402   HGB 11.4 (L) 06/14/2022 1402   HGB 13.5 03/07/2016 1102   HCT 37.9 06/14/2022 1402   HCT 40.8 03/07/2016 1102   PLT 414 (H) 06/14/2022 1402   PLT 346 03/07/2016 1102   MCV 68.4 (L) 06/14/2022 1402   MCV 81  03/07/2016 1102   MCH 20.6 (L) 06/14/2022 1402   MCHC 30.1 (L) 06/14/2022 1402   RDW 16.1 (H) 06/14/2022 1402   RDW 15.1 03/07/2016 1102   LYMPHSABS 1,967 06/14/2022 1402   LYMPHSABS 2.6 03/07/2016 1102   MONOABS 420 05/07/2017 0819   EOSABS 99 06/14/2022 1402   EOSABS 0.1 03/07/2016 1102   BASOSABS 59 06/14/2022 1402   BASOSABS 0.0 03/07/2016 1102    Pulmonary Functions Testing Results:     No data to display          Outpatient Medications Prior to Visit  Medication Sig Dispense Refill   acetaminophen (TYLENOL) 650 MG CR tablet Take 650 mg by mouth every 8 (eight) hours as needed.     albuterol (VENTOLIN HFA) 108 (90 Base) MCG/ACT inhaler Inhale 2 puffs into the lungs every 4 (four) hours as needed for wheezing or shortness of breath. 1 each 4   Iron, Ferrous Sulfate, 325 (65 Fe) MG TABS Take 325 mg by mouth daily. 90 tablet 1   meclizine (ANTIVERT) 25 MG tablet Take 1 tablet (25 mg total) by mouth 3 (three) times daily as needed for dizziness. 30 tablet 0   mometasone (ELOCON) 0.1 % cream Apply to affected area once daily or as needed. 45 g 2   No facility-administered medications prior to visit.

## 2022-12-23 NOTE — Patient Instructions (Signed)
Today, I ordered blood work. You can get them draw at your preferred [facility] draw station. The nearest one to [facility] is at nearby [facility] ([address], [place], [place] [address]). 

## 2022-12-26 LAB — CBC WITH DIFFERENTIAL/PLATELET
Basophils Absolute: 0.1 10*3/uL (ref 0.0–0.2)
Basos: 1 %
EOS (ABSOLUTE): 0.1 10*3/uL (ref 0.0–0.4)
Eos: 2 %
Hematocrit: 45.4 % (ref 34.0–46.6)
Hemoglobin: 14 g/dL (ref 11.1–15.9)
Immature Grans (Abs): 0 10*3/uL (ref 0.0–0.1)
Immature Granulocytes: 0 %
Lymphocytes Absolute: 2.2 10*3/uL (ref 0.7–3.1)
Lymphs: 43 %
MCH: 22.3 pg — ABNORMAL LOW (ref 26.6–33.0)
MCHC: 30.8 g/dL — ABNORMAL LOW (ref 31.5–35.7)
MCV: 72 fL — ABNORMAL LOW (ref 79–97)
Monocytes Absolute: 0.3 10*3/uL (ref 0.1–0.9)
Monocytes: 6 %
Neutrophils Absolute: 2.5 10*3/uL (ref 1.4–7.0)
Neutrophils: 48 %
Platelets: 356 10*3/uL (ref 150–450)
RBC: 6.29 x10E6/uL — ABNORMAL HIGH (ref 3.77–5.28)
RDW: 21.4 % — ABNORMAL HIGH (ref 11.7–15.4)
WBC: 5.2 10*3/uL (ref 3.4–10.8)

## 2022-12-26 LAB — ALLERGEN PANEL (27) + IGE
Alternaria Alternata IgE: <0.1 kU/L
Aspergillus Fumigatus IgE: <0.1 kU/L
Bahia Grass IgE: <0.1 kU/L
Bermuda Grass IgE: <0.1 kU/L
Cat Dander IgE: <0.1 kU/L
Cedar, Mountain IgE: <0.1 kU/L
Cladosporium Herbarum IgE: <0.1 kU/L
Cocklebur IgE: <0.1 kU/L
Cockroach, American IgE: <0.1 kU/L
Common Silver Birch IgE: <0.1 kU/L
D Farinae IgE: <0.1 kU/L
D Pteronyssinus IgE: <0.1 kU/L
Dog Dander IgE: <0.1 kU/L
Elm, American IgE: <0.1 kU/L
Hickory, White IgE: <0.1 kU/L
IgE (Immunoglobulin E), Serum: 139 IU/mL (ref 6–495)
Johnson Grass IgE: <0.1 kU/L
Kentucky Bluegrass IgE: <0.1 kU/L
Maple/Box Elder IgE: <0.1 kU/L
Mucor Racemosus IgE: <0.1 kU/L
Oak, White IgE: <0.1 kU/L
Penicillium Chrysogen IgE: <0.1 kU/L
Pigweed, Rough IgE: <0.1 kU/L
Plantain, English IgE: <0.1 kU/L
Ragweed, Short IgE: <0.1 kU/L
Setomelanomma Rostrat: <0.1 kU/L
Timothy Grass IgE: <0.1 kU/L
White Mulberry IgE: <0.1 kU/L

## 2023-01-14 ENCOUNTER — Ambulatory Visit: Payer: Managed Care, Other (non HMO) | Attending: Student in an Organized Health Care Education/Training Program

## 2023-01-14 DIAGNOSIS — Z87891 Personal history of nicotine dependence: Secondary | ICD-10-CM | POA: Insufficient documentation

## 2023-01-14 DIAGNOSIS — R0602 Shortness of breath: Secondary | ICD-10-CM | POA: Insufficient documentation

## 2023-01-14 LAB — PULMONARY FUNCTION TEST ARMC ONLY
DL/VA % pred: 51 %
DL/VA: 2.26 ml/min/mmHg/L
DLCO unc % pred: 46 %
DLCO unc: 10.07 ml/min/mmHg
FEF 25-75 Post: 1.63 L/sec
FEF 25-75 Pre: 1.02 L/sec
FEF2575-%Change-Post: 60 %
FEF2575-%Pred-Post: 53 %
FEF2575-%Pred-Pre: 33 %
FEV1-%Change-Post: 18 %
FEV1-%Pred-Post: 71 %
FEV1-%Pred-Pre: 59 %
FEV1-Post: 2.12 L
FEV1-Pre: 1.78 L
FEV1FVC-%Change-Post: 7 %
FEV1FVC-%Pred-Pre: 76 %
FEV6-%Change-Post: 10 %
FEV6-%Pred-Post: 86 %
FEV6-%Pred-Pre: 78 %
FEV6-Post: 3.1 L
FEV6-Pre: 2.82 L
FEV6FVC-%Change-Post: 0 %
FEV6FVC-%Pred-Post: 101 %
FEV6FVC-%Pred-Pre: 101 %
FVC-%Change-Post: 10 %
FVC-%Pred-Post: 85 %
FVC-%Pred-Pre: 77 %
FVC-Post: 3.12 L
FVC-Pre: 2.83 L
Post FEV1/FVC ratio: 68 %
Post FEV6/FVC ratio: 99 %
Pre FEV1/FVC ratio: 63 %
Pre FEV6/FVC Ratio: 99 %
RV % pred: 149 %
RV: 2.43 L
TLC % pred: 109 %
TLC: 5.53 L

## 2023-02-17 ENCOUNTER — Telehealth: Payer: Self-pay | Admitting: Student in an Organized Health Care Education/Training Program

## 2023-02-17 ENCOUNTER — Encounter: Payer: Self-pay | Admitting: Student in an Organized Health Care Education/Training Program

## 2023-02-17 ENCOUNTER — Ambulatory Visit (INDEPENDENT_AMBULATORY_CARE_PROVIDER_SITE_OTHER): Payer: Managed Care, Other (non HMO) | Admitting: Student in an Organized Health Care Education/Training Program

## 2023-02-17 VITALS — BP 134/72 | HR 93 | Temp 97.7°F | Ht 64.0 in | Wt 256.0 lb

## 2023-02-17 DIAGNOSIS — J4520 Mild intermittent asthma, uncomplicated: Secondary | ICD-10-CM | POA: Diagnosis not present

## 2023-02-17 MED ORDER — FLUTICASONE FUROATE-VILANTEROL 100-25 MCG/ACT IN AEPB: 1.0000 | INHALATION_SPRAY | Freq: Every day | RESPIRATORY_TRACT | 11 refills | Status: DC

## 2023-02-17 NOTE — Telephone Encounter (Signed)
Patient states her insurance will charge her $300 a month for [facility] Ellipta inhaler. Needs alternative. Please advise.

## 2023-02-17 NOTE — Telephone Encounter (Signed)
I spoke with the patient. She will contact her insurance company and see what a cheaper alternative would be. She will call us back after.

## 2023-02-17 NOTE — Progress Notes (Signed)
Assessment & Plan:   1. Mild intermittent reactive airway disease without complication  The patient is presenting for the evaluation of shortness of breath with a differential that includes reactive airway disease such as asthma (given her family history), long-haul COVID, and decreased chest wall compliance secondary to obesity.  Pulmonary function testing is showing a positive response to bronchodilators and the patient confirms this. She feels better with daily use of her albuterol. Allergen testing was negative, and CBC doesn't show any eosinophilia. I did discuss this at length with the patient and will prescribe her a long acting inhaler to help with symptoms. She will use Breo Ellipta daily and I counseled her on mouth washing. Patient will also attempt to loose weight.  - fluticasone furoate-vilanterol (BREO ELLIPTA) 100-25 MCG/ACT AEPB; Inhale 1 puff into the lungs daily.  Dispense: 30 each; Refill: 11  Return in about 6 months (around 08/20/2023).  I spent 30 minutes caring for this patient today, including preparing to see the patient, obtaining a medical history , reviewing a separately obtained history, performing a medically appropriate examination and/or evaluation, counseling and educating the patient/family/caregiver, ordering medications, tests, or procedures, documenting clinical information in the electronic health record, and independently interpreting results (not separately reported/billed) and communicating results to the patient/family/caregiver  Armando Reichert, MD [facility] 02/17/2023 3:39 PM    End of visit medications:  Meds ordered this encounter  Medications   fluticasone furoate-vilanterol (BREO ELLIPTA) 100-25 MCG/ACT AEPB    Sig: Inhale 1 puff into the lungs daily.    Dispense:  30 each    Refill:  11     Current Outpatient Medications:    acetaminophen (TYLENOL) 650 MG CR tablet, Take 650 mg by mouth every 8 (eight) hours as  needed., Disp: , Rfl:    albuterol (VENTOLIN HFA) 108 (90 Base) MCG/ACT inhaler, Inhale 2 puffs into the lungs every 4 (four) hours as needed for wheezing or shortness of breath., Disp: 1 each, Rfl: 4   fluticasone furoate-vilanterol (BREO ELLIPTA) 100-25 MCG/ACT AEPB, Inhale 1 puff into the lungs daily., Disp: 30 each, Rfl: 11   Iron, Ferrous Sulfate, 325 (65 Fe) MG TABS, Take 325 mg by mouth daily., Disp: 90 tablet, Rfl: 1   meclizine (ANTIVERT) 25 MG tablet, Take 1 tablet (25 mg total) by mouth 3 (three) times daily as needed for dizziness., Disp: 30 tablet, Rfl: 0   mometasone (ELOCON) 0.1 % cream, Apply to affected area once daily or as needed., Disp: 45 g, Rfl: 2   Subjective:   PATIENT ID: Virginia Camacho GENDER: female DOB: 01/31/1981, MRN: [id number]  Chief Complaint  Patient presents with   Follow-up    PFT.     HPI  Virginia Camacho is a pleasant 42 year old female presenting to clinic for follow up.  Symptoms have been unchanged since our last visit. She's been using her albuterol inhaler daily and feels noticeable improvement in symptoms following use. She used her albuterol inhaler during her PFT's for the bronchodilator challenge part of the test. No exacerbations or worsening symptoms in the interim.   History obtained during our last visit showed her to have had COVID twice in the last 3 years, last of which was around a year and a half ago. Following her last bout with COVID, she did not feel fully recovered.  Patient reports mostly exertional dyspnea and feels winded with mild to moderate activity. Walking short distances gets her winded as does going up any  flights of stairs.    She denies any chest pain, chest tightness, wheezing, cough, sputum production or hemoptysis.  She reports no personal history of lung disease. She grew up in [place] and was exposed to extreme cold as a young adult which did not cause her to be short of breath.  She was never treated for any bronchitis  or similar presentations.  She was very active in her youth and played a lot of basketball without limitation.  She tells me that she gained a significant amount of weight which she has struggled to lose. Over the past year or so, she did gain weight, and was up to 260 pounds. Over the course of this, her shortness of breath was more noticeable.   The patient has a history of OSA for which she uses a CPAP and is generally compliant with it.  She has a history of smoking, quit 1 year ago (between 5 and 10 pack years).  She does not have any other inhalational exposures.  She does not have any pets but used to have a dog that recently passed away. She does notice some environmental allergies but has not had to be treated for them.  She lives in an apartment but does not have any mold on her house is mostly hardwood. No new exposures identified since our last visit. She works as a Financial controller. Family history is notable for severe asthma in her mother and sister.  Ancillary information including prior medications, full medical/surgical/family/social histories, and PFTs (when available) are listed below and have been reviewed.   Review of Systems  Constitutional:  Negative for chills, diaphoresis, fever, malaise/fatigue and weight loss.  Respiratory:  Positive for shortness of breath. Negative for cough, hemoptysis, sputum production and wheezing.   Cardiovascular:  Negative for chest pain.  Skin:  Negative for rash.     Objective:   Vitals:   02/17/23 1515  BP: 134/72  Pulse: 93  Temp: 97.7 F (36.5 C)  TempSrc: Temporal  SpO2: 95%  Weight: 256 lb (116.1 kg)  Height: '5\' 4"'$  (1.626 m)   95% on RA  BMI Readings from Last 3 Encounters:  02/17/23 43.94 kg/m  12/23/22 43.60 kg/m  10/21/22 44.13 kg/m   Wt Readings from Last 3 Encounters:  02/17/23 256 lb (116.1 kg)  12/23/22 254 lb (115.2 kg)  10/21/22 257 lb 1.6 oz (116.6 kg)    Physical Exam Constitutional:       General: She is not in acute distress.    Appearance: Normal appearance. She is obese. She is not ill-appearing.  HENT:     Head: Normocephalic.     Mouth/Throat:     Mouth: Mucous membranes are moist.  Eyes:     Extraocular Movements: Extraocular movements intact.     Pupils: Pupils are equal, round, and reactive to light.  Cardiovascular:     Rate and Rhythm: Normal rate.     Pulses: Normal pulses.     Heart sounds: Normal heart sounds.  Pulmonary:     Effort: Pulmonary effort is normal. No respiratory distress.     Breath sounds: Normal breath sounds. No wheezing, rhonchi or rales.  Abdominal:     Palpations: Abdomen is soft.  Musculoskeletal:     Cervical back: Normal range of motion.  Skin:    General: Skin is warm.  Neurological:     General: No focal deficit present.     Mental Status: She is alert and oriented to person,  place, and time. Mental status is at baseline.       Ancillary Information    Past Medical History:  Diagnosis Date   Allergy    Back pain    Morbid obesity with BMI of 40.0-44.9, adult ([place]) 03/20/2016     Family History  Problem Relation Age of Onset   Diabetes Mother    Breast cancer Maternal Grandmother      Past Surgical History:  Procedure Laterality Date   KNEE SURGERY Right     Social History   Socioeconomic History   Marital status: Single    Spouse name: Not on file   Number of children: Not on file   Years of education: Not on file   Highest education level: Not on file  Occupational History   Not on file  Tobacco Use   Smoking status: Former    Packs/day: 0.25    Years: 20.00    Total pack years: 5.00    Types: Cigarettes    Quit date: 2022    Years since quitting: 2.1   Smokeless tobacco: Never  Vaping Use   Vaping Use: Never used  Substance and Sexual Activity   Alcohol use: Yes    Alcohol/week: 0.0 standard drinks of alcohol    Comment: rarely   Drug use: Yes    Frequency: 1.0 times per week    Types:  Marijuana    Comment: occasional    Sexual activity: Yes    Partners: Female, Female    Birth control/protection: Condom  Other Topics Concern   Not on file  Social History Narrative   Not on file   Social Determinants of Health   Financial Resource Strain: Low Risk  (05/31/2021)   Overall Financial Resource Strain (CARDIA)    Difficulty of Paying Living Expenses: Not hard at all  Food Insecurity: No Food Insecurity (05/31/2021)   Hunger Vital Sign    Worried About Running Out of Food in the Last Year: Never true    [place]and[place] in the Last Year: Never true  Transportation Needs: No Transportation Needs (05/31/2021)   PRAPARE - Hydrologist (Medical): No    Lack of Transportation (Non-Medical): No  Physical Activity: Inactive (05/31/2021)   Exercise Vital Sign    Days of Exercise per Week: 0 days    Minutes of Exercise per Session: 0 min  Stress: No Stress Concern Present (05/31/2021)   [place]    Feeling of Stress : Only a little  Social Connections: Socially Isolated (05/31/2021)   Social Connection and Isolation Panel [NHANES]    Frequency of Communication with Friends and Family: More than three times a week    Frequency of Social Gatherings with Friends and Family: Once a week    Attends Religious Services: Never    Marine scientist or Organizations: No    Attends Archivist Meetings: Never    Marital Status: Never married  Intimate Partner Violence: Not At Risk (05/31/2021)   Humiliation, Afraid, Rape, and Kick questionnaire    Fear of Current or Ex-Partner: No    Emotionally Abused: No    Physically Abused: No    Sexually Abused: No     Allergies  Allergen Reactions   Clarithromycin Other (See Comments) and Nausea Only    Hot   Oxycodone Hives     CBC    Component Value Date/Time   WBC  5.2 12/24/2022 1130   WBC 6.6 06/14/2022 1402   RBC 6.29 (H)  12/24/2022 1130   RBC 5.54 (H) 06/14/2022 1402   HGB 14.0 12/24/2022 1130   HCT 45.4 12/24/2022 1130   PLT 356 12/24/2022 1130   MCV 72 (L) 12/24/2022 1130   MCH 22.3 (L) 12/24/2022 1130   MCH 20.6 (L) 06/14/2022 1402   MCHC 30.8 (L) 12/24/2022 1130   MCHC 30.1 (L) 06/14/2022 1402   RDW 21.4 (H) 12/24/2022 1130   LYMPHSABS 2.2 12/24/2022 1130   MONOABS 420 05/07/2017 0819   EOSABS 0.1 12/24/2022 1130   BASOSABS 0.1 12/24/2022 1130    Pulmonary Functions Testing Results:    Latest Ref Rng & Units 01/14/2023    4:34 PM  PFT Results  FVC-Pre L 2.83   FVC-Predicted Pre % 77   FVC-Post L 3.12   FVC-Predicted Post % 85   Pre FEV1/FVC % % 63   Post FEV1/FCV % % 68   FEV1-Pre L 1.78   FEV1-Predicted Pre % 59   FEV1-Post L 2.12   DLCO uncorrected ml/min/mmHg 10.07   DLCO UNC% % 46   DLVA Predicted % 51   TLC L 5.53   TLC % Predicted % 109   RV % Predicted % 149     Outpatient Medications Prior to Visit  Medication Sig Dispense Refill   acetaminophen (TYLENOL) 650 MG CR tablet Take 650 mg by mouth every 8 (eight) hours as needed.     albuterol (VENTOLIN HFA) 108 (90 Base) MCG/ACT inhaler Inhale 2 puffs into the lungs every 4 (four) hours as needed for wheezing or shortness of breath. 1 each 4   Iron, Ferrous Sulfate, 325 (65 Fe) MG TABS Take 325 mg by mouth daily. 90 tablet 1   meclizine (ANTIVERT) 25 MG tablet Take 1 tablet (25 mg total) by mouth 3 (three) times daily as needed for dizziness. 30 tablet 0   mometasone (ELOCON) 0.1 % cream Apply to affected area once daily or as needed. 45 g 2   No facility-administered medications prior to visit.

## 2023-02-18 ENCOUNTER — Telehealth: Payer: Self-pay | Admitting: Student in an Organized Health Care Education/Training Program

## 2023-02-18 DIAGNOSIS — J4520 Mild intermittent asthma, uncomplicated: Secondary | ICD-10-CM

## 2023-02-18 MED ORDER — FLUTICASONE-SALMETEROL 100-50 MCG/ACT IN AEPB: 1.0000 | INHALATION_SPRAY | Freq: Two times a day (BID) | RESPIRATORY_TRACT | 11 refills | Status: DC

## 2023-02-18 NOTE — Telephone Encounter (Signed)
Pt called to say her insurance covers [facility] and she needs that sent to [address].

## 2023-02-18 NOTE — Telephone Encounter (Signed)
Spoke to patient. She stated that [facility] is not covered by insurance. Covered alternative is [facility].   Dr. Genia Harold, please advise. Thanks

## 2023-02-18 NOTE — Telephone Encounter (Signed)
[facility] is fine, I just placed the order in. Two puffs twice daily, wash mouth afterwards.  Thanks [employer]

## 2023-02-18 NOTE — Telephone Encounter (Signed)
Patient is aware of below message and voiced her understanding.  Nothing further needed.   

## 2023-03-20 ENCOUNTER — Telehealth: Payer: Managed Care, Other (non HMO) | Admitting: Physician Assistant

## 2023-03-20 DIAGNOSIS — J208 Acute bronchitis due to other specified organisms: Secondary | ICD-10-CM

## 2023-03-20 DIAGNOSIS — B9689 Other specified bacterial agents as the cause of diseases classified elsewhere: Secondary | ICD-10-CM | POA: Diagnosis not present

## 2023-03-20 MED ORDER — BENZONATATE 100 MG PO CAPS: 100.0000 mg | ORAL_CAPSULE | Freq: Three times a day (TID) | ORAL | 0 refills | Status: DC | PRN

## 2023-03-20 MED ORDER — FLUTICASONE PROPIONATE 50 MCG/ACT NA SUSP: 2.0000 | Freq: Every day | NASAL | 0 refills | Status: DC

## 2023-03-20 MED ORDER — DOXYCYCLINE MONOHYDRATE 100 MG PO CAPS: 100.0000 mg | ORAL_CAPSULE | Freq: Two times a day (BID) | ORAL | 0 refills | Status: DC

## 2023-03-20 NOTE — Progress Notes (Signed)
Virtual Visit Consent   Virginia Camacho, you are scheduled for a virtual visit with a [facility] provider today. Just as with appointments in the office, your consent must be obtained to participate. Your consent will be active for this visit and any virtual visit you may have with one of our providers in the next 365 days. If you have a MyChart account, a copy of this consent can be sent to you electronically.  As this is a virtual visit, video technology does not allow for your provider to perform a traditional examination. This may limit your provider's ability to fully assess your condition. If your provider identifies any concerns that need to be evaluated in person or the need to arrange testing (such as labs, EKG, etc.), we will make arrangements to do so. Although advances in technology are sophisticated, we cannot ensure that it will always work on either your end or our end. If the connection with a video visit is poor, the visit may have to be switched to a telephone visit. With either a video or telephone visit, we are not always able to ensure that we have a secure connection.  By engaging in this virtual visit, you consent to the provision of healthcare and authorize for your insurance to be billed (if applicable) for the services provided during this visit. Depending on your insurance coverage, you may receive a charge related to this service.  I need to obtain your verbal consent now. Are you willing to proceed with your visit today? Virginia Camacho has provided verbal consent on 03/20/2023 for a virtual visit (video or telephone). Virginia Camacho, [place]  Date: 03/20/2023 7:06 PM  Virtual Visit via Video Note   I, Virginia Camacho, connected with  Virginia Camacho  ([id number], September 17, 1981) on 03/20/23 at  7:00 PM EDT by a video-enabled telemedicine application and verified that I am speaking with the correct person using two identifiers.  Location: Patient: Virtual Visit  Location Patient: Art gallery manager. Provider: Virtual Visit Location Provider: Home Office   I discussed the limitations of evaluation and management by telemedicine and the availability of in person appointments. The patient expressed understanding and agreed to proceed.    History of Present Illness: Virginia Camacho is a 42 y.o. who identifies as a female who was assigned female at birth, and is being seen today for increased cough and congestion over the past 2 weeks after trip from [place]. Is associated with continued nasal congestion and PND but without sinus pain. Noting chest congestion and cough has been more substantial this week and yellow/brown. Denies chest pain or SOB.   OTC -- Robitussin at night (helpful), OTC allergy and sinus tablets.   HPI: HPI  Problems:  Patient Active Problem List   Diagnosis Date Noted   COVID-19 long hauler 10/21/2022   Iron deficiency anemia 10/21/2022   Hyperlipidemia 06/14/2022   Moderate episode of recurrent major depressive disorder 06/14/2022   Anxiety 06/14/2022   Sleep apnea 05/31/2021   Class 3 severe obesity due to excess calories with serious comorbidity and body mass index (BMI) of 40.0 to 44.9 in adult 01/25/2019   Breast asymmetry on examination 05/08/2017   Dermatitis 05/07/2017   Acute pain of left shoulder 03/20/2016   Well woman exam with routine gynecological exam 03/07/2016   Breast mass, right 03/07/2016   Pain of right knee after injury 12/20/2015   Right hand pain 12/20/2015   Degeneration of intervertebral disc of lumbar region  12/20/2015   Chronic pain of right knee 12/20/2015   Headache, tension type, episodic 10/06/2015   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   Headache, migraine 10/06/2015    Allergies:  Allergies  Allergen Reactions   Clarithromycin Other (See Comments) and Nausea Only    Hot   Oxycodone Hives   Medications:  Current Outpatient Medications:    benzonatate (TESSALON) 100 MG  capsule, Take 1 capsule (100 mg total) by mouth 3 (three) times daily as needed for cough., Disp: 30 capsule, Rfl: 0   doxycycline (MONODOX) 100 MG capsule, Take 1 capsule (100 mg total) by mouth 2 (two) times daily., Disp: 14 capsule, Rfl: 0   fluticasone (FLONASE) 50 MCG/ACT nasal spray, Place 2 sprays into both nostrils daily., Disp: 16 g, Rfl: 0   acetaminophen (TYLENOL) 650 MG CR tablet, Take 650 mg by mouth every 8 (eight) hours as needed., Disp: , Rfl:    albuterol (VENTOLIN HFA) 108 (90 Base) MCG/ACT inhaler, Inhale 2 puffs into the lungs every 4 (four) hours as needed for wheezing or shortness of breath., Disp: 1 each, Rfl: 4   fluticasone-salmeterol (WIXELA INHUB) 100-50 MCG/ACT AEPB, Inhale 1 puff into the lungs 2 (two) times daily., Disp: 60 each, Rfl: 11   Iron, Ferrous Sulfate, 325 (65 Fe) MG TABS, Take 325 mg by mouth daily., Disp: 90 tablet, Rfl: 1   meclizine (ANTIVERT) 25 MG tablet, Take 1 tablet (25 mg total) by mouth 3 (three) times daily as needed for dizziness., Disp: 30 tablet, Rfl: 0   mometasone (ELOCON) 0.1 % cream, Apply to affected area once daily or as needed., Disp: 45 g, Rfl: 2  Observations/Objective: Patient is well-developed, well-nourished in no acute distress.  Resting comfortably at home.  Head is normocephalic, atraumatic.  No labored breathing.  Speech is clear and coherent with logical content.  Patient is alert and oriented at baseline.   Assessment and Plan: 1. Acute bacterial bronchitis - fluticasone (FLONASE) 50 MCG/ACT nasal spray; Place 2 sprays into both nostrils daily.  Dispense: 16 g; Refill: 0 - benzonatate (TESSALON) 100 MG capsule; Take 1 capsule (100 mg total) by mouth 3 (three) times daily as needed for cough.  Dispense: 30 capsule; Refill: 0 - doxycycline (MONODOX) 100 MG capsule; Take 1 capsule (100 mg total) by mouth 2 (two) times daily.  Dispense: 14 capsule; Refill: 0  Rx Doxycycline.  Increase fluids.  Rest.  Saline nasal spray.   Probiotic.  Mucinex as directed.  Humidifier in bedroom. Tessalon and Flonase per orders. Continue Robitussin.  Call or return to clinic if symptoms are not improving.   Follow Up Instructions: I discussed the assessment and treatment plan with the patient. The patient was provided an opportunity to ask questions and all were answered. The patient agreed with the plan and demonstrated an understanding of the instructions.  A copy of instructions were sent to the patient via MyChart unless otherwise noted below.   The patient was advised to call back or seek an in-person evaluation if the symptoms worsen or if the condition fails to improve as anticipated.  Time:  I spent 10 minutes with the patient via telehealth technology discussing the above problems/concerns.    Virginia Climes, PA-C

## 2023-03-20 NOTE — Patient Instructions (Signed)
Mariella Saa, thank you for joining Virginia Climes, PA-C for today's virtual visit.  While this provider is not your primary care provider (PCP), if your PCP is located in our provider database this encounter information will be shared with them immediately following your visit.   A [place] MyChart account gives you access to today's visit and all your visits, tests, and labs performed at [facility] " click here if you don't have a [facility] MyChart account or go to [email or web address]  Consent: (Patient) Virginia Camacho provided verbal consent for this virtual visit at the beginning of the encounter.  Current Medications:  Current Outpatient Medications:    benzonatate (TESSALON) 100 MG capsule, Take 1 capsule (100 mg total) by mouth 3 (three) times daily as needed for cough., Disp: 30 capsule, Rfl: 0   doxycycline (MONODOX) 100 MG capsule, Take 1 capsule (100 mg total) by mouth 2 (two) times daily., Disp: 14 capsule, Rfl: 0   fluticasone (FLONASE) 50 MCG/ACT nasal spray, Place 2 sprays into both nostrils daily., Disp: 16 g, Rfl: 0   acetaminophen (TYLENOL) 650 MG CR tablet, Take 650 mg by mouth every 8 (eight) hours as needed., Disp: , Rfl:    albuterol (VENTOLIN HFA) 108 (90 Base) MCG/ACT inhaler, Inhale 2 puffs into the lungs every 4 (four) hours as needed for wheezing or shortness of breath., Disp: 1 each, Rfl: 4   fluticasone-salmeterol (WIXELA INHUB) 100-50 MCG/ACT AEPB, Inhale 1 puff into the lungs 2 (two) times daily., Disp: 60 each, Rfl: 11   Iron, Ferrous Sulfate, 325 (65 Fe) MG TABS, Take 325 mg by mouth daily., Disp: 90 tablet, Rfl: 1   meclizine (ANTIVERT) 25 MG tablet, Take 1 tablet (25 mg total) by mouth 3 (three) times daily as needed for dizziness., Disp: 30 tablet, Rfl: 0   mometasone (ELOCON) 0.1 % cream, Apply to affected area once daily or as needed., Disp: 45 g, Rfl: 2   Medications ordered in this encounter:  Meds ordered this  encounter  Medications   fluticasone (FLONASE) 50 MCG/ACT nasal spray    Sig: Place 2 sprays into both nostrils daily.    Dispense:  16 g    Refill:  0    Order Specific Question:   Supervising Provider    Answer:   Merrilee Jansky [[id number]]   benzonatate (TESSALON) 100 MG capsule    Sig: Take 1 capsule (100 mg total) by mouth 3 (three) times daily as needed for cough.    Dispense:  30 capsule    Refill:  0    Order Specific Question:   Supervising Provider    Answer:   Merrilee Jansky [id number]   doxycycline (MONODOX) 100 MG capsule    Sig: Take 1 capsule (100 mg total) by mouth 2 (two) times daily.    Dispense:  14 capsule    Refill:  0    Order Specific Question:   Supervising Provider    Answer:   Merrilee Jansky [id number]     *If you need refills on other medications prior to your next appointment, please contact your pharmacy*  Follow-Up: Call back or seek an in-person evaluation if the symptoms worsen or if the condition fails to improve as anticipated.  [facility] [phone]  Other Instructions Take antibiotic (Doxycycline) as directed.  Increase fluids.  Get plenty of rest. Use Mucinex for congestion. Use the Tessalon and Flonase as directed. Take a daily probiotic (I recommend  Align or Culturelle, but even Activia Yogurt may be beneficial).  A humidifier placed in the bedroom may offer some relief for a dry, scratchy throat of nasal irritation.  Read information below on acute bronchitis. Please call or return to clinic if symptoms are not improving.  Acute Bronchitis Bronchitis is when the airways that extend from the windpipe into the lungs get red, puffy, and painful (inflamed). Bronchitis often causes thick spit (mucus) to develop. This leads to a cough. A cough is the most common symptom of bronchitis. In acute bronchitis, the condition usually begins suddenly and goes away over time (usually in 2 weeks). Smoking, allergies, and asthma can make  bronchitis worse. Repeated episodes of bronchitis may cause more lung problems.  HOME CARE Rest. Drink enough fluids to keep your pee (urine) clear or pale yellow (unless you need to limit fluids as told by your doctor). Only take over-the-counter or prescription medicines as told by your doctor. Avoid smoking and secondhand smoke. These can make bronchitis worse. If you are a smoker, think about using nicotine gum or skin patches. Quitting smoking will help your lungs heal faster. Reduce the chance of getting bronchitis again by: Washing your hands often. Avoiding people with cold symptoms. Trying not to touch your hands to your mouth, nose, or eyes. Follow up with your doctor as told.  GET HELP IF: Your symptoms do not improve after 1 week of treatment. Symptoms include: Cough. Fever. Coughing up thick spit. Body aches. Chest congestion. Chills. Shortness of breath. Sore throat.  GET HELP RIGHT AWAY IF:  You have an increased fever. You have chills. You have severe shortness of breath. You have bloody thick spit (sputum). You throw up (vomit) often. You lose too much body fluid (dehydration). You have a severe headache. You faint.  MAKE SURE YOU:  Understand these instructions. Will watch your condition. Will get help right away if you are not doing well or get worse. Document Released: 05/13/2008 Document Revised: 07/28/2013 Document Reviewed: 05/18/2013 [facility] Patient Information 2015 [place], [place]. This information is not intended to replace advice given to you by your health care provider. Make sure you discuss any questions you have with your health care provider.    If you have been instructed to have an in-person evaluation today at a local Urgent Care facility, please use the link below. It will take you to a list of all of our available [facility], including address, phone number and hours of operation. Please do not delay care.  [facility]  [facility]  If you or a family member do not have a primary care provider, use the link below to schedule a visit and establish care. When you choose a [facility] primary care physician or advanced practice provider, you gain a long-term partner in health. Find a Primary Care Provider  Learn more about [facility] in-office and virtual care options:  - Get Care Now

## 2023-04-21 ENCOUNTER — Ambulatory Visit (INDEPENDENT_AMBULATORY_CARE_PROVIDER_SITE_OTHER): Payer: Managed Care, Other (non HMO) | Admitting: Nurse Practitioner

## 2023-04-21 ENCOUNTER — Other Ambulatory Visit: Payer: Self-pay

## 2023-04-21 ENCOUNTER — Encounter: Payer: Self-pay | Admitting: Nurse Practitioner

## 2023-04-21 VITALS — BP 120/78 | HR 96 | Temp 98.7°F | Resp 16 | Ht 64.0 in | Wt 254.4 lb

## 2023-04-21 DIAGNOSIS — Z6841 Body Mass Index (BMI) 40.0 and over, adult: Secondary | ICD-10-CM

## 2023-04-21 DIAGNOSIS — F330 Major depressive disorder, recurrent, mild: Secondary | ICD-10-CM | POA: Diagnosis not present

## 2023-04-21 DIAGNOSIS — E559 Vitamin D deficiency, unspecified: Secondary | ICD-10-CM

## 2023-04-21 DIAGNOSIS — Z131 Encounter for screening for diabetes mellitus: Secondary | ICD-10-CM

## 2023-04-21 DIAGNOSIS — R079 Chest pain, unspecified: Secondary | ICD-10-CM | POA: Diagnosis not present

## 2023-04-21 DIAGNOSIS — J4520 Mild intermittent asthma, uncomplicated: Secondary | ICD-10-CM

## 2023-04-21 DIAGNOSIS — J45909 Unspecified asthma, uncomplicated: Secondary | ICD-10-CM | POA: Insufficient documentation

## 2023-04-21 DIAGNOSIS — K219 Gastro-esophageal reflux disease without esophagitis: Secondary | ICD-10-CM

## 2023-04-21 DIAGNOSIS — F331 Major depressive disorder, recurrent, moderate: Secondary | ICD-10-CM

## 2023-04-21 DIAGNOSIS — F419 Anxiety disorder, unspecified: Secondary | ICD-10-CM

## 2023-04-21 DIAGNOSIS — D509 Iron deficiency anemia, unspecified: Secondary | ICD-10-CM

## 2023-04-21 DIAGNOSIS — E66813 Obesity, class 3: Secondary | ICD-10-CM

## 2023-04-21 DIAGNOSIS — E785 Hyperlipidemia, unspecified: Secondary | ICD-10-CM

## 2023-04-21 DIAGNOSIS — G4733 Obstructive sleep apnea (adult) (pediatric): Secondary | ICD-10-CM

## 2023-04-21 MED ORDER — OMEPRAZOLE 20 MG PO CPDR
20.0000 mg | DELAYED_RELEASE_CAPSULE | Freq: Every day | ORAL | 1 refills | Status: DC
Start: 2023-04-21 — End: 2024-03-12

## 2023-04-21 NOTE — Assessment & Plan Note (Signed)
Been taking supplementation does not take it regularly.  Encouraged to take iron daily

## 2023-04-21 NOTE — Assessment & Plan Note (Signed)
Not on meds, doing okay

## 2023-04-21 NOTE — Assessment & Plan Note (Signed)
Continue to work on life style modification 

## 2023-04-21 NOTE — Assessment & Plan Note (Signed)
Patient not on medications, doing okay for now

## 2023-04-21 NOTE — Assessment & Plan Note (Signed)
Continue working on lifestyle modification, discussed [facility].

## 2023-04-21 NOTE — Assessment & Plan Note (Signed)
Encouraged to use cpap machine every times she goes to sleep.  Patient reports she will try

## 2023-04-21 NOTE — Progress Notes (Signed)
BP 120/78   Pulse 96   Temp 98.7 F (37.1 C) (Oral)   Resp 16   Ht 5\' 4"  (1.626 m)   Wt 254 lb 6.4 oz (115.4 kg)   SpO2 98%   BMI 43.67 kg/m    Subjective:    Patient ID: Virginia Camacho, female    DOB: [birth year], 42 y.o.   MRN: [id number]  HPI: Virginia Camacho is a 42 y.o. female  Chief Complaint  Patient presents with   Shortness of Breath    6 month follow yp   Reactive airway disease/Shortness of breath: Last appointment patient discussed that she had been having shortness of breath since having Covid.  She says she had covid in 11/2019. She says that she has been having noticeable increase in shortness of breath since then. She says that her girl friend has noticed that she is wheezing. She says she stopped smoking about a year ago.  She denies any fever.  Walking pulse ox dropped to 91 % with a heart rate of 135 on 06/14/2022.  Discussed she has symptoms of long covid. Started her with sample of budesonide inhaler and prescription for albuterol inhaler. Ordered a  chest xray due to history of smoking, which was negative.  Discussed with her that if no improvement would refer to pulmonology. Today patient reports she says that she is still getting short of breath. She says she also has a dry cough for about a month. She says it is random.  She says it comes and goes .  Discussed with patient that she needs to see pulmonology.  Referral was placed and she saw pulmonology on 02/17/2023. She was diagnosed with reactive airway disease and started on BREO.  She reports is doing better with her breathing.    Chest pain/GERD: she reports that she has a dull pain in the center and some times to the right of her chest.  She says the pain lasts about 30 minutes when it happens. She says that it increases when she is anxious.  She says nothing she does makes it worse other than stress.  She says sometimes taking deep breaths helps.   No shortness of breath when she has the pain.  She says she  does sometimes have acid reflux.  She says she does not take anything for her acid reflux.  Will get EKG.  EKG was normal.  She also reports family cardiac history.    Will start omeprazole and refer to cardiology.   Sleep apnea: previous appointment patient reported that she had a Sleep Study was done on 02/26/2019. She had not gotten any equipment.  She says she would  wake up multiple times a night gasping for air. We placed a new order for cpap machine and supplies.  She says she has had her cpap machine since 08/26/2022.  She currently has Lg3 type, mask tiype is Airfit f20, pressure setting 5-10. She reports she has been using her cpap machine occasionally. Recommend she use it every time she goes to sleep.   Vitamin d deficiency: Her vitamin D level was 23 on 06/14/2022.she is currently taking vitamin d supplements.   Hyperlipidemia: Her last LDL was 149 on 06/14/2022. Continue working on lifestyle modification. Will get labs today.   The 10-year ASCVD risk score (Arnett DK, et al., 2019) is: 0.7%   Obesity: her weight today is 254 lbs with a BMI of 43.67 previous weight was 256 lbs.  Encouraged to do  lifestyle modification including eating well balanced diet with portion control and increasing physical activity as tolerated.   Iron deficiency anemia: Her last h/h was 11.4/37.9. iron 43, TIBC 531, Ferritin 9. She denies any abnormal bleeding in stool or vaginal bleeding.  Patient has been taking iron supplement. She reports she has been taking her iron.  Will get labs today.    Anxiety/depression: she reports that her mental health has been doing okay, she did recently have a niece passed away.  She is doing the best she can.       04/21/2023   10:08 AM 04/21/2023    9:29 AM 10/21/2022   10:46 AM 10/21/2022    9:59 AM 06/17/2022    3:04 PM  Depression screen PHQ 2/9  Decreased Interest 0 0 0 0 0  Down, Depressed, Hopeless 0 0 0 0 0  PHQ - 2 Score 0 0 0 0 0  Altered sleeping 0  0    Tired,  decreased energy 0  0    Change in appetite 0  0    Feeling bad or failure about yourself  0  0    Trouble concentrating 0  0    Moving slowly or fidgety/restless 0  0    Suicidal thoughts 0      PHQ-9 Score 0  0    Difficult doing work/chores Not difficult at all           06/14/2022    1:23 PM 12/07/2021    8:37 AM 05/31/2021    1:13 PM  GAD 7 : Generalized Anxiety Score  Nervous, Anxious, on Edge 1 1 0  Control/stop worrying 1 2 0  Worry too much - different things 1 2 0  Trouble relaxing 0 1 0  Restless 0 1 0  Easily annoyed or irritable 0 1 0  Afraid - awful might happen 0 1 0  Total GAD 7 Score 3 9 0  Anxiety Difficulty Somewhat difficult  Not difficult at all     Relevant past medical, surgical, family and social history reviewed and updated as indicated. Interim medical history since our last visit reviewed. Allergies and medications reviewed and updated.  Review of Systems  Constitutional: Negative for fever or weight change.  Respiratory: Negative for cough and positive shortness of breath.   Cardiovascular: Negative for chest pain or palpitations.  Gastrointestinal: Negative for abdominal pain, no bowel changes.  Musculoskeletal: Negative for gait problem or joint swelling.  Skin: Negative for rash.  Neurological: Negative for dizziness or headache.  No other specific complaints in a complete review of systems (except as listed in HPI above).      Objective:    BP 120/78   Pulse 96   Temp 98.7 F (37.1 C) (Oral)   Resp 16   Ht 5\' 4"  (1.626 m)   Wt 254 lb 6.4 oz (115.4 kg)   SpO2 98%   BMI 43.67 kg/m   Wt Readings from Last 3 Encounters:  04/21/23 254 lb 6.4 oz (115.4 kg)  02/17/23 256 lb (116.1 kg)  12/23/22 254 lb (115.2 kg)    Physical Exam  Constitutional: Patient appears well-developed and well-nourished. Obese  No distress.  HEENT: head atraumatic, normocephalic, pupils equal and reactive to light, neck supple Cardiovascular: Normal rate,  regular rhythm and normal heart sounds.  No murmur heard. No BLE edema. Pulmonary/Chest: Effort normal and breath sounds wheezing. No respiratory distress. Abdominal: Soft.  There is no tenderness. Psychiatric: Patient has  a normal mood and affect. behavior is normal. Judgment and thought content normal. Pelvic exam: normal external genitalia, vulva, vagina, cervix, uterus and adnexa.    Results for orders placed or performed in visit on 01/14/23  Pulmonary Function Test [facility] Only  Result Value Ref Range   FVC-Pre 2.83 L   FVC-%Pred-Pre 77 %   FVC-Post 3.12 L   FVC-%Pred-Post 85 %   FVC-%Change-Post 10 %   FEV1-Pre 1.78 L   FEV1-%Pred-Pre 59 %   FEV1-Post 2.12 L   FEV1-%Pred-Post 71 %   FEV1-%Change-Post 18 %   FEV6-Pre 2.82 L   FEV6-%Pred-Pre 78 %   FEV6-Post 3.10 L   FEV6-%Pred-Post 86 %   FEV6-%Change-Post 10 %   Pre FEV1/FVC ratio 63 %   FEV1FVC-%Pred-Pre 76 %   Post FEV1/FVC ratio 68 %   FEV1FVC-%Change-Post 7 %   Pre FEV6/FVC Ratio 99 %   FEV6FVC-%Pred-Pre 101 %   Post FEV6/FVC ratio 99 %   FEV6FVC-%Pred-Post 101 %   FEV6FVC-%Change-Post 0 %   FEF 25-75 Pre 1.02 L/sec   FEF2575-%Pred-Pre 33 %   FEF 25-75 Post 1.63 L/sec   FEF2575-%Pred-Post 53 %   FEF2575-%Change-Post 60 %   RV 2.43 L   RV % pred 149 %   TLC 5.53 L   TLC % pred 109 %   DLCO unc 10.07 ml/min/mmHg   DLCO unc % pred 46 %   DL/VA 1.61 ml/min/mmHg/L   DL/VA % pred 51 %      Assessment & Plan:   Problem List Items Addressed This Visit       Respiratory   Sleep apnea    Encouraged to use cpap machine every times she goes to sleep.  Patient reports she will try      Reactive airway disease - Primary    Doing well with breo.  She has not had to use her albuterol inhaler at all.        Other   Class 3 severe obesity due to excess calories with serious comorbidity and body mass index (BMI) of 40.0 to 44.9 in adult [facility])    Continue working on lifestyle modification, discussed [facility].       Hyperlipidemia    Continue to work on lifestyle modification.       Relevant Orders   Lipid panel   Anxiety    Not on meds, doing okay       Iron deficiency anemia    Been taking supplementation does not take it regularly.  Encouraged to take iron daily       Relevant Orders   Iron, TIBC and Ferritin Panel   Mild episode of recurrent major depressive disorder (HCC)    Patient not on medications, doing okay for now      Vitamin D deficiency    Patient reports she continues to take vitamin D supplement.      Relevant Orders   VITAMIN D 25 Hydroxy (Vit-D Deficiency, Fractures)   RESOLVED: Moderate episode of recurrent major depressive disorder (HCC)   Other Visit Diagnoses     Chest pain, unspecified type       Relevant Orders   CBC with Differential/Platelet   COMPLETE METABOLIC PANEL WITH GFR   EKG 09-UEAV   Ambulatory referral to Cardiology   Screening for diabetes mellitus       Relevant Orders   Hemoglobin A1c   Gastroesophageal reflux disease without esophagitis  Relevant Medications   omeprazole (PRILOSEC) 20 MG capsule        Follow up plan: No follow-ups on file.

## 2023-04-21 NOTE — Assessment & Plan Note (Signed)
Doing well with breo.  She has not had to use her albuterol inhaler at all.

## 2023-04-21 NOTE — Assessment & Plan Note (Signed)
Patient reports she continues to take vitamin D supplement.

## 2023-04-22 LAB — IRON,TIBC AND FERRITIN PANEL
%SAT: 11 % (calc) — ABNORMAL LOW (ref 16–45)
Ferritin: 13 ng/mL — ABNORMAL LOW (ref 16–232)
Iron: 49 ug/dL (ref 40–190)
TIBC: 443 mcg/dL (calc) (ref 250–450)

## 2023-04-22 LAB — COMPLETE METABOLIC PANEL WITH GFR
AG Ratio: 1.3 (calc) (ref 1.0–2.5)
ALT: 13 U/L (ref 6–29)
AST: 13 U/L (ref 10–30)
Albumin: 3.9 g/dL (ref 3.6–5.1)
Alkaline phosphatase (APISO): 44 U/L (ref 31–125)
BUN: 15 mg/dL (ref 7–25)
CO2: 26 mmol/L (ref 20–32)
Calcium: 9.5 mg/dL (ref 8.6–10.2)
Chloride: 104 mmol/L (ref 98–110)
Creat: 0.74 mg/dL (ref 0.50–0.99)
Globulin: 3.1 g/dL (calc) (ref 1.9–3.7)
Glucose, Bld: 91 mg/dL (ref 65–99)
Potassium: 4.7 mmol/L (ref 3.5–5.3)
Sodium: 137 mmol/L (ref 135–146)
Total Bilirubin: 0.5 mg/dL (ref 0.2–1.2)
Total Protein: 7 g/dL (ref 6.1–8.1)
eGFR: 104 mL/min/{1.73_m2} (ref >=60)

## 2023-04-22 LAB — CBC WITH DIFFERENTIAL/PLATELET
Absolute Monocytes: 367 cells/uL (ref 200–950)
Basophils Absolute: 28 cells/uL (ref 0–200)
Basophils Relative: 0.6 %
Eosinophils Absolute: 71 cells/uL (ref 15–500)
Eosinophils Relative: 1.5 %
HCT: 43.9 % (ref 35.0–45.0)
Hemoglobin: 13.9 g/dL (ref 11.7–15.5)
Lymphs Abs: 1805 cells/uL (ref 850–3900)
MCH: 23.4 pg — ABNORMAL LOW (ref 27.0–33.0)
MCHC: 31.7 g/dL — ABNORMAL LOW (ref 32.0–36.0)
MCV: 74 fL — ABNORMAL LOW (ref 80.0–100.0)
MPV: 10.7 fL (ref 7.5–12.5)
Monocytes Relative: 7.8 %
Neutro Abs: 2430 cells/uL (ref 1500–7800)
Neutrophils Relative %: 51.7 %
Platelets: 304 10*3/uL (ref 140–400)
RBC: 5.93 10*6/uL — ABNORMAL HIGH (ref 3.80–5.10)
RDW: 15.5 % — ABNORMAL HIGH (ref 11.0–15.0)
Total Lymphocyte: 38.4 %
WBC: 4.7 10*3/uL (ref 3.8–10.8)

## 2023-04-22 LAB — VITAMIN D 25 HYDROXY (VIT D DEFICIENCY, FRACTURES): Vit D, 25-Hydroxy: 24 ng/mL — ABNORMAL LOW (ref 30–100)

## 2023-04-22 LAB — HEMOGLOBIN A1C
Hgb A1c MFr Bld: 5.9 % of total Hgb — ABNORMAL HIGH (ref <5.7)
Mean Plasma Glucose: 123 mg/dL
eAG (mmol/L): 6.8 mmol/L

## 2023-04-22 LAB — LIPID PANEL
Cholesterol: 198 mg/dL (ref <200)
HDL: 51 mg/dL (ref >=50)
LDL Cholesterol (Calc): 129 mg/dL (calc) — ABNORMAL HIGH
Non-HDL Cholesterol (Calc): 147 mg/dL (calc) — ABNORMAL HIGH (ref <130)
Total CHOL/HDL Ratio: 3.9 (calc) (ref <5.0)
Triglycerides: 85 mg/dL (ref <150)

## 2023-06-10 ENCOUNTER — Telehealth: Payer: Self-pay

## 2023-06-10 NOTE — Telephone Encounter (Signed)
Message left asking patient to return call with any cardiac history and no need to return call if no history.  

## 2023-06-10 NOTE — Telephone Encounter (Signed)
Patient is following up--Maternal grandfather had stents place in heart. Patient's mother recently found out her heart is leaking. Patient will confirm whether or not her father had a heart attack before he passed away. If additional questions, please return call to patient.

## 2023-06-18 ENCOUNTER — Encounter: Payer: Self-pay | Admitting: Cardiology

## 2023-06-18 ENCOUNTER — Other Ambulatory Visit
Admission: RE | Admit: 2023-06-18 | Discharge: 2023-06-18 | Disposition: A | Discharge status: Home / Self Care | Payer: Managed Care, Other (non HMO) | Source: Ambulatory Visit | Attending: Cardiology | Admitting: Cardiology

## 2023-06-18 ENCOUNTER — Ambulatory Visit: Payer: Managed Care, Other (non HMO) | Attending: Cardiology | Admitting: Cardiology

## 2023-06-18 VITALS — BP 138/102 | HR 96 | Ht 64.0 in | Wt 253.0 lb

## 2023-06-18 DIAGNOSIS — R072 Precordial pain: Secondary | ICD-10-CM | POA: Diagnosis present

## 2023-06-18 DIAGNOSIS — R079 Chest pain, unspecified: Secondary | ICD-10-CM | POA: Diagnosis not present

## 2023-06-18 DIAGNOSIS — E7800 Pure hypercholesterolemia, unspecified: Secondary | ICD-10-CM

## 2023-06-18 LAB — BASIC METABOLIC PANEL
Anion gap: 8 (ref 5–15)
BUN: 19 mg/dL (ref 6–20)
CO2: 22 mmol/L (ref 22–32)
Calcium: 9.1 mg/dL (ref 8.9–10.3)
Chloride: 105 mmol/L (ref 98–111)
Creatinine, Ser: 0.91 mg/dL (ref 0.44–1.00)
GFR, Estimated: >60 mL/min (ref >60)
Glucose, Bld: 92 mg/dL (ref 70–99)
Potassium: 4.2 mmol/L (ref 3.5–5.1)
Sodium: 135 mmol/L (ref 135–145)

## 2023-06-18 MED ORDER — METOPROLOL TARTRATE 100 MG PO TABS: 100.0000 mg | ORAL_TABLET | Freq: Once | ORAL | 0 refills | Status: DC

## 2023-06-18 MED ORDER — IVABRADINE HCL 5 MG PO TABS: 15.0000 mg | ORAL_TABLET | Freq: Once | ORAL | 0 refills | Status: AC

## 2023-06-18 NOTE — Progress Notes (Signed)
Cardiology Office Note:    Date:  06/18/2023   ID:  Virginia Camacho, DOB 1981-03-20, MRN [id number]  PCP:  Berniece Salines, FNP   [facility] Providers Cardiologist:  Debbe Odea, MD     Referring MD: Berniece Salines, FNP   Chief Complaint  Patient presents with   New Patient (Initial Visit)    Referred for evaluation of chest pain with no cardiac history.  Last year patient started having SOBr that was evaluated/treated by pulmonology.  Also intermittent chest pain with normal EKG at PCP office.  Feeling heart palpitations during exertion.  Mother recently diagnosed with "leaky heart valve".  Blood pressure is elevated on office check today and patient states she has been anxious about this appt for past few days.   Virginia Camacho is a 42 y.o. female who is being seen today for the evaluation of chest pain at the request of Berniece Salines, FNP.   History of Present Illness:    Virginia Camacho is a 42 y.o. female with a hx of hyperlipidemia, morbid obesity, former smoker x 15 years presenting with chest pain.  Heart episodes of chest pain not associated with exertion occurring roughly 2 months ago.  Symptoms usually lasts for a few seconds.  Endorses shortness of breath with exertion, followed up with pulmonary medicine, albuterol inhaler was provided which helped with her symptoms.  She states smoking sparingly in the past for about 15 years.  Quit 3 years ago.  Working on losing weight, has lost 10 pounds so far with improving her diet and exercise.  States being anxious today, her blood pressure is usually well-controlled at home.  Past Medical History:  Diagnosis Date   Allergy    Back pain    Morbid obesity with BMI of 40.0-44.9, adult (HCC) 03/20/2016    Past Surgical History:  Procedure Laterality Date   KNEE SURGERY Right     Current Medications: Current Meds  Medication Sig   acetaminophen (TYLENOL) 650 MG CR tablet Take 650 mg by mouth every 8  (eight) hours as needed.   Iron, Ferrous Sulfate, 325 (65 Fe) MG TABS Take 325 mg by mouth daily.   ivabradine (CORLANOR) 5 MG TABS tablet Take 3 tablets (15 mg total) by mouth once for 1 dose. TWO HOURS PRIOR TO CARDIAC CTA   meclizine (ANTIVERT) 25 MG tablet Take 1 tablet (25 mg total) by mouth 3 (three) times daily as needed for dizziness.   metoprolol tartrate (LOPRESSOR) 100 MG tablet Take 1 tablet (100 mg total) by mouth once for 1 dose. TWO HOURS PRIOR TO CARDIAC CTA   mometasone (ELOCON) 0.1 % cream Apply to affected area once daily or as needed.   omeprazole (PRILOSEC) 20 MG capsule Take 1 capsule (20 mg total) by mouth daily.     Allergies:   Clarithromycin and Oxycodone   Social History   Socioeconomic History   Marital status: Single    Spouse name: Not on file   Number of children: Not on file   Years of education: Not on file   Highest education level: Not on file  Occupational History   Not on file  Tobacco Use   Smoking status: Former    Packs/day: 0.25    Years: 20.00    Additional pack years: 0.00    Total pack years: 5.00    Types: Cigarettes    Quit date: 2022    Years since quitting: 2.5   Smokeless  tobacco: Never  Vaping Use   Vaping Use: Never used  Substance and Sexual Activity   Alcohol use: Yes    Alcohol/week: 0.0 standard drinks of alcohol    Comment: rarely   Drug use: Yes    Frequency: 1.0 times per week    Types: Marijuana    Comment: occasional    Sexual activity: Yes    Partners: Female, Female    Birth control/protection: Condom  Other Topics Concern   Not on file  Social History Narrative   Not on file   Social Determinants of Health   Financial Resource Strain: Low Risk  (05/31/2021)   Overall Financial Resource Strain (CARDIA)    Difficulty of Paying Living Expenses: Not hard at all  Food Insecurity: No Food Insecurity (05/31/2021)   Hunger Vital Sign    Worried About Running Out of Food in the Last Year: Never true    Ran Out  of Food in the Last Year: Never true  Transportation Needs: No Transportation Needs (05/31/2021)   PRAPARE - Administrator, Civil Service (Medical): No    Lack of Transportation (Non-Medical): No  Physical Activity: Inactive (05/31/2021)   Exercise Vital Sign    Days of Exercise per Week: 0 days    Minutes of Exercise per Session: 0 min  Stress: No Stress Concern Present (05/31/2021)   [employer] of Occupational Health - Occupational Stress Questionnaire    Feeling of Stress : Only a little  Social Connections: Socially Isolated (05/31/2021)   Social Connection and Isolation Panel [NHANES]    Frequency of Communication with Friends and Family: More than three times a week    Frequency of Social Gatherings with Friends and Family: Once a week    Attends Religious Services: Never    Database administrator or Organizations: No    Attends Engineer, structural: Never    Marital Status: Never married     Family History: The patient's family history includes Breast cancer in her maternal grandmother; Diabetes in her mother; Heart disease in her maternal grandfather and mother.  ROS:   Please see the history of present illness.     All other systems reviewed and are negative.  EKGs/Labs/Other Studies Reviewed:    The following studies were reviewed today:  EKG Interpretation Date/Time:  Wednesday June 18 2023 11:19:35 EDT Ventricular Rate:  96 PR Interval:  160 QRS Duration:  60 QT Interval:  342 QTC Calculation: 432 R Axis:   41  Text Interpretation: Normal sinus rhythm Normal ECG No previous ECGs available Confirmed by Debbe Odea ([id number]) on 06/18/2023 11:37:54 AM    Recent Labs: 04/21/2023: ALT 13; BUN 15; Creat 0.74; Hemoglobin 13.9; Platelets 304; Potassium 4.7; Sodium 137  Recent Lipid Panel    Component Value Date/Time   CHOL 198 04/21/2023 1006   CHOL 208 (H) 03/07/2016 1102   TRIG 85 04/21/2023 1006   HDL 51 04/21/2023 1006   HDL 43  03/07/2016 1102   CHOLHDL 3.9 04/21/2023 1006   VLDL 18 05/07/2017 0819   LDLCALC 129 (H) 04/21/2023 1006     Risk Assessment/Calculations:     HYPERTENSION CONTROL Vitals:   06/18/23 1120 06/18/23 1122 06/18/23 1123  BP: (!) 136/102 (!) 134/102 (!) 138/102    The patient's blood pressure is elevated above target today.  In order to address the patient's elevated BP: Blood pressure will be monitored at home to determine if medication changes need to be made.  Physical Exam:    VS:  BP (!) 138/102 (BP Location: Left Arm, Patient Position: Sitting, Cuff Size: Large)   Pulse 96   Ht 5\' 4"  (1.626 m)   Wt 253 lb (114.8 kg)   SpO2 97%   BMI 43.43 kg/m     Wt Readings from Last 3 Encounters:  06/18/23 253 lb (114.8 kg)  04/21/23 254 lb 6.4 oz (115.4 kg)  02/17/23 256 lb (116.1 kg)     GEN:  Well nourished, well developed in no acute distress HEENT: Normal NECK: No JVD; No carotid bruits CARDIAC: RRR, no murmurs, rubs, gallops RESPIRATORY:  Clear to auscultation without rales, wheezing or rhonchi  ABDOMEN: Soft, non-tender, non-distended MUSCULOSKELETAL:  No edema; No deformity  SKIN: Warm and dry NEUROLOGIC:  Alert and oriented x 3 PSYCHIATRIC:  Normal affect   ASSESSMENT:    1. Precordial pain   2. Pure hypercholesterolemia   3. Morbid obesity (HCC)   4. Chest pain, unspecified type    PLAN:    In order of problems listed above:  Chest pain, risk factors former smoker, obesity, hyperlipidemia.  Obtain echo, obtain coronary CT.  Morbid obesity, deconditioning could be contributing to symptoms of shortness of breath. Hyperlipidemia, not in statin benefit group, low-cholesterol diet advised. Morbid obesity, low-calorie diet, weight loss advised.  Follow-up after echo and coronary CT.      Medication Adjustments/Labs and Tests Ordered: Current medicines are reviewed at length with the patient today.  Concerns regarding medicines are outlined  above.  Orders Placed This Encounter  Procedures   CT CORONARY MORPH W/CTA COR W/SCORE W/CA W/CM &/OR WO/CM   Basic Metabolic Panel (BMET)   EKG 12-Lead   ECHOCARDIOGRAM COMPLETE   Meds ordered this encounter  Medications   metoprolol tartrate (LOPRESSOR) 100 MG tablet    Sig: Take 1 tablet (100 mg total) by mouth once for 1 dose. TWO HOURS PRIOR TO CARDIAC CTA    Dispense:  1 tablet    Refill:  0   ivabradine (CORLANOR) 5 MG TABS tablet    Sig: Take 3 tablets (15 mg total) by mouth once for 1 dose. TWO HOURS PRIOR TO CARDIAC CTA    Dispense:  3 tablet    Refill:  0    CASH PRICE PLEASE    Patient Instructions  Medication Instructions:   Your physician recommends that you continue on your current medications as directed. Please refer to the Current Medication list given to you today.  *If you need a refill on your cardiac medications before your next appointment, please call your pharmacy*   Lab Work:  Your physician recommends you go to the medical mall for labs. BMP  If you have labs (blood work) drawn today and your tests are completely normal, you will receive your results only by: MyChart Message (if you have MyChart) OR A paper copy in the mail If you have any lab test that is abnormal or we need to change your treatment, we will call you to review the results.   Testing/Procedures:  Your physician has requested that you have an echocardiogram. Echocardiography is a painless test that uses sound waves to create images of your heart. It provides your doctor with information about the size and shape of your heart and how well your heart's chambers and valves are working. This procedure takes approximately one hour. There are no restrictions for this procedure. Please do NOT wear cologne, perfume, aftershave, or lotions (deodorant is allowed). Please arrive  15 minutes prior to your appointment time.    Your cardiac CT will be scheduled at:  [facility] @ [facility] [address] [place], [place] [address] [phone]  Please arrive 15 mins early for check-in and test prep.  There is spacious parking and easy access to the radiology department from the [facility] entrance. Please enter here and check-in with the desk attendant.    Please follow these instructions carefully (unless otherwise directed):  An IV will be required for this test and Nitroglycerin will be given.  Hold all erectile dysfunction medications at least 3 days (72 hrs) prior to test. (Ie viagra, cialis, sildenafil, tadalafil, etc)   On the Night Before the Test: Be sure to Drink plenty of water. Do not consume any caffeinated/decaffeinated beverages or chocolate 12 hours prior to your test. Do not take any antihistamines 12 hours prior to your test.  On the Day of the Test: Drink plenty of water until 1 hour prior to the test. Do not eat any food 1 hour prior to test. You may take your regular medications prior to the test.  Take metoprolol (Lopressor) two hours prior to test. Take Corlanor two hours prior to test FEMALES- please wear underwire-free bra if available, avoid dresses & tight clothing      After the Test: Drink plenty of water. After receiving IV contrast, you may experience a mild flushed feeling. This is normal. On occasion, you may experience a mild rash up to 24 hours after the test. This is not dangerous. If this occurs, you can take Benadryl 25 mg and increase your fluid intake. If you experience trouble breathing, this can be serious. If it is severe call 911 IMMEDIATELY. If it is mild, please call our office. If you take any of these medications: Glipizide/Metformin, Avandament, Glucavance, please do not take 48 hours after completing test unless otherwise instructed.  We will call to schedule your test 2-4 weeks out understanding that some insurance companies will need an authorization prior to the service being  performed.   For more information and frequently asked questions, please visit our website : [email or web address]  For non-scheduling related questions, please contact the cardiac imaging nurse navigator should you have any questions/concerns: Rockwell Alexandria, Cardiac Imaging Nurse Navigator Larey Brick, Cardiac Imaging Nurse Navigator [facility] Direct Office Dial: [phone]   For scheduling needs, including cancellations and rescheduling, please call [place], [phone].   Follow-Up: At [facility], you and your health needs are our priority.  As part of our continuing mission to provide you with exceptional heart care, we have created designated Provider Care Teams.  These Care Teams include your primary Cardiologist (physician) and Advanced Practice Providers (APPs -  Physician Assistants and Nurse Practitioners) who all work together to provide you with the care you need, when you need it.  We recommend signing up for the patient portal called "MyChart".  Sign up information is provided on this After Visit Summary.  MyChart is used to connect with patients for Virtual Visits (Telemedicine).  Patients are able to view lab/test results, encounter notes, upcoming appointments, etc.  Non-urgent messages can be sent to your provider as well.   To learn more about what you can do with MyChart, go to [email or web address].    Your next appointment:    After testing  Provider:   You may see Debbe Odea, MD or one of the following Advanced Practice Providers on  your designated Care Team:   Nicolasa Ducking, NP Eula Listen, PA-C Cadence Fransico Michael, PA-C Charlsie Quest, NP    Signed, Debbe Odea, MD  06/18/2023 12:37 PM    [facility]

## 2023-06-18 NOTE — Patient Instructions (Signed)
Medication Instructions:   Your physician recommends that you continue on your current medications as directed. Please refer to the Current Medication list given to you today.  *If you need a refill on your cardiac medications before your next appointment, please call your pharmacy*   Lab Work:  Your physician recommends you go to the medical mall for labs. BMP  If you have labs (blood work) drawn today and your tests are completely normal, you will receive your results only by: MyChart Message (if you have MyChart) OR A paper copy in the mail If you have any lab test that is abnormal or we need to change your treatment, we will call you to review the results.   Testing/Procedures:  Your physician has requested that you have an echocardiogram. Echocardiography is a painless test that uses sound waves to create images of your heart. It provides your doctor with information about the size and shape of your heart and how well your heart's chambers and valves are working. This procedure takes approximately one hour. There are no restrictions for this procedure. Please do NOT wear cologne, perfume, aftershave, or lotions (deodorant is allowed). Please arrive 15 minutes prior to your appointment time.    Your cardiac CT will be scheduled at:  [facility] @ [facility] [address] [place], [place] [address] [phone]  Please arrive 15 mins early for check-in and test prep.  There is spacious parking and easy access to the radiology department from the [facility] entrance. Please enter here and check-in with the desk attendant.    Please follow these instructions carefully (unless otherwise directed):  An IV will be required for this test and Nitroglycerin will be given.  Hold all erectile dysfunction medications at least 3 days (72 hrs) prior to test. (Ie viagra, cialis, sildenafil, tadalafil, etc)   On the Night Before the Test: Be sure to Drink  plenty of water. Do not consume any caffeinated/decaffeinated beverages or chocolate 12 hours prior to your test. Do not take any antihistamines 12 hours prior to your test.  On the Day of the Test: Drink plenty of water until 1 hour prior to the test. Do not eat any food 1 hour prior to test. You may take your regular medications prior to the test.  Take metoprolol (Lopressor) two hours prior to test. Take Corlanor two hours prior to test FEMALES- please wear underwire-free bra if available, avoid dresses & tight clothing      After the Test: Drink plenty of water. After receiving IV contrast, you may experience a mild flushed feeling. This is normal. On occasion, you may experience a mild rash up to 24 hours after the test. This is not dangerous. If this occurs, you can take Benadryl 25 mg and increase your fluid intake. If you experience trouble breathing, this can be serious. If it is severe call 911 IMMEDIATELY. If it is mild, please call our office. If you take any of these medications: Glipizide/Metformin, Avandament, Glucavance, please do not take 48 hours after completing test unless otherwise instructed.  We will call to schedule your test 2-4 weeks out understanding that some insurance companies will need an authorization prior to the service being performed.   For more information and frequently asked questions, please visit our website : [email or web address]  For non-scheduling related questions, please contact the cardiac imaging nurse navigator should you have any questions/concerns: Rockwell Alexandria, Cardiac Imaging Nurse Navigator Larey Brick, Cardiac Imaging Nurse Navigator  [facility] Direct Office Dial: [phone]   For scheduling needs, including cancellations and rescheduling, please call [place], [phone].   Follow-Up: At [facility], you and your health needs are our priority.  As part of our continuing  mission to provide you with exceptional heart care, we have created designated Provider Care Teams.  These Care Teams include your primary Cardiologist (physician) and Advanced Practice Providers (APPs -  Physician Assistants and Nurse Practitioners) who all work together to provide you with the care you need, when you need it.  We recommend signing up for the patient portal called "MyChart".  Sign up information is provided on this After Visit Summary.  MyChart is used to connect with patients for Virtual Visits (Telemedicine).  Patients are able to view lab/test results, encounter notes, upcoming appointments, etc.  Non-urgent messages can be sent to your provider as well.   To learn more about what you can do with MyChart, go to [email or web address].    Your next appointment:    After testing  Provider:   You may see Debbe Odea, MD or one of the following Advanced Practice Providers on your designated Care Team:   Nicolasa Ducking, NP Eula Listen, PA-C Cadence Fransico Michael, PA-C Charlsie Quest, NP

## 2023-07-02 ENCOUNTER — Telehealth: Payer: Self-pay | Admitting: Cardiology

## 2023-07-02 NOTE — Telephone Encounter (Signed)
Called and informed that for the CTA it requires IV contrast. Patient verbalizes understanding.

## 2023-07-02 NOTE — Telephone Encounter (Signed)
Patient stated she is concerned about having to have an IV for upcoming test and wants to know if she can get a drink instead of the IV.

## 2023-07-14 ENCOUNTER — Ambulatory Visit: Payer: Managed Care, Other (non HMO)

## 2023-07-15 ENCOUNTER — Other Ambulatory Visit: Payer: Managed Care, Other (non HMO)

## 2023-07-16 ENCOUNTER — Ambulatory Visit: Payer: Managed Care, Other (non HMO)

## 2023-08-04 ENCOUNTER — Encounter (HOSPITAL_COMMUNITY): Payer: Self-pay

## 2023-08-15 ENCOUNTER — Encounter (HOSPITAL_COMMUNITY): Payer: Self-pay

## 2023-08-26 ENCOUNTER — Ambulatory Visit: Payer: Managed Care, Other (non HMO) | Admitting: Cardiology

## 2023-09-23 ENCOUNTER — Telehealth: Payer: Managed Care, Other (non HMO) | Admitting: Physician Assistant

## 2023-09-23 DIAGNOSIS — B9689 Other specified bacterial agents as the cause of diseases classified elsewhere: Secondary | ICD-10-CM

## 2023-09-23 DIAGNOSIS — J208 Acute bronchitis due to other specified organisms: Secondary | ICD-10-CM

## 2023-09-23 MED ORDER — PREDNISONE 20 MG PO TABS: 40.0000 mg | ORAL_TABLET | Freq: Every day | ORAL | 0 refills | Status: DC

## 2023-09-23 MED ORDER — DOXYCYCLINE HYCLATE 100 MG PO TABS
100.0000 mg | ORAL_TABLET | Freq: Two times a day (BID) | ORAL | 0 refills | Status: DC
Start: 2023-09-23 — End: 2023-10-22

## 2023-09-23 MED ORDER — BENZONATATE 100 MG PO CAPS
100.0000 mg | ORAL_CAPSULE | Freq: Three times a day (TID) | ORAL | 0 refills | Status: DC | PRN
Start: 2023-09-23 — End: 2023-10-22

## 2023-09-23 NOTE — Progress Notes (Signed)
Message sent to patient requesting further input regarding current symptoms. Awaiting patient response.  

## 2023-09-23 NOTE — Progress Notes (Signed)

## 2023-10-06 ENCOUNTER — Encounter: Payer: Self-pay | Admitting: Cardiology

## 2023-10-22 ENCOUNTER — Other Ambulatory Visit: Payer: Self-pay

## 2023-10-22 ENCOUNTER — Encounter: Payer: Self-pay | Admitting: Nurse Practitioner

## 2023-10-22 ENCOUNTER — Ambulatory Visit: Payer: Managed Care, Other (non HMO) | Admitting: Nurse Practitioner

## 2023-10-22 ENCOUNTER — Ambulatory Visit (INDEPENDENT_AMBULATORY_CARE_PROVIDER_SITE_OTHER): Payer: Managed Care, Other (non HMO) | Admitting: Nurse Practitioner

## 2023-10-22 VITALS — BP 124/72 | Temp 97.9°F | Resp 16 | Ht 63.0 in | Wt 243.7 lb

## 2023-10-22 DIAGNOSIS — G4733 Obstructive sleep apnea (adult) (pediatric): Secondary | ICD-10-CM

## 2023-10-22 DIAGNOSIS — F419 Anxiety disorder, unspecified: Secondary | ICD-10-CM

## 2023-10-22 DIAGNOSIS — D509 Iron deficiency anemia, unspecified: Secondary | ICD-10-CM

## 2023-10-22 DIAGNOSIS — Z131 Encounter for screening for diabetes mellitus: Secondary | ICD-10-CM

## 2023-10-22 DIAGNOSIS — F330 Major depressive disorder, recurrent, mild: Secondary | ICD-10-CM

## 2023-10-22 DIAGNOSIS — K219 Gastro-esophageal reflux disease without esophagitis: Secondary | ICD-10-CM

## 2023-10-22 DIAGNOSIS — E559 Vitamin D deficiency, unspecified: Secondary | ICD-10-CM

## 2023-10-22 DIAGNOSIS — E785 Hyperlipidemia, unspecified: Secondary | ICD-10-CM

## 2023-10-22 DIAGNOSIS — Z6841 Body Mass Index (BMI) 40.0 and over, adult: Secondary | ICD-10-CM

## 2023-10-22 DIAGNOSIS — Z23 Encounter for immunization: Secondary | ICD-10-CM | POA: Diagnosis not present

## 2023-10-22 DIAGNOSIS — J4520 Mild intermittent asthma, uncomplicated: Secondary | ICD-10-CM

## 2023-10-22 DIAGNOSIS — R051 Acute cough: Secondary | ICD-10-CM

## 2023-10-22 MED ORDER — ALBUTEROL SULFATE HFA 108 (90 BASE) MCG/ACT IN AERS
2.0000 | INHALATION_SPRAY | Freq: Four times a day (QID) | RESPIRATORY_TRACT | 0 refills | Status: DC | PRN
Start: 2023-10-22 — End: 2024-02-11

## 2023-10-22 MED ORDER — PROMETHAZINE-DM 6.25-15 MG/5ML PO SYRP
5.0000 mL | ORAL_SOLUTION | Freq: Four times a day (QID) | ORAL | 0 refills | Status: DC | PRN
Start: 2023-10-22 — End: 2024-03-12

## 2023-10-22 MED ORDER — FLUTICASONE-SALMETEROL 100-50 MCG/ACT IN AEPB
1.0000 | INHALATION_SPRAY | Freq: Two times a day (BID) | RESPIRATORY_TRACT | 11 refills | Status: DC
Start: 2023-10-22 — End: 2023-10-28

## 2023-10-22 NOTE — Assessment & Plan Note (Signed)
Encouraged to use cpap nightly

## 2023-10-22 NOTE — Assessment & Plan Note (Signed)
Discussed using inhaler as prescribed.

## 2023-10-22 NOTE — Assessment & Plan Note (Signed)
Taking iron supplement

## 2023-10-22 NOTE — Assessment & Plan Note (Signed)
In remission.

## 2023-10-22 NOTE — Assessment & Plan Note (Signed)
 Continue vitamin d supplement

## 2023-10-22 NOTE — Progress Notes (Signed)
BP 124/72   Temp 97.9 F (36.6 C) (Oral)   Resp 16   Ht 5\' 3"  (1.6 m)   Wt 243 lb 11.2 oz (110.5 kg)   SpO2 94%   BMI 43.17 kg/m    Subjective:    Patient ID: Virginia Camacho, female    DOB: 1981/08/04, 42 y.o.   MRN: [id number]  HPI: Virginia Camacho is a 42 y.o. female  Chief Complaint  Patient presents with   Medical Management of Chronic Issues   Cough    Lingering cough for 1 month. Dx with bronchitis   Reactive airway disease/Shortness of breath: Last appointment patient discussed that she had been having shortness of breath since having Covid.  She says she had covid in 11/2019. She says that she has been having noticeable increase in shortness of breath since then. She says that her girl friend has noticed that she is wheezing. She says she stopped smoking about a year ago.  She denies any fever.  Walking pulse ox dropped to 91 % with a heart rate of 135 on 06/14/2022.  Discussed she has symptoms of long covid. Started her with sample of budesonide inhaler and prescription for albuterol inhaler. Ordered a  chest xray due to history of smoking, which was negative.  Discussed with her that if no improvement would refer to pulmonology. Today patient reports she says that she is still getting short of breath. She says she also has a dry cough for about a month. She says it is random.  She says it comes and goes .  Discussed with patient that she needs to see pulmonology.  Referral was placed and she saw pulmonology on 02/17/2023. She was diagnosed with reactive airway disease and started on wexela. She has not been using her inhaler. Today patient reports she still has a cough.  She reports she is doing better than before. She was treated with doxy and prednisone.    Chest pain/GERD: she reports that she has a dull pain in the center and some times to the right of her chest.  She says the pain lasts about 30 minutes when it happens. She says that it increases when she is anxious.  She  says nothing she does makes it worse other than stress.  She says sometimes taking deep breaths helps.   No shortness of breath when she has the pain.  She says she does sometimes have acid reflux.  She also reports family cardiac history.    She was started on omeprazole and referred to cardiology. She saw cardiology on 06/18/2023. Echo and coronary ct ordered, but has not been done.  She is going to hold off on the ct for now.   Sleep apnea: previous appointment patient reported that she had a Sleep Study was done on 02/26/2019. She had not gotten any equipment.  She says she would  wake up multiple times a night gasping for air. We placed a new order for cpap machine and supplies.  She says she has had her cpap machine since 08/26/2022.  She currently has Lg3 type, mask tiype is Airfit f20, pressure setting 5-10. She reports she has been using her cpap machine occasionally. Recommend she use it every time she goes to sleep.  Patient reports she says she is getting better wearing it but still not doing it every day.    Vitamin d deficiency: Her vitamin D level was 23 on 06/14/2022.she is currently taking vitamin d supplements.  HLD:  -Medications: none -Patient working on lifestyle modification -Last lipid panel:  Lipid Panel     Component Value Date/Time   CHOL 198 04/21/2023 1006   CHOL 208 (H) 03/07/2016 1102   TRIG 85 04/21/2023 1006   HDL 51 04/21/2023 1006   HDL 43 03/07/2016 1102   CHOLHDL 3.9 04/21/2023 1006   VLDL 18 05/07/2017 0819   LDLCALC 129 (H) 04/21/2023 1006   LABVLDL 15 03/07/2016 1102    The 10-year ASCVD risk score (Arnett DK, et al., 2019) is: 0.8%   Obesity:  Current weight : 243 lbs BMI: 43.17 previous weight:254 lbs Treatment Tried: lifestyle modification Comorbidities: HLD, anxiety, depression, GERD   Iron deficiency anemia:  She denies any abnormal bleeding in stool or vaginal bleeding.  Patient has been taking iron supplement. She reports she has been taking  her iron.   Iron/TIBC/Ferritin/ %Sat    Component Value Date/Time   IRON 49 04/21/2023 1006   TIBC 443 04/21/2023 1006   FERRITIN 13 (L) 04/21/2023 1006   IRONPCTSAT 11 (L) 04/21/2023 1006       Latest Ref Rng & Units 04/21/2023   10:06 AM 12/24/2022   11:30 AM 06/14/2022    2:02 PM  CBC  WBC 3.8 - 10.8 Thousand/uL 4.7  5.2  6.6   Hemoglobin 11.7 - 15.5 g/dL 95.6  38.7  56.4   Hematocrit 35.0 - 45.0 % 43.9  45.4  37.9   Platelets 140 - 400 Thousand/uL 304  356  414      Anxiety/depression: not currently on medication. Doing well. Stable.       10/22/2023    3:11 PM 04/21/2023   10:08 AM 04/21/2023    9:29 AM 10/21/2022   10:46 AM 10/21/2022    9:59 AM  Depression screen PHQ 2/9  Decreased Interest 0 0 0 0 0  Down, Depressed, Hopeless  0 0 0 0  PHQ - 2 Score 0 0 0 0 0  Altered sleeping  0  0   Tired, decreased energy  0  0   Change in appetite  0  0   Feeling bad or failure about yourself   0  0   Trouble concentrating  0  0   Moving slowly or fidgety/restless  0  0   Suicidal thoughts  0     PHQ-9 Score  0  0   Difficult doing work/chores  Not difficult at all          06/14/2022    1:23 PM 12/07/2021    8:37 AM 05/31/2021    1:13 PM  GAD 7 : Generalized Anxiety Score  Nervous, Anxious, on Edge 1 1 0  Control/stop worrying 1 2 0  Worry too much - different things 1 2 0  Trouble relaxing 0 1 0  Restless 0 1 0  Easily annoyed or irritable 0 1 0  Afraid - awful might happen 0 1 0  Total GAD 7 Score 3 9 0  Anxiety Difficulty Somewhat difficult  Not difficult at all     Relevant past medical, surgical, family and social history reviewed and updated as indicated. Interim medical history since our last visit reviewed. Allergies and medications reviewed and updated.  Review of Systems  Constitutional: Negative for fever or weight change.  Respiratory: Negative for cough and positive shortness of breath.   Cardiovascular: Negative for chest pain or palpitations.   Gastrointestinal: Negative for abdominal pain, no bowel changes.  Musculoskeletal: Negative  for gait problem or joint swelling.  Skin: Negative for rash.  Neurological: Negative for dizziness or headache.  No other specific complaints in a complete review of systems (except as listed in HPI above).      Objective:    BP 124/72   Temp 97.9 F (36.6 C) (Oral)   Resp 16   Ht 5\' 3"  (1.6 m)   Wt 243 lb 11.2 oz (110.5 kg)   SpO2 94%   BMI 43.17 kg/m   Wt Readings from Last 3 Encounters:  10/22/23 243 lb 11.2 oz (110.5 kg)  06/18/23 253 lb (114.8 kg)  04/21/23 254 lb 6.4 oz (115.4 kg)    Physical Exam  Constitutional: Patient appears well-developed and well-nourished. Obese  No distress.  HEENT: head atraumatic, normocephalic, pupils equal and reactive to light, neck supple Cardiovascular: Normal rate, regular rhythm and normal heart sounds.  No murmur heard. No BLE edema. Pulmonary/Chest: Effort normal and breath sounds wheezing. No respiratory distress. Abdominal: Soft.  There is no tenderness. Psychiatric: Patient has a normal mood and affect. behavior is normal. Judgment and thought content normal. Pelvic exam: normal external genitalia, vulva, vagina, cervix, uterus and adnexa.    Results for orders placed or performed during the hospital encounter of 06/18/23  Basic Metabolic Panel (BMET)  Result Value Ref Range   Sodium 135 135 - 145 mmol/L   Potassium 4.2 3.5 - 5.1 mmol/L   Chloride 105 98 - 111 mmol/L   CO2 22 22 - 32 mmol/L   Glucose, Bld 92 70 - 99 mg/dL   BUN 19 6 - 20 mg/dL   Creatinine, Ser 2.13 0.44 - 1.00 mg/dL   Calcium 9.1 8.9 - 08.6 mg/dL   GFR, Estimated >57 >84 mL/min   Anion gap 8 5 - 15      Assessment & Plan:   Problem List Items Addressed This Visit       Respiratory   Sleep apnea    Encouraged to use cpap nightly      Reactive airway disease - Primary    Discussed using inhaler as prescribed.       Relevant Medications   albuterol  (VENTOLIN HFA) 108 (90 Base) MCG/ACT inhaler   fluticasone-salmeterol (WIXELA INHUB) 100-50 MCG/ACT AEPB     Other   Class 3 severe obesity due to excess calories with serious comorbidity and body mass index (BMI) of 40.0 to 44.9 in adult [facility])    Patient has had some weight loss, continue working on lifestyle modification      Hyperlipidemia    Working on lifestyle modification      Relevant Orders   COMPLETE METABOLIC PANEL WITH GFR   Lipid panel   Anxiety    In remission      Iron deficiency anemia    Taking iron supplement      Relevant Orders   CBC with Differential/Platelet   Iron, TIBC and Ferritin Panel   Mild episode of recurrent major depressive disorder (HCC)    In remission      Vitamin D deficiency    Continue vitamin d supplement      Relevant Orders   VITAMIN D 25 Hydroxy (Vit-D Deficiency, Fractures)   Other Visit Diagnoses     Gastroesophageal reflux disease without esophagitis       Screening for diabetes mellitus       Relevant Orders   COMPLETE METABOLIC PANEL WITH GFR   Hemoglobin A1c   Acute cough  Relevant Medications   promethazine-dextromethorphan (PROMETHAZINE-DM) 6.25-15 MG/5ML syrup   albuterol (VENTOLIN HFA) 108 (90 Base) MCG/ACT inhaler   Need for influenza vaccination       Relevant Orders   Flu vaccine trivalent PF, 6mos and older(Flulaval,Afluria,Fluarix,Fluzone) (Completed)         Follow up plan: Return in about 6 months (around 04/20/2024) for follow up.

## 2023-10-22 NOTE — Assessment & Plan Note (Signed)
Patient has had some weight loss, continue working on lifestyle modification

## 2023-10-22 NOTE — Assessment & Plan Note (Signed)
Working on lifestyle modification. 

## 2023-10-23 LAB — LIPID PANEL
Cholesterol: 208 mg/dL — ABNORMAL HIGH (ref <200)
HDL: 46 mg/dL — ABNORMAL LOW (ref >=50)
LDL Cholesterol (Calc): 141 mg/dL — ABNORMAL HIGH
Non-HDL Cholesterol (Calc): 162 mg/dL — ABNORMAL HIGH (ref <130)
Total CHOL/HDL Ratio: 4.5 (calc) (ref <5.0)
Triglycerides: 101 mg/dL (ref <150)

## 2023-10-23 LAB — HEMOGLOBIN A1C
Hgb A1c MFr Bld: 5.8 %{Hb} — ABNORMAL HIGH (ref <5.7)
Mean Plasma Glucose: 120 mg/dL
eAG (mmol/L): 6.6 mmol/L

## 2023-10-23 LAB — CBC WITH DIFFERENTIAL/PLATELET
Absolute Lymphocytes: 2020 {cells}/uL (ref 850–3900)
Absolute Monocytes: 388 {cells}/uL (ref 200–950)
Basophils Absolute: 51 {cells}/uL (ref 0–200)
Basophils Relative: 1 %
Eosinophils Absolute: 122 {cells}/uL (ref 15–500)
Eosinophils Relative: 2.4 %
HCT: 43.6 % (ref 35.0–45.0)
Hemoglobin: 13.5 g/dL (ref 11.7–15.5)
MCH: 23.5 pg — ABNORMAL LOW (ref 27.0–33.0)
MCHC: 31 g/dL — ABNORMAL LOW (ref 32.0–36.0)
MCV: 75.8 fL — ABNORMAL LOW (ref 80.0–100.0)
MPV: 11.2 fL (ref 7.5–12.5)
Monocytes Relative: 7.6 %
Neutro Abs: 2519 {cells}/uL (ref 1500–7800)
Neutrophils Relative %: 49.4 %
Platelets: 358 10*3/uL (ref 140–400)
RBC: 5.75 10*6/uL — ABNORMAL HIGH (ref 3.80–5.10)
RDW: 15.4 % — ABNORMAL HIGH (ref 11.0–15.0)
Total Lymphocyte: 39.6 %
WBC: 5.1 10*3/uL (ref 3.8–10.8)

## 2023-10-23 LAB — COMPLETE METABOLIC PANEL WITH GFR
AG Ratio: 1.3 (calc) (ref 1.0–2.5)
ALT: 11 U/L (ref 6–29)
AST: 12 U/L (ref 10–30)
Albumin: 4 g/dL (ref 3.6–5.1)
Alkaline phosphatase (APISO): 44 U/L (ref 31–125)
BUN: 14 mg/dL (ref 7–25)
CO2: 28 mmol/L (ref 20–32)
Calcium: 10 mg/dL (ref 8.6–10.2)
Chloride: 104 mmol/L (ref 98–110)
Creat: 0.88 mg/dL (ref 0.50–0.99)
Globulin: 3.1 g/dL (ref 1.9–3.7)
Glucose, Bld: 82 mg/dL (ref 65–99)
Potassium: 4.8 mmol/L (ref 3.5–5.3)
Sodium: 139 mmol/L (ref 135–146)
Total Bilirubin: 0.5 mg/dL (ref 0.2–1.2)
Total Protein: 7.1 g/dL (ref 6.1–8.1)
eGFR: 84 mL/min/{1.73_m2} (ref >=60)

## 2023-10-23 LAB — IRON,TIBC AND FERRITIN PANEL
%SAT: 6 % — ABNORMAL LOW (ref 16–45)
Ferritin: 16 ng/mL (ref 16–232)
Iron: 29 ug/dL — ABNORMAL LOW (ref 40–190)
TIBC: 457 ug/dL — ABNORMAL HIGH (ref 250–450)

## 2023-10-23 LAB — VITAMIN D 25 HYDROXY (VIT D DEFICIENCY, FRACTURES): Vit D, 25-Hydroxy: 28 ng/mL — ABNORMAL LOW (ref 30–100)

## 2023-10-28 ENCOUNTER — Other Ambulatory Visit: Payer: Self-pay | Admitting: Emergency Medicine

## 2023-10-28 DIAGNOSIS — J4520 Mild intermittent asthma, uncomplicated: Secondary | ICD-10-CM

## 2023-10-28 MED ORDER — FLUTICASONE-SALMETEROL 100-50 MCG/ACT IN AEPB
1.0000 | INHALATION_SPRAY | Freq: Two times a day (BID) | RESPIRATORY_TRACT | 3 refills | Status: DC
Start: 2023-10-28 — End: 2024-01-30

## 2023-11-04 ENCOUNTER — Ambulatory Visit: Payer: Self-pay

## 2023-11-04 NOTE — Telephone Encounter (Signed)
  Chief Complaint: sinus congestion Symptoms: pressure and drainage Frequency: few days Pertinent Negatives: Patient denies fever Disposition: [] ED /[] Urgent Care (no appt availability in office) / [] Appointment(In office/virtual)/ []  [facility]/ [x] Home Care/ [] Refused Recommended Disposition /[] [facility]/ []  Follow-up with PCP Additional Notes: sent care advice to MyChart Reason for Disposition  [1] Sinus congestion as part of a cold AND [2] present < 10 days  Answer Assessment - Initial Assessment Questions 1. LOCATION: "Where does it hurt?"      Congestion to the nose 3. SEVERITY: "How bad is the pain?"   (Scale 1-10; mild, moderate or severe)   - MILD (1-3): doesn't interfere with normal activities    - MODERATE (4-7): interferes with normal activities (e.g., work or school) or awakens from sleep   - SEVERE (8-10): excruciating pain and patient unable to do any normal activities        mild  7. FEVER: "Do you have a fever?" If Yes, ask: "What is it, how was it measured, and when did it start?"      no 8. OTHER SYMPTOMS: "Do you have any other symptoms?" (e.g., sore throat, cough, earache, difficulty breathing)     cough  Protocols used: Sinus Pain or Congestion-A-AH

## 2023-11-14 ENCOUNTER — Other Ambulatory Visit: Payer: Self-pay | Admitting: Nurse Practitioner

## 2023-11-14 DIAGNOSIS — D509 Iron deficiency anemia, unspecified: Secondary | ICD-10-CM

## 2023-11-14 NOTE — Telephone Encounter (Signed)
Medication Refill -  Most Recent Primary Care Visit:  Provider: Della Goo F  Department: [facility]  Visit Type: OFFICE VISIT  Date: 10/22/2023  Medication: iron 325 tablet  Has the patient contacted their pharmacy? Yes (Agent: If no, request that the patient contact the pharmacy for the refill. If patient does not wish to contact the pharmacy document the reason why and proceed with request.) (Agent: If yes, when and what did the pharmacy advise?)  Is this the correct pharmacy for this prescription? No If no, delete pharmacy and type the correct one.  This is the patient's preferred pharmacy:   [facility]  Has the prescription been filled recently? Yes  Is the patient out of the medication? No  Has the patient been seen for an appointment in the last year OR does the patient have an upcoming appointment? Yes  Can we respond through MyChart? No  Agent: Please be advised that Rx refills may take up to 3 business days. We ask that you follow-up with your pharmacy.

## 2023-11-17 MED ORDER — IRON (FERROUS SULFATE) 325 (65 FE) MG PO TABS
325.0000 mg | ORAL_TABLET | Freq: Every day | ORAL | 3 refills | Status: DC
Start: 2023-11-17 — End: 2024-03-19

## 2023-11-17 NOTE — Telephone Encounter (Signed)
Requested Prescriptions  Pending Prescriptions Disp Refills   Iron, Ferrous Sulfate, 325 (65 Fe) MG TABS 90 tablet 3    Sig: Take 325 mg by mouth daily.     Endocrinology:  Minerals - Iron Supplementation Failed - 11/14/2023  1:32 PM      Failed - RBC in normal range and within 360 days    RBC  Date Value Ref Range Status  10/22/2023 5.75 (H) 3.80 - 5.10 Million/uL Final         Failed - Fe (serum) in normal range and within 360 days    Iron  Date Value Ref Range Status  10/22/2023 29 (L) 40 - 190 mcg/dL Final   %SAT  Date Value Ref Range Status  10/22/2023 6 (L) 16 - 45 % (calc) Final         Passed - HGB in normal range and within 360 days    Hemoglobin  Date Value Ref Range Status  10/22/2023 13.5 11.7 - 15.5 g/dL Final  43/32/9518 84.1 11.1 - 15.9 g/dL Final         Passed - HCT in normal range and within 360 days    HCT  Date Value Ref Range Status  10/22/2023 43.6 35.0 - 45.0 % Final   Hematocrit  Date Value Ref Range Status  12/24/2022 45.4 34.0 - 46.6 % Final         Passed - Ferritin in normal range and within 360 days    Ferritin  Date Value Ref Range Status  10/22/2023 16 16 - 232 ng/mL Final         Passed - Valid encounter within last 12 months    Recent Outpatient Visits           3 weeks ago Mild intermittent reactive airway disease without complication   [facility] Della Goo F, FNP   7 months ago Mild intermittent reactive airway disease without complication   [facility] Berniece Salines, FNP   1 year ago Shortness of breath   [facility] Berniece Salines, FNP   1 year ago Vertigo   [facility] Della Goo F, FNP   1 year ago Moderate episode of recurrent major depressive disorder [facility])   [facility] Berniece Salines, FNP       Future Appointments             In 5 months Zane Herald, Rudolpho Sevin, FNP [facility], [facility]

## 2024-01-30 ENCOUNTER — Other Ambulatory Visit: Payer: Self-pay | Admitting: Nurse Practitioner

## 2024-01-30 DIAGNOSIS — J4520 Mild intermittent asthma, uncomplicated: Secondary | ICD-10-CM

## 2024-01-30 NOTE — Telephone Encounter (Signed)
 Requested Prescriptions  Pending Prescriptions Disp Refills   WIXELA INHUB 100-50 MCG/ACT AEPB [Pharmacy Med Name: WIXELA 100-50 INHUB] 180 each 1    Sig: INHALE 1 DOSE BY MOUTH TWICE A DAY     Pulmonology:  [employer] Passed - 01/30/2024  2:57 PM      Passed - Valid encounter within last 12 months    Recent Outpatient Visits           3 months ago Mild intermittent reactive airway disease without complication   [facility] Della Goo F, FNP   9 months ago Mild intermittent reactive airway disease without complication   [facility] Berniece Salines, FNP   1 year ago Shortness of breath   [facility] Berniece Salines, FNP   1 year ago Vertigo   [facility] Della Goo F, FNP   1 year ago Moderate episode of recurrent major depressive disorder [facility])   [facility] Berniece Salines, FNP       Future Appointments             In 2 months Zane Herald, Rudolpho Sevin, FNP [facility], [facility]

## 2024-02-08 ENCOUNTER — Telehealth: Admitting: Physician Assistant

## 2024-02-08 DIAGNOSIS — U099 Post covid-19 condition, unspecified: Secondary | ICD-10-CM

## 2024-02-08 NOTE — Progress Notes (Signed)
 Virtual Visit Consent   [name], you are scheduled for a virtual visit with a [facility] provider today. Just as with appointments in the office, your consent must be obtained to participate. Your consent will be active for this visit and any virtual visit you may have with one of our providers in the next 365 days. If you have a MyChart account, a copy of this consent can be sent to you electronically.  As this is a virtual visit, video technology does not allow for your provider to perform a traditional examination. This may limit your provider's ability to fully assess your condition. If your provider identifies any concerns that need to be evaluated in person or the need to arrange testing (such as labs, EKG, etc.), we will make arrangements to do so. Although advances in technology are sophisticated, we cannot ensure that it will always work on either your end or our end. If the connection with a video visit is poor, the visit may have to be switched to a telephone visit. With either a video or telephone visit, we are not always able to ensure that we have a secure connection.  By engaging in this virtual visit, you consent to the provision of healthcare and authorize for your insurance to be billed (if applicable) for the services provided during this visit. Depending on your insurance coverage, you may receive a charge related to this service.  I need to obtain your verbal consent now. Are you willing to proceed with your visit today? Virginia Camacho has provided verbal consent on 02/08/2024 for a virtual visit (video or telephone). Roney Jaffe, PA-C  Date: 02/08/2024 1:33 PM   Virtual Visit via Video Note   I, Virginia Camacho, connected with  Virginia Camacho  ([id number], 1980/12/18) on 02/08/24 at  1:30 PM EST by a video-enabled telemedicine application and verified that I am speaking with the correct person using two identifiers.  Location: Patient: Virtual Visit Location Patient:  Home Provider: Virtual Visit Location Provider: Home Office   I discussed the limitations of evaluation and management by telemedicine and the availability of in person appointments. The patient expressed understanding and agreed to proceed.    History of Present Illness: Virginia Camacho is a 43 y.o. who identifies as a female who was assigned female at birth, a patient with a history of long COVID and resultant asthma, reports a recent increase in breathing difficulty. She describes her breathing as labored, even with minor activities such as walking from her bedroom to the bathroom or ascending a flight of stairs. She also reports feeling winded easily and experiencing a sensation of weight on her chest, which is relieved by her albuterol rescue inhaler. She has a dry cough that is intermittent and not excessive, and any expectorant is clear. She denies any other symptoms such as body aches, fever, chills, or congestion. She is currently on Wixela for maintenance and albuterol for rescue, but feels that the [facility] is not effective. She also has a CPAP machine for sleep apnea, but has been unable to use it due to claustrophobia and a sensation of suffocation with the face mask. Problems:  Patient Active Problem List   Diagnosis Date Noted   Reactive airway disease 04/21/2023   Mild episode of recurrent major depressive disorder (HCC) 04/21/2023   Vitamin D deficiency 04/21/2023   COVID-19 long hauler 10/21/2022   Iron deficiency anemia 10/21/2022   Hyperlipidemia 06/14/2022   Anxiety 06/14/2022  Sleep apnea 05/31/2021   Class 3 severe obesity due to excess calories with serious comorbidity and body mass index (BMI) of 40.0 to 44.9 in adult  [facility]) 01/25/2019   Breast asymmetry on examination 05/08/2017   Dermatitis 05/07/2017   Acute pain of left shoulder 03/20/2016   Well woman exam with routine gynecological exam 03/07/2016   Breast mass, right 03/07/2016   Degeneration of intervertebral  disc of lumbar region 12/20/2015   Chronic pain of right knee 12/20/2015   Headache, tension type, episodic 10/06/2015   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   Headache, migraine 10/06/2015    Allergies:  Allergies  Allergen Reactions   Clarithromycin Other (See Comments) and Nausea Only    Hot   Oxycodone Hives   Medications:  Current Outpatient Medications:    acetaminophen (TYLENOL) 650 MG CR tablet, Take 650 mg by mouth every 8 (eight) hours as needed., Disp: , Rfl:    albuterol (VENTOLIN HFA) 108 (90 Base) MCG/ACT inhaler, Inhale 2 puffs into the lungs every 6 (six) hours as needed for wheezing or shortness of breath., Disp: 8 g, Rfl: 0   Iron, Ferrous Sulfate, 325 (65 Fe) MG TABS, Take 325 mg by mouth daily., Disp: 90 tablet, Rfl: 3   meclizine (ANTIVERT) 25 MG tablet, Take 1 tablet (25 mg total) by mouth 3 (three) times daily as needed for dizziness., Disp: 30 tablet, Rfl: 0   metoprolol tartrate (LOPRESSOR) 100 MG tablet, Take 1 tablet (100 mg total) by mouth once for 1 dose. TWO HOURS PRIOR TO CARDIAC CTA, Disp: 1 tablet, Rfl: 0   mometasone (ELOCON) 0.1 % cream, Apply to affected area once daily or as needed., Disp: 45 g, Rfl: 2   omeprazole (PRILOSEC) 20 MG capsule, Take 1 capsule (20 mg total) by mouth daily., Disp: 9 capsule, Rfl: 1   promethazine-dextromethorphan (PROMETHAZINE-DM) 6.25-15 MG/5ML syrup, Take 5 mLs by mouth 4 (four) times daily as needed for cough., Disp: 118 mL, Rfl: 0   WIXELA INHUB 100-50 MCG/ACT AEPB, INHALE 1 DOSE BY MOUTH TWICE A DAY, Disp: 180 each, Rfl: 1  Observations/Objective: Patient is well-developed, well-nourished in no acute distress.  Resting comfortably  at home.  Head is normocephalic, atraumatic.  No labored breathing.  Speech is clear and coherent with logical content.  Patient is alert and oriented at baseline.    Assessment and Plan: 1. COVID-19 long hauler (Primary)  Increased shortness of breath and  exertional dyspnea. Dry cough intermittently. Current maintenance therapy with Wixela and Albuterol as rescue inhaler. Patient reports feeling winded easily and has difficulty with activities such as climbing stairs. No other systemic symptoms. Patient has not been using CPAP due to claustrophobia and discomfort with the mask. -Continue using Albuterol as rescue inhaler as needed, not exceeding the recommended dose on the bottle. -Keep a log of frequency of rescue inhaler use and any nocturnal symptoms. -Contact Pulmonology for a follow-up appointment to reassess asthma control   Follow Up Instructions: I discussed the assessment and treatment plan with the patient. The patient was provided an opportunity to ask questions and all were answered. The patient agreed with the plan and demonstrated an understanding of the instructions.  A copy of instructions were sent to the patient via MyChart unless otherwise noted below.     The patient was advised to call back or seek an in-person evaluation if the symptoms worsen or if the condition fails to improve as anticipated.    Daylah Sayavong S Mayers,  PA-C

## 2024-02-08 NOTE — Patient Instructions (Signed)
  Mariella Saa, thank you for joining Roney Jaffe, PA-C for today's virtual visit.  While this provider is not your primary care provider (PCP), if your PCP is located in our provider database this encounter information will be shared with them immediately following your visit.   A [facility] MyChart account gives you access to today's visit and all your visits, tests, and labs performed at [facility] " click here if you don't have a [facility] MyChart account or go to [email or web address]  Consent: (Patient) Virginia Camacho provided verbal consent for this virtual visit at the beginning of the encounter.  Current Medications:  Current Outpatient Medications:    acetaminophen (TYLENOL) 650 MG CR tablet, Take 650 mg by mouth every 8 (eight) hours as needed., Disp: , Rfl:    albuterol (VENTOLIN HFA) 108 (90 Base) MCG/ACT inhaler, Inhale 2 puffs into the lungs every 6 (six) hours as needed for wheezing or shortness of breath., Disp: 8 g, Rfl: 0   Iron, Ferrous Sulfate, 325 (65 Fe) MG TABS, Take 325 mg by mouth daily., Disp: 90 tablet, Rfl: 3   meclizine (ANTIVERT) 25 MG tablet, Take 1 tablet (25 mg total) by mouth 3 (three) times daily as needed for dizziness., Disp: 30 tablet, Rfl: 0   metoprolol tartrate (LOPRESSOR) 100 MG tablet, Take 1 tablet (100 mg total) by mouth once for 1 dose. TWO HOURS PRIOR TO CARDIAC CTA, Disp: 1 tablet, Rfl: 0   mometasone (ELOCON) 0.1 % cream, Apply to affected area once daily or as needed., Disp: 45 g, Rfl: 2   omeprazole (PRILOSEC) 20 MG capsule, Take 1 capsule (20 mg total) by mouth daily., Disp: 9 capsule, Rfl: 1   promethazine-dextromethorphan (PROMETHAZINE-DM) 6.25-15 MG/5ML syrup, Take 5 mLs by mouth 4 (four) times daily as needed for cough., Disp: 118 mL, Rfl: 0   WIXELA INHUB 100-50 MCG/ACT AEPB, INHALE 1 DOSE BY MOUTH TWICE A DAY, Disp: 180 each, Rfl: 1   Medications ordered in this encounter:  No orders of the defined types were  placed in this encounter.    *If you need refills on other medications prior to your next appointment, please contact your pharmacy*  Follow-Up: Call back or seek an in-person evaluation if the symptoms worsen or if the condition fails to improve as anticipated.  [facility] [phone]  Other Instructions Continue using Albuterol as rescue inhaler as needed, not exceeding the recommended dose on the bottle. -Keep a log of frequency of rescue inhaler use and any nocturnal symptoms. -Contact Pulmonology for a follow-up appointment to reassess asthma control    If you have been instructed to have an in-person evaluation today at a local Urgent Care facility, please use the link below. It will take you to a list of all of our available [facility], including address, phone number and hours of operation. Please do not delay care.  [facility]  If you or a family member do not have a primary care provider, use the link below to schedule a visit and establish care. When you choose a [facility] primary care physician or advanced practice provider, you gain a long-term partner in health. Find a Primary Care Provider  Learn more about [facility]'s in-office and virtual care options: [facility] - Get Care Now

## 2024-02-09 ENCOUNTER — Telehealth: Payer: Self-pay | Admitting: Student in an Organized Health Care Education/Training Program

## 2024-02-09 ENCOUNTER — Ambulatory Visit
Admission: RE | Admit: 2024-02-09 | Discharge: 2024-02-09 | Disposition: A | Discharge status: Home / Self Care | Source: Ambulatory Visit | Attending: Student in an Organized Health Care Education/Training Program | Admitting: Student in an Organized Health Care Education/Training Program

## 2024-02-09 ENCOUNTER — Other Ambulatory Visit
Admission: RE | Admit: 2024-02-09 | Discharge: 2024-02-09 | Disposition: A | Discharge status: Home / Self Care | Source: Ambulatory Visit | Attending: *Deleted | Admitting: *Deleted

## 2024-02-09 ENCOUNTER — Ambulatory Visit: Payer: Self-pay | Admitting: Nurse Practitioner

## 2024-02-09 DIAGNOSIS — R0602 Shortness of breath: Secondary | ICD-10-CM | POA: Insufficient documentation

## 2024-02-09 DIAGNOSIS — J4520 Mild intermittent asthma, uncomplicated: Secondary | ICD-10-CM | POA: Diagnosis present

## 2024-02-09 MED ORDER — AZITHROMYCIN 250 MG PO TABS
ORAL_TABLET | ORAL | 0 refills | Status: AC
Start: 2024-02-09 — End: 2024-02-14

## 2024-02-09 MED ORDER — PREDNISONE 20 MG PO TABS
40.0000 mg | ORAL_TABLET | Freq: Every day | ORAL | 0 refills | Status: AC
Start: 2024-02-09 — End: 2024-02-14

## 2024-02-09 NOTE — Telephone Encounter (Signed)
 I have notified the patient. Nothing further needed.

## 2024-02-09 NOTE — Telephone Encounter (Signed)
 Patient called stating her asthma is flaring back up . Patient is using her rescue inhaler more and more. Patient was advised to go to the ED by her primary and would like to talk to our office before going. Did schedule overdue follow up with Dr.Dgayli 3/17. Please advise.

## 2024-02-09 NOTE — Telephone Encounter (Signed)
 How long have your symptoms been going on for? Started Friday night.  Any fevers, chills or sweats? No  Any cough? If so are you getting anything up? What color? Dry cough Any SOB? Increased SOB and restricted breathing.  She said she is SOB when walking to the bathroom, which is increased from her normal. Also, last night when she was laying on her side she was SOB and had to use her inhaler. This is new as well. Any wheezing? Slight wheezing Does not monitor her O2 at home.   Wixela- BID Albuterol- Bid   [facility].

## 2024-02-09 NOTE — Telephone Encounter (Signed)
 Copied from CRM [id number]. Topic: Clinical - Red Word Triage >> Feb 09, 2024  9:40 AM Virginia Camacho wrote: Red Word that prompted transfer to Nurse Triage: Difficulty breathing.   The patient has a history of asthma.  She is having difficulty breathing and using rescue inhaler 2-3 times a day..  Patient feels like she needs some additional supportive medication.  Patient feels like she is getting worse.  Chief Complaint: breathing difficulty Symptoms: SOB w/exertion, labored & diaphragmatic breathing, not able to lay flat Frequency:  Friday or Saturday Pertinent Negatives: Patient denies fever, body aches Disposition: [x] ED /[] Urgent Care (no appt availability in office) / [] Appointment(In office/virtual)/ []  [facility]/ [] Home Care/ [] Refused Recommended Disposition /[] [facility]/ []  Follow-up with PCP Additional Notes: pt c/o labored breathing, diaphragmatic breathing, unable to lay back. States at night having to sleep sitting up in order to breath.  Pt walking short distances begins labored breathing.  Recommended pt go to ER now.  Reason for Disposition  [1] MODERATE difficulty breathing (e.g., speaks in phrases, SOB even at rest, pulse 100-120) AND [2] NEW-onset or WORSE than normal  Answer Assessment - Initial Assessment Questions 1. RESPIRATORY STATUS: "Describe your breathing?" (e.g., wheezing, shortness of breath, unable to speak, severe coughing)      SOB -  2. ONSET: "When did this breathing problem begin?"      Friday or Saturday 3. PATTERN "Does the difficult breathing come and go, or has it been constant since it started?"      Comes and goes 4. SEVERITY: "How bad is your breathing?" (e.g., mild, moderate, severe)    - MILD: No SOB at rest, mild SOB with walking, speaks normally in sentences, can lie down, no retractions, pulse < 100.    - MODERATE: SOB at rest, SOB with minimal exertion and prefers to sit, cannot lie down flat, speaks in phrases, mild  retractions, audible wheezing, pulse 100-120.    - SEVERE: Very SOB at rest, speaks in single words, struggling to breathe, sitting hunched forward, retractions, pulse > 120      moderate 5. RECURRENT SYMPTOM: "Have you had difficulty breathing before?" If Yes, ask: "When was the last time?" and "What happened that time?"      Yes - with asthma flareup 6. CARDIAC HISTORY: "Do you have any history of heart disease?" (e.g., heart attack, angina, bypass surgery, angioplasty)      no 7. LUNG HISTORY: "Do you have any history of lung disease?"  (e.g., pulmonary embolus, asthma, emphysema)     no 8. CAUSE: "What do you think is causing the breathing problem?"      unknown 9. OTHER SYMPTOMS: "Do you have any other symptoms? (e.g., dizziness, runny nose, cough, chest pain, fever)     Dry cough, chest restricted when breathing 10. O2 SATURATION MONITOR:  "Do you use an oxygen saturation monitor (pulse oximeter) at home?" If Yes, ask: "What is your reading (oxygen level) today?" "What is your usual oxygen saturation reading?" (e.g., 95%)       N/a 11. PREGNANCY: "Is there any chance you are pregnant?" "When was your last menstrual period?"       N/a 12. TRAVEL: "Have you traveled out of the country in the last month?" (e.g., travel history, exposures)       N/a  Protocols used: Breathing Difficulty-A-AH

## 2024-02-11 ENCOUNTER — Other Ambulatory Visit: Payer: Self-pay | Admitting: Nurse Practitioner

## 2024-02-11 DIAGNOSIS — J4520 Mild intermittent asthma, uncomplicated: Secondary | ICD-10-CM

## 2024-02-11 DIAGNOSIS — R051 Acute cough: Secondary | ICD-10-CM

## 2024-02-12 MED ORDER — ALBUTEROL SULFATE HFA 108 (90 BASE) MCG/ACT IN AERS
2.0000 | INHALATION_SPRAY | Freq: Four times a day (QID) | RESPIRATORY_TRACT | 0 refills | Status: DC | PRN
Start: 2024-02-12 — End: 2024-03-12

## 2024-02-23 ENCOUNTER — Ambulatory Visit: Admitting: Student in an Organized Health Care Education/Training Program

## 2024-02-23 ENCOUNTER — Ambulatory Visit
Admission: RE | Admit: 2024-02-23 | Discharge: 2024-02-23 | Disposition: A | Discharge status: Home / Self Care | Source: Ambulatory Visit | Attending: Student in an Organized Health Care Education/Training Program | Admitting: Student in an Organized Health Care Education/Training Program

## 2024-02-23 ENCOUNTER — Encounter: Payer: Self-pay | Admitting: Student in an Organized Health Care Education/Training Program

## 2024-02-23 VITALS — BP 132/102 | HR 104 | Temp 97.6°F | Ht 63.0 in | Wt 236.6 lb

## 2024-02-23 DIAGNOSIS — J4520 Mild intermittent asthma, uncomplicated: Secondary | ICD-10-CM

## 2024-02-23 DIAGNOSIS — R0602 Shortness of breath: Secondary | ICD-10-CM

## 2024-02-23 NOTE — Progress Notes (Signed)
 Assessment & Plan:   1. Shortness of breath (Primary) 2. Mild intermittent reactive airway disease without complication  Pleasant 43 year old female presenting for follow up, with increased burden of symptoms. Patient previously noted to have a bronchodilator response to albuterol on PFT's and managed for asthma with Earlie Server, though continues to experience symptoms. Recently prescribed a course of antibiotics and steroids with mild improvement in symptoms. A CXR ordered was notable for increased interstitial markings on my review. Her PFT's did show a moderate drop in DLCO which in the setting of an abnormal CXR could suggest a fibrotic process (ILD vs fibrosis vs penumonitis). Will obtain a high resolution chest CT to further evaluate this.  - CT CHEST HIGH RESOLUTION; Future - Continue Breo Ellipta  Return in about 1 week (around 03/01/2024).  I spent 30 minutes caring for this patient today, including preparing to see the patient, obtaining a medical history , reviewing a separately obtained history, performing a medically appropriate examination and/or evaluation, counseling and educating the patient/family/caregiver, ordering medications, tests, or procedures, and documenting clinical information in the electronic health record  Raechel Chute, MD [facility]  End of visit medications:  No orders of the defined types were placed in this encounter.    Current Outpatient Medications:    acetaminophen (TYLENOL) 650 MG CR tablet, Take 650 mg by mouth every 8 (eight) hours as needed., Disp: , Rfl:    albuterol (VENTOLIN HFA) 108 (90 Base) MCG/ACT inhaler, Inhale 2 puffs into the lungs every 6 (six) hours as needed for wheezing or shortness of breath., Disp: 8 g, Rfl: 0   Iron, Ferrous Sulfate, 325 (65 Fe) MG TABS, Take 325 mg by mouth daily., Disp: 90 tablet, Rfl: 3   meclizine (ANTIVERT) 25 MG tablet, Take 1 tablet (25 mg total) by mouth 3 (three) times daily as  needed for dizziness., Disp: 30 tablet, Rfl: 0   mometasone (ELOCON) 0.1 % cream, Apply to affected area once daily or as needed., Disp: 45 g, Rfl: 2   promethazine-dextromethorphan (PROMETHAZINE-DM) 6.25-15 MG/5ML syrup, Take 5 mLs by mouth 4 (four) times daily as needed for cough., Disp: 118 mL, Rfl: 0   WIXELA INHUB 100-50 MCG/ACT AEPB, INHALE 1 DOSE BY MOUTH TWICE A DAY, Disp: 180 each, Rfl: 1   metoprolol tartrate (LOPRESSOR) 100 MG tablet, Take 1 tablet (100 mg total) by mouth once for 1 dose. TWO HOURS PRIOR TO CARDIAC CTA, Disp: 1 tablet, Rfl: 0   omeprazole (PRILOSEC) 20 MG capsule, Take 1 capsule (20 mg total) by mouth daily., Disp: 9 capsule, Rfl: 1   Subjective:   PATIENT ID: Virginia Camacho GENDER: female DOB: 29-Jul-1981, MRN: [id number]  Chief Complaint  Patient presents with   Follow-up    Occasional cough. Shortness of breath on exertion.     HPI   Ms. Yoshimura is a pleasant 43 year old female presenting to clinic for follow up.  She presents for follow up in regards to her shortness of breath. She did develop a few episodes with increased shortness of breath and cough, with symptoms of an URTI. Prednisone and antibiotics were ordered, and while she did feel better with them, she didn't return back to normal. She continues to experience exertional dyspnea which is limiting for her. She also felt short of breath after a recent flight. She is compliant with her inhaler use.  She denies any chest pain, chest tightness, wheezing, cough, sputum production or hemoptysis.  She reports no  personal history of lung disease. She grew up in [place] and was exposed to extreme cold as a young adult which did not cause her to be short of breath.  She was never treated for any bronchitis or similar presentations.  She was very active in her youth and played a lot of basketball without limitation.  She tells me that she gained a significant amount of weight which she has struggled to lose. Over  the course of this, her shortness of breath was more noticeable.   The patient has a history of OSA for which she uses a CPAP and is generally compliant with it.  She has a history of smoking, quit 2 years ago (around 10 pack years of smoking history). She does not have any other inhalational exposures.  She does not have any pets but used to have a dog. She does notice some environmental allergies but has not had to be treated for them.  She lives in an apartment but does not have any mold on her house is mostly hardwood. No new exposures identified since our last visit. She works as a Marketing executive. Family history is notable for severe asthma in her mother and sister.  Ancillary information including prior medications, full medical/surgical/family/social histories, and PFTs (when available) are listed below and have been reviewed.   Review of Systems  Constitutional:  Negative for chills, diaphoresis, fever, malaise/fatigue and weight loss.  Respiratory:  Positive for shortness of breath. Negative for cough, hemoptysis, sputum production and wheezing.   Cardiovascular:  Negative for chest pain.  Skin:  Negative for rash.     Objective:   Vitals:   02/23/24 0834  BP: (!) 132/102  Pulse: (!) 104  Temp: 97.6 F (36.4 C)  TempSrc: Temporal  SpO2: 93%  Weight: 236 lb 9.6 oz (107.3 kg)  Height: 5\' 3"  (1.6 m)   93% on RA BMI Readings from Last 3 Encounters:  02/23/24 41.91 kg/m  10/22/23 43.17 kg/m  06/18/23 43.43 kg/m   Wt Readings from Last 3 Encounters:  02/23/24 236 lb 9.6 oz (107.3 kg)  10/22/23 243 lb 11.2 oz (110.5 kg)  06/18/23 253 lb (114.8 kg)    Physical Exam Constitutional:      General: She is not in acute distress.    Appearance: Normal appearance. She is obese. She is not ill-appearing.  HENT:     Head: Normocephalic.     Mouth/Throat:     Mouth: Mucous membranes are moist.  Eyes:     Extraocular Movements: Extraocular movements intact.      Pupils: Pupils are equal, round, and reactive to light.  Cardiovascular:     Rate and Rhythm: Normal rate.     Pulses: Normal pulses.     Heart sounds: Normal heart sounds.  Pulmonary:     Effort: Pulmonary effort is normal. No respiratory distress.     Breath sounds: Rales present. No wheezing or rhonchi.  Abdominal:     Palpations: Abdomen is soft.  Musculoskeletal:     Cervical back: Normal range of motion.  Skin:    General: Skin is warm.  Neurological:     General: No focal deficit present.     Mental Status: She is alert and oriented to person, place, and time. Mental status is at baseline.       Ancillary Information    Past Medical History:  Diagnosis Date   Allergy    Back pain    Morbid obesity with BMI  of 40.0-44.9, adult (HCC) 03/20/2016     Family History  Problem Relation Age of Onset   Heart disease Mother    Diabetes Mother    Breast cancer Maternal Grandmother    Heart disease Maternal Grandfather      Past Surgical History:  Procedure Laterality Date   KNEE SURGERY Right     Social History   Socioeconomic History   Marital status: Single    Spouse name: Not on file   Number of children: Not on file   Years of education: Not on file   Highest education level: Not on file  Occupational History   Not on file  Tobacco Use   Smoking status: Former    Current packs/day: 0.00    Average packs/day: 0.3 packs/day for 20.0 years (5.0 ttl pk-yrs)    Types: Cigarettes    Start date: 2002    Quit date: 2022    Years since quitting: 3.2   Smokeless tobacco: Never  Vaping Use   Vaping status: Never Used  Substance and Sexual Activity   Alcohol use: Yes    Alcohol/week: 0.0 standard drinks of alcohol    Comment: rarely   Drug use: Yes    Frequency: 1.0 times per week    Types: Marijuana    Comment: occasional    Sexual activity: Yes    Partners: Female, Female    Birth control/protection: Condom  Other Topics Concern   Not on file  Social  History Narrative   Not on file   Social Drivers of Health   Financial Resource Strain: Low Risk  (05/31/2021)   Overall Financial Resource Strain (CARDIA)    Difficulty of Paying Living Expenses: Not hard at all  Food Insecurity: No Food Insecurity (05/31/2021)   Hunger Vital Sign    Worried About Running Out of Food in the Last Year: Never true    Ran Out of Food in the Last Year: Never true  Transportation Needs: No Transportation Needs (05/31/2021)   PRAPARE - Administrator, Civil Service (Medical): No    Lack of Transportation (Non-Medical): No  Physical Activity: Inactive (05/31/2021)   Exercise Vital Sign    Days of Exercise per Week: 0 days    Minutes of Exercise per Session: 0 min  Stress: No Stress Concern Present (05/31/2021)   [employer] of Occupational Health - Occupational Stress Questionnaire    Feeling of Stress : Only a little  Social Connections: Socially Isolated (05/31/2021)   Social Connection and Isolation Panel [NHANES]    Frequency of Communication with Friends and Family: More than three times a week    Frequency of Social Gatherings with Friends and Family: Once a week    Attends Religious Services: Never    Database administrator or Organizations: No    Attends Banker Meetings: Never    Marital Status: Never married  Intimate Partner Violence: Not At Risk (05/31/2021)   Humiliation, Afraid, Rape, and Kick questionnaire    Fear of Current or Ex-Partner: No    Emotionally Abused: No    Physically Abused: No    Sexually Abused: No     Allergies  Allergen Reactions   Clarithromycin Other (See Comments) and Nausea Only    Hot   Oxycodone Hives     CBC    Component Value Date/Time   WBC 5.1 10/22/2023 1543   RBC 5.75 (H) 10/22/2023 1543   HGB 13.5 10/22/2023 1543   HGB  14.0 12/24/2022 1130   HCT 43.6 10/22/2023 1543   HCT 45.4 12/24/2022 1130   PLT 358 10/22/2023 1543   PLT 356 12/24/2022 1130   MCV 75.8 (L)  10/22/2023 1543   MCV 72 (L) 12/24/2022 1130   MCH 23.5 (L) 10/22/2023 1543   MCHC 31.0 (L) 10/22/2023 1543   RDW 15.4 (H) 10/22/2023 1543   RDW 21.4 (H) 12/24/2022 1130   LYMPHSABS 1,805 04/21/2023 1006   LYMPHSABS 2.2 12/24/2022 1130   MONOABS 420 05/07/2017 0819   EOSABS 122 10/22/2023 1543   EOSABS 0.1 12/24/2022 1130   BASOSABS 51 10/22/2023 1543   BASOSABS 0.1 12/24/2022 1130    Pulmonary Functions Testing Results:    Latest Ref Rng & Units 01/14/2023    4:34 PM  PFT Results  FVC-Pre L 2.83   FVC-Predicted Pre % 77   FVC-Post L 3.12   FVC-Predicted Post % 85   Pre FEV1/FVC % % 63   Post FEV1/FCV % % 68   FEV1-Pre L 1.78   FEV1-Predicted Pre % 59   FEV1-Post L 2.12   DLCO uncorrected ml/min/mmHg 10.07   DLCO UNC% % 46   DLVA Predicted % 51   TLC L 5.53   TLC % Predicted % 109   RV % Predicted % 149     Outpatient Medications Prior to Visit  Medication Sig Dispense Refill   acetaminophen (TYLENOL) 650 MG CR tablet Take 650 mg by mouth every 8 (eight) hours as needed.     albuterol (VENTOLIN HFA) 108 (90 Base) MCG/ACT inhaler Inhale 2 puffs into the lungs every 6 (six) hours as needed for wheezing or shortness of breath. 8 g 0   Iron, Ferrous Sulfate, 325 (65 Fe) MG TABS Take 325 mg by mouth daily. 90 tablet 3   meclizine (ANTIVERT) 25 MG tablet Take 1 tablet (25 mg total) by mouth 3 (three) times daily as needed for dizziness. 30 tablet 0   mometasone (ELOCON) 0.1 % cream Apply to affected area once daily or as needed. 45 g 2   promethazine-dextromethorphan (PROMETHAZINE-DM) 6.25-15 MG/5ML syrup Take 5 mLs by mouth 4 (four) times daily as needed for cough. 118 mL 0   WIXELA INHUB 100-50 MCG/ACT AEPB INHALE 1 DOSE BY MOUTH TWICE A DAY 180 each 1   metoprolol tartrate (LOPRESSOR) 100 MG tablet Take 1 tablet (100 mg total) by mouth once for 1 dose. TWO HOURS PRIOR TO CARDIAC CTA 1 tablet 0   omeprazole (PRILOSEC) 20 MG capsule Take 1 capsule (20 mg total) by mouth daily.  9 capsule 1   No facility-administered medications prior to visit.

## 2024-02-25 ENCOUNTER — Telehealth (INDEPENDENT_AMBULATORY_CARE_PROVIDER_SITE_OTHER): Admitting: Student in an Organized Health Care Education/Training Program

## 2024-02-25 ENCOUNTER — Encounter: Payer: Self-pay | Admitting: Student in an Organized Health Care Education/Training Program

## 2024-02-25 DIAGNOSIS — J432 Centrilobular emphysema: Secondary | ICD-10-CM

## 2024-02-25 MED ORDER — SPIRIVA RESPIMAT 2.5 MCG/ACT IN AERS
2.0000 | INHALATION_SPRAY | Freq: Every day | RESPIRATORY_TRACT | 12 refills | Status: DC
Start: 2024-02-25 — End: 2024-08-23

## 2024-02-25 NOTE — Progress Notes (Signed)
 Assessment & Plan:   #Centrilobular emphysema #Moderate Persistent Asthma  Presents for follow up of shortness of breath with previous PFT's showing an obstructive defect with a significant bronchodilator response. There is also a drop in DLCO. She did have an exacerbation of symptoms earlier in March prompting treatment with antibiotics and steroids, and a CXR was obtained showing increased bronchial markings. Given this, a HRCT was obtained that I have personally reviewed (official report not yet available). The CT is showing extensive pulmonary emphysema that is incongruent with the degree of smoking she's reported. The differential for extensive emphysema also includes cystic lung disease.  Will obtain targeted workup for cystic lung disease (immunoglobulins, ANA, TSC1/TSC2/FLCN gene analysis, VEGF-D levels) as well as alpha-1 anti-trypsin levels to assess for deficiency. Will add LAMA therapy with spiriva respimat to her regimen, and consider upping the dose on ICS on further follow after repeat PFT's.  - Immunoglobulins, QN, A/E/G/M - ANA 12 Plus Profile (RDL) - Other/Misc lab test; Future > TSC1/TSC2 - Other/Misc lab test; Future > FLCN gene - Other/Misc lab test; Future > VEGF-D - Alpha-1-antitrypsin - Tiotropium Bromide Monohydrate (SPIRIVA RESPIMAT) 2.5 MCG/ACT AERS; Inhale 2 puffs into the lungs daily.  Dispense: 60 each; Refill: 12 - Continue Wixela one puff bid   Return in about 3 months (around 05/27/2024).  I spent 30 minutes caring for this patient today, including preparing to see the patient, obtaining a medical history , reviewing a separately obtained history, counseling and educating the patient/family/caregiver, ordering medications, tests, or procedures, documenting clinical information in the electronic health record, and independently interpreting results (not separately reported/billed) and communicating results to the patient/family/caregiver  Raechel Chute,  MD [facility]  End of visit medications:  Meds ordered this encounter  Medications   Tiotropium Bromide Monohydrate (SPIRIVA RESPIMAT) 2.5 MCG/ACT AERS    Sig: Inhale 2 puffs into the lungs daily.    Dispense:  60 each    Refill:  12     Current Outpatient Medications:    acetaminophen (TYLENOL) 650 MG CR tablet, Take 650 mg by mouth every 8 (eight) hours as needed., Disp: , Rfl:    albuterol (VENTOLIN HFA) 108 (90 Base) MCG/ACT inhaler, Inhale 2 puffs into the lungs every 6 (six) hours as needed for wheezing or shortness of breath., Disp: 8 g, Rfl: 0   Iron, Ferrous Sulfate, 325 (65 Fe) MG TABS, Take 325 mg by mouth daily., Disp: 90 tablet, Rfl: 3   meclizine (ANTIVERT) 25 MG tablet, Take 1 tablet (25 mg total) by mouth 3 (three) times daily as needed for dizziness., Disp: 30 tablet, Rfl: 0   mometasone (ELOCON) 0.1 % cream, Apply to affected area once daily or as needed., Disp: 45 g, Rfl: 2   omeprazole (PRILOSEC) 20 MG capsule, Take 1 capsule (20 mg total) by mouth daily., Disp: 9 capsule, Rfl: 1   promethazine-dextromethorphan (PROMETHAZINE-DM) 6.25-15 MG/5ML syrup, Take 5 mLs by mouth 4 (four) times daily as needed for cough., Disp: 118 mL, Rfl: 0   Tiotropium Bromide Monohydrate (SPIRIVA RESPIMAT) 2.5 MCG/ACT AERS, Inhale 2 puffs into the lungs daily., Disp: 60 each, Rfl: 12   WIXELA INHUB 100-50 MCG/ACT AEPB, INHALE 1 DOSE BY MOUTH TWICE A DAY, Disp: 180 each, Rfl: 1   metoprolol tartrate (LOPRESSOR) 100 MG tablet, Take 1 tablet (100 mg total) by mouth once for 1 dose. TWO HOURS PRIOR TO CARDIAC CTA, Disp: 1 tablet, Rfl: 0   Subjective:   PATIENT ID:  Virginia Camacho GENDER: female DOB: [birth year], MRN: [id number]  Chief Complaint  Patient presents with   Follow-up    HPI  I connected with  Virginia Camacho on 02/25/24 by a video enabled telemedicine application and verified that I am speaking with the correct person using two identifiers. I discussed  the limitations of evaluation and management by telemedicine. The patient expressed understanding and agreed to proceed. I connected from my office while Ms. Sailer connected from her car through a mobile smart device.  She presents for follow up in regards to her shortness of breath. I last saw Ms. Bratton a week ago after she'd reported symptoms of increased shortness of breath and cough. She'd received a course of prednisone and antibiotics with improvement in symptoms. CXR ordered showed increased bronchial markings.  Today, she feels that her breathing continues to improve. She is able to do more and is less short of breath, though not quite back to baseline. She is compliant with her inhalers [place]).   She reports no personal history of lung disease. She grew up in [place] and was exposed to extreme cold as a young adult which did not cause her to be short of breath.  She was never treated for any bronchitis or similar presentations.  She was very active in her youth and played a lot of basketball without limitation.  She is actively working on weight loss and has been successful in losing weight. She has a history of OSA for which she uses a CPAP and is generally compliant with it.    She has a history of smoking, quit 2 years ago (around 10 pack years of smoking history). She also reports previous heavy marijuana use (around 4-5 rolled cigarettes a day) for a few years, but hasn't done so in a long time. There are no current inhalational exposures reported.   She does not have any pets but used to have a dog. She does notice some environmental allergies but has not had to be treated for them.  She lives in an apartment but does not have any mold on her house is mostly hardwood. 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 Family history is notable for severe asthma in her mother and sister.    Ancillary information including  prior medications, full medical/surgical/family/social histories, and PFTs (when available) are listed below and have been reviewed.   Review of Systems  Constitutional:  Negative for chills, diaphoresis, fever, malaise/fatigue and weight loss.  Respiratory:  Positive for shortness of breath. Negative for cough, hemoptysis, sputum production and wheezing.   Cardiovascular:  Negative for chest pain.  Skin:  Negative for rash.     Objective:   BMI Readings from Last 3 Encounters:  02/23/24 41.91 kg/m  10/22/23 43.17 kg/m  06/18/23 43.43 kg/m   Wt Readings from Last 3 Encounters:  02/23/24 236 lb 9.6 oz (107.3 kg)  10/22/23 243 lb 11.2 oz (110.5 kg)  06/18/23 253 lb (114.8 kg)     Ancillary Information    Past Medical History:  Diagnosis Date   Allergy    Back pain    Morbid obesity with BMI of 40.0-44.9, adult (HCC) 03/20/2016     Family History  Problem Relation Age of Onset   Heart disease Mother    Diabetes Mother    Breast cancer Maternal Grandmother    Heart disease Maternal Grandfather  Past Surgical History:  Procedure Laterality Date   KNEE SURGERY Right     Social History   Socioeconomic History   Marital status: Single    Spouse name: Not on file   Number of children: Not on file   Years of education: Not on file   Highest education level: Not on file  Occupational History   Not on file  Tobacco Use   Smoking status: Former    Current packs/day: 0.00    Average packs/day: 0.3 packs/day for 20.0 years (5.0 ttl pk-yrs)    Types: Cigarettes    Start date: 2002    Quit date: 2022    Years since quitting: 3.2   Smokeless tobacco: Never  Vaping Use   Vaping status: Never Used  Substance and Sexual Activity   Alcohol use: Yes    Alcohol/week: 0.0 standard drinks of alcohol    Comment: rarely   Drug use: Yes    Frequency: 1.0 times per week    Types: Marijuana    Comment: occasional    Sexual activity: Yes    Partners: Female, Female     Birth control/protection: Condom  Other Topics Concern   Not on file  Social History Narrative   Not on file   Social Drivers of Health   Financial Resource Strain: Low Risk  (05/31/2021)   Overall Financial Resource Strain (CARDIA)    Difficulty of Paying Living Expenses: Not hard at all  Food Insecurity: No Food Insecurity (05/31/2021)   Hunger Vital Sign    Worried About Running Out of Food in the Last Year: Never true    Ran Out of Food in the Last Year: Never true  Transportation Needs: No Transportation Needs (05/31/2021)   PRAPARE - Administrator, Civil Service (Medical): No    Lack of Transportation (Non-Medical): No  Physical Activity: Inactive (05/31/2021)   Exercise Vital Sign    Days of Exercise per Week: 0 days    Minutes of Exercise per Session: 0 min  Stress: No Stress Concern Present (05/31/2021)   [employer] of Occupational Health - Occupational Stress Questionnaire    Feeling of Stress : Only a little  Social Connections: Socially Isolated (05/31/2021)   Social Connection and Isolation Panel [NHANES]    Frequency of Communication with Friends and Family: More than three times a week    Frequency of Social Gatherings with Friends and Family: Once a week    Attends Religious Services: Never    Database administrator or Organizations: No    Attends Banker Meetings: Never    Marital Status: Never married  Intimate Partner Violence: Not At Risk (05/31/2021)   Humiliation, Afraid, Rape, and Kick questionnaire    Fear of Current or Ex-Partner: No    Emotionally Abused: No    Physically Abused: No    Sexually Abused: No     Allergies  Allergen Reactions   Clarithromycin Other (See Comments) and Nausea Only    Hot   Oxycodone Hives     CBC    Component Value Date/Time   WBC 5.1 10/22/2023 1543   RBC 5.75 (H) 10/22/2023 1543   HGB 13.5 10/22/2023 1543   HGB 14.0 12/24/2022 1130   HCT 43.6 10/22/2023 1543   HCT 45.4 12/24/2022  1130   PLT 358 10/22/2023 1543   PLT 356 12/24/2022 1130   MCV 75.8 (L) 10/22/2023 1543   MCV 72 (L) 12/24/2022 1130   MCH 23.5 (  L) 10/22/2023 1543   MCHC 31.0 (L) 10/22/2023 1543   RDW 15.4 (H) 10/22/2023 1543   RDW 21.4 (H) 12/24/2022 1130   LYMPHSABS 1,805 04/21/2023 1006   LYMPHSABS 2.2 12/24/2022 1130   MONOABS 420 05/07/2017 0819   EOSABS 122 10/22/2023 1543   EOSABS 0.1 12/24/2022 1130   BASOSABS 51 10/22/2023 1543   BASOSABS 0.1 12/24/2022 1130    Pulmonary Functions Testing Results:    Latest Ref Rng & Units 01/14/2023    4:34 PM  PFT Results  FVC-Pre L 2.83   FVC-Predicted Pre % 77   FVC-Post L 3.12   FVC-Predicted Post % 85   Pre FEV1/FVC % % 63   Post FEV1/FCV % % 68   FEV1-Pre L 1.78   FEV1-Predicted Pre % 59   FEV1-Post L 2.12   DLCO uncorrected ml/min/mmHg 10.07   DLCO UNC% % 46   DLVA Predicted % 51   TLC L 5.53   TLC % Predicted % 109   RV % Predicted % 149     Outpatient Medications Prior to Visit  Medication Sig Dispense Refill   acetaminophen (TYLENOL) 650 MG CR tablet Take 650 mg by mouth every 8 (eight) hours as needed.     albuterol (VENTOLIN HFA) 108 (90 Base) MCG/ACT inhaler Inhale 2 puffs into the lungs every 6 (six) hours as needed for wheezing or shortness of breath. 8 g 0   Iron, Ferrous Sulfate, 325 (65 Fe) MG TABS Take 325 mg by mouth daily. 90 tablet 3   meclizine (ANTIVERT) 25 MG tablet Take 1 tablet (25 mg total) by mouth 3 (three) times daily as needed for dizziness. 30 tablet 0   mometasone (ELOCON) 0.1 % cream Apply to affected area once daily or as needed. 45 g 2   omeprazole (PRILOSEC) 20 MG capsule Take 1 capsule (20 mg total) by mouth daily. 9 capsule 1   promethazine-dextromethorphan (PROMETHAZINE-DM) 6.25-15 MG/5ML syrup Take 5 mLs by mouth 4 (four) times daily as needed for cough. 118 mL 0   WIXELA INHUB 100-50 MCG/ACT AEPB INHALE 1 DOSE BY MOUTH TWICE A DAY 180 each 1   metoprolol tartrate (LOPRESSOR) 100 MG tablet Take 1  tablet (100 mg total) by mouth once for 1 dose. TWO HOURS PRIOR TO CARDIAC CTA 1 tablet 0   No facility-administered medications prior to visit.

## 2024-03-04 ENCOUNTER — Telehealth: Payer: Self-pay | Admitting: Student in an Organized Health Care Education/Training Program

## 2024-03-04 ENCOUNTER — Ambulatory Visit: Payer: Self-pay

## 2024-03-04 ENCOUNTER — Encounter: Payer: Self-pay | Admitting: Student in an Organized Health Care Education/Training Program

## 2024-03-04 NOTE — Telephone Encounter (Signed)
 I have notified the patient. Nothing further needed.

## 2024-03-04 NOTE — Telephone Encounter (Signed)
 Copied from CRM [id number]. Topic: General - Other >> Mar 04, 2024 11:21 AM [name] wrote: Reason for CRM: Patient would like to inquire if they would qualify for a temporary handicap parking pass/sticker based on their new diagnosis - sometimes, she has to park very far away from her appointment building - Please call patient to notify her if this is possible.

## 2024-03-04 NOTE — Telephone Encounter (Signed)
 Copied from CRM [id number]. Topic: Clinical - Medication Question >> Mar 04, 2024 11:24 AM [name] wrote: Reason for CRM: Patient would like to know if she could get some kind of cough syrup called in - she was recently seen by Dr. Aundria Rud on 03/19 and prescribed an inhaler, however, she's been having this dry cough for a few weeks (which he is aware of) and last night, she was having a coughing fit for about 10-15 minutes. She typically uses the pharmacy below:  [id number] - [place], [place] - [address] Phone: [phone] Fax: [phone]  Please call patient if you need any additional information or have any questions.  E2C2 Pulmonary Triage - Initial Assessment Questions "Chief Complaint (e.g., cough, sob, wheezing, fever, chills, sweat or additional symptoms) *Go to specific symptom protocol after initial questions. Dry Cough  "How long have symptoms been present?" A couple of weeks  Have you tested for COVID or Flu? Note: If not, ask patient if a home test can be taken. If so, instruct patient to call back for positive results. No  MEDICINES:   "Have you used any OTC meds to help with symptoms?" Yes If yes, ask "What medications?" Robitussin  "Have you used your inhalers/maintenance medication?" No If yes, "What medications?" No  If inhaler, ask "How many puffs and how often?" Note: Review instructions on medication in the chart. No  The patient called in regarding a request for prescription cough medication for  a dry cough that has been ongoing for a couple of weeks. The patient denies dyspnea worse than baseline, and denies the cough being productive. Denies a fever, and denies use of her rescue inhaler.   Reason for Disposition  Cough has been present for > 3 weeks    Patient was recently seen in office on the 3-17 and had a virtual visit on 3-19. Recently diagnosed with COPD.  Answer Assessment - Initial Assessment Questions 1. ONSET: "When did the cough begin?"        For a couple of weeks  2. SEVERITY: "How bad is the cough today?"      Intermittent  3. SPUTUM: "Describe the color of your sputum" (none, dry cough; clear, white, yellow, green)     None  4. HEMOPTYSIS: "Are you coughing up any blood?" If so ask: "How much?" (flecks, streaks, tablespoons, etc.)     No  5. DIFFICULTY BREATHING: "Are you having difficulty breathing?" If Yes, ask: "How bad is it?" (e.g., mild, moderate, severe)    - MILD: No SOB at rest, mild SOB with walking, speaks normally in sentences, can lie down, no retractions, pulse < 100.    - MODERATE: SOB at rest, SOB with minimal exertion and prefers to sit, cannot lie down flat, speaks in phrases, mild retractions, audible wheezing, pulse 100-120.    - SEVERE: Very SOB at rest, speaks in single words, struggling to breathe, sitting hunched forward, retractions, pulse > 120      No different from paseline  6. FEVER: "Do you have a fever?" If Yes, ask: "What is your temperature, how was it measured, and when did it start?"     No  7. CARDIAC HISTORY: "Do you have any history of heart disease?" (e.g., heart attack, congestive heart failure)      No  8. LUNG HISTORY: "Do you have any history of lung disease?"  (e.g., pulmonary embolus, asthma, emphysema)     Emphysema, COPD  9. PE RISK FACTORS: "Do  you have a history of blood clots?" (or: recent major surgery, recent prolonged travel, bedridden)     No  10. OTHER SYMPTOMS: "Do you have any other symptoms?" (e.g., runny nose, wheezing, chest pain)       No  11. PREGNANCY: "Is there any chance you are pregnant?" "When was your last menstrual period?"       LMP   12. TRAVEL: "Have you traveled out of the country in the last month?" (e.g., travel history, exposures)       iNo  Protocols used: Cough - Acute Non-Productive-A-AH

## 2024-03-04 NOTE — Telephone Encounter (Signed)
 I have notified the patinet. Dr. Aundria Rud I have laid a handicap placard form on your desk to sign for her (next week). I told her I would call her once it is signed.

## 2024-03-08 NOTE — Telephone Encounter (Signed)
 Form has been completed and I have notified the patient. She asked that I mail it to her. I have placed it in the mail.  Nothing further needed.

## 2024-03-09 DIAGNOSIS — I5030 Unspecified diastolic (congestive) heart failure: Secondary | ICD-10-CM

## 2024-03-09 HISTORY — DX: Unspecified diastolic (congestive) heart failure: I50.30

## 2024-03-11 ENCOUNTER — Inpatient Hospital Stay (HOSPITAL_BASED_OUTPATIENT_CLINIC_OR_DEPARTMENT_OTHER)
Admission: EM | Admit: 2024-03-11 | Discharge: 2024-03-17 | DRG: 189 | Disposition: A | Discharge status: Home / Self Care | Attending: Internal Medicine | Admitting: Internal Medicine

## 2024-03-11 ENCOUNTER — Encounter: Payer: Self-pay | Admitting: Nurse Practitioner

## 2024-03-11 ENCOUNTER — Emergency Department (HOSPITAL_BASED_OUTPATIENT_CLINIC_OR_DEPARTMENT_OTHER)

## 2024-03-11 ENCOUNTER — Ambulatory Visit: Payer: Self-pay | Admitting: Student in an Organized Health Care Education/Training Program

## 2024-03-11 ENCOUNTER — Ambulatory Visit (INDEPENDENT_AMBULATORY_CARE_PROVIDER_SITE_OTHER): Admitting: Nurse Practitioner

## 2024-03-11 ENCOUNTER — Encounter (HOSPITAL_BASED_OUTPATIENT_CLINIC_OR_DEPARTMENT_OTHER): Payer: Self-pay | Admitting: Emergency Medicine

## 2024-03-11 VITALS — BP 124/84 | HR 129 | Resp 26 | Ht 63.0 in | Wt 240.6 lb

## 2024-03-11 DIAGNOSIS — J4541 Moderate persistent asthma with (acute) exacerbation: Secondary | ICD-10-CM | POA: Diagnosis not present

## 2024-03-11 DIAGNOSIS — J432 Centrilobular emphysema: Secondary | ICD-10-CM

## 2024-03-11 DIAGNOSIS — G43909 Migraine, unspecified, not intractable, without status migrainosus: Secondary | ICD-10-CM | POA: Diagnosis present

## 2024-03-11 DIAGNOSIS — J45909 Unspecified asthma, uncomplicated: Secondary | ICD-10-CM | POA: Diagnosis present

## 2024-03-11 DIAGNOSIS — Z6841 Body Mass Index (BMI) 40.0 and over, adult: Secondary | ICD-10-CM

## 2024-03-11 DIAGNOSIS — Z1152 Encounter for screening for COVID-19: Secondary | ICD-10-CM

## 2024-03-11 DIAGNOSIS — J4520 Mild intermittent asthma, uncomplicated: Secondary | ICD-10-CM

## 2024-03-11 DIAGNOSIS — F330 Major depressive disorder, recurrent, mild: Secondary | ICD-10-CM | POA: Diagnosis present

## 2024-03-11 DIAGNOSIS — J439 Emphysema, unspecified: Secondary | ICD-10-CM | POA: Diagnosis present

## 2024-03-11 DIAGNOSIS — Z803 Family history of malignant neoplasm of breast: Secondary | ICD-10-CM

## 2024-03-11 DIAGNOSIS — J441 Chronic obstructive pulmonary disease with (acute) exacerbation: Secondary | ICD-10-CM | POA: Diagnosis not present

## 2024-03-11 DIAGNOSIS — J189 Pneumonia, unspecified organism: Secondary | ICD-10-CM | POA: Diagnosis present

## 2024-03-11 DIAGNOSIS — Z1231 Encounter for screening mammogram for malignant neoplasm of breast: Secondary | ICD-10-CM

## 2024-03-11 DIAGNOSIS — E66813 Obesity, class 3: Secondary | ICD-10-CM

## 2024-03-11 DIAGNOSIS — F419 Anxiety disorder, unspecified: Secondary | ICD-10-CM | POA: Diagnosis present

## 2024-03-11 DIAGNOSIS — K2289 Other specified disease of esophagus: Secondary | ICD-10-CM

## 2024-03-11 DIAGNOSIS — Z79899 Other long term (current) drug therapy: Secondary | ICD-10-CM

## 2024-03-11 DIAGNOSIS — J9601 Acute respiratory failure with hypoxia: Secondary | ICD-10-CM | POA: Diagnosis not present

## 2024-03-11 DIAGNOSIS — I2609 Other pulmonary embolism with acute cor pulmonale: Secondary | ICD-10-CM

## 2024-03-11 DIAGNOSIS — Z885 Allergy status to narcotic agent status: Secondary | ICD-10-CM

## 2024-03-11 DIAGNOSIS — M5442 Lumbago with sciatica, left side: Secondary | ICD-10-CM | POA: Diagnosis present

## 2024-03-11 DIAGNOSIS — Z0289 Encounter for other administrative examinations: Secondary | ICD-10-CM | POA: Diagnosis not present

## 2024-03-11 DIAGNOSIS — Z833 Family history of diabetes mellitus: Secondary | ICD-10-CM

## 2024-03-11 DIAGNOSIS — Z7951 Long term (current) use of inhaled steroids: Secondary | ICD-10-CM

## 2024-03-11 DIAGNOSIS — E785 Hyperlipidemia, unspecified: Secondary | ICD-10-CM | POA: Diagnosis present

## 2024-03-11 DIAGNOSIS — Z8249 Family history of ischemic heart disease and other diseases of the circulatory system: Secondary | ICD-10-CM

## 2024-03-11 DIAGNOSIS — G4733 Obstructive sleep apnea (adult) (pediatric): Secondary | ICD-10-CM | POA: Diagnosis present

## 2024-03-11 DIAGNOSIS — I5033 Acute on chronic diastolic (congestive) heart failure: Secondary | ICD-10-CM | POA: Diagnosis present

## 2024-03-11 DIAGNOSIS — I5030 Unspecified diastolic (congestive) heart failure: Secondary | ICD-10-CM

## 2024-03-11 DIAGNOSIS — G8929 Other chronic pain: Secondary | ICD-10-CM | POA: Diagnosis present

## 2024-03-11 DIAGNOSIS — Z888 Allergy status to other drugs, medicaments and biological substances status: Secondary | ICD-10-CM

## 2024-03-11 DIAGNOSIS — Z87891 Personal history of nicotine dependence: Secondary | ICD-10-CM

## 2024-03-11 HISTORY — DX: Unspecified asthma, uncomplicated: J45.909

## 2024-03-11 HISTORY — DX: Emphysema, unspecified: J43.9

## 2024-03-11 LAB — BRAIN NATRIURETIC PEPTIDE: B Natriuretic Peptide: 165.4 pg/mL — ABNORMAL HIGH (ref 0.0–100.0)

## 2024-03-11 LAB — CBC WITH DIFFERENTIAL/PLATELET
Abs Immature Granulocytes: 0.01 10*3/uL (ref 0.00–0.07)
Basophils Absolute: 0 10*3/uL (ref 0.0–0.1)
Basophils Relative: 1 %
Eosinophils Absolute: 0 10*3/uL (ref 0.0–0.5)
Eosinophils Relative: 0 %
HCT: 51.2 % — ABNORMAL HIGH (ref 36.0–46.0)
Hemoglobin: 16.3 g/dL — ABNORMAL HIGH (ref 12.0–15.0)
Immature Granulocytes: 0 %
Lymphocytes Relative: 9 %
Lymphs Abs: 0.6 10*3/uL — ABNORMAL LOW (ref 0.7–4.0)
MCH: 24.1 pg — ABNORMAL LOW (ref 26.0–34.0)
MCHC: 31.8 g/dL (ref 30.0–36.0)
MCV: 75.7 fL — ABNORMAL LOW (ref 80.0–100.0)
Monocytes Absolute: 0.1 10*3/uL (ref 0.1–1.0)
Monocytes Relative: 2 %
Neutro Abs: 5.6 10*3/uL (ref 1.7–7.7)
Neutrophils Relative %: 88 %
Platelets: 310 10*3/uL (ref 150–400)
RBC: 6.76 MIL/uL — ABNORMAL HIGH (ref 3.87–5.11)
RDW: 17.2 % — ABNORMAL HIGH (ref 11.5–15.5)
WBC: 6.3 10*3/uL (ref 4.0–10.5)
nRBC: 0 % (ref 0.0–0.2)

## 2024-03-11 LAB — BASIC METABOLIC PANEL WITH GFR
Anion gap: 13 (ref 5–15)
BUN: 16 mg/dL (ref 6–20)
CO2: 19 mmol/L — ABNORMAL LOW (ref 22–32)
Calcium: 9.3 mg/dL (ref 8.9–10.3)
Chloride: 103 mmol/L (ref 98–111)
Creatinine, Ser: 0.97 mg/dL (ref 0.44–1.00)
GFR, Estimated: >60 mL/min (ref >60)
Glucose, Bld: 98 mg/dL (ref 70–99)
Potassium: 5.3 mmol/L — ABNORMAL HIGH (ref 3.5–5.1)
Sodium: 135 mmol/L (ref 135–145)

## 2024-03-11 LAB — RESP PANEL BY RT-PCR (RSV, FLU A&B, COVID)  RVPGX2
Influenza A by PCR: NEGATIVE
Influenza B by PCR: NEGATIVE
Resp Syncytial Virus by PCR: NEGATIVE
SARS Coronavirus 2 by RT PCR: NEGATIVE

## 2024-03-11 LAB — TROPONIN I (HIGH SENSITIVITY)
Troponin I (High Sensitivity): 22 ng/L — ABNORMAL HIGH (ref <18)
Troponin I (High Sensitivity): 21 ng/L — ABNORMAL HIGH (ref <18)

## 2024-03-11 MED ORDER — IPRATROPIUM-ALBUTEROL 0.5-2.5 (3) MG/3ML IN SOLN
3.0000 mL | RESPIRATORY_TRACT | Status: DC
  Administered 2024-03-12: 3 mL via RESPIRATORY_TRACT
  Filled 2024-03-11: qty 3

## 2024-03-11 MED ORDER — SODIUM CHLORIDE 0.9 % IV SOLN
1.0000 g | Freq: Once | INTRAVENOUS | Status: AC
  Administered 2024-03-11: 1 g via INTRAVENOUS
  Filled 2024-03-11: qty 10

## 2024-03-11 MED ORDER — AZITHROMYCIN 250 MG PO TABS
ORAL_TABLET | ORAL | 0 refills | Status: DC
Start: 2024-03-11 — End: 2024-03-16

## 2024-03-11 MED ORDER — IPRATROPIUM-ALBUTEROL 0.5-2.5 (3) MG/3ML IN SOLN
RESPIRATORY_TRACT | Status: AC
  Administered 2024-03-11: 3 mL via RESPIRATORY_TRACT
  Filled 2024-03-11: qty 3

## 2024-03-11 MED ORDER — IOHEXOL 350 MG/ML SOLN
100.0000 mL | Freq: Once | INTRAVENOUS | Status: AC | PRN
  Administered 2024-03-11: 100 mL via INTRAVENOUS

## 2024-03-11 MED ORDER — PREDNISONE 20 MG PO TABS
40.0000 mg | ORAL_TABLET | Freq: Every day | ORAL | 0 refills | Status: DC
Start: 2024-03-11 — End: 2024-03-11

## 2024-03-11 MED ORDER — SODIUM CHLORIDE 0.9 % IV BOLUS
500.0000 mL | Freq: Once | INTRAVENOUS | Status: AC
  Administered 2024-03-11: 500 mL via INTRAVENOUS

## 2024-03-11 MED ORDER — ALBUTEROL SULFATE (2.5 MG/3ML) 0.083% IN NEBU
10.0000 mg | INHALATION_SOLUTION | Freq: Once | RESPIRATORY_TRACT | Status: AC
Start: 2024-03-11 — End: 2024-03-11

## 2024-03-11 MED ORDER — ALBUTEROL SULFATE (2.5 MG/3ML) 0.083% IN NEBU
2.5000 mg | INHALATION_SOLUTION | Freq: Once | RESPIRATORY_TRACT | Status: DC
Start: 2024-03-11 — End: 2024-04-21

## 2024-03-11 MED ORDER — ALBUTEROL SULFATE (2.5 MG/3ML) 0.083% IN NEBU
2.5000 mg | INHALATION_SOLUTION | Freq: Four times a day (QID) | RESPIRATORY_TRACT | 2 refills | Status: DC | PRN
Start: 2024-03-11 — End: 2024-03-12

## 2024-03-11 MED ORDER — ALBUTEROL SULFATE (2.5 MG/3ML) 0.083% IN NEBU
INHALATION_SOLUTION | RESPIRATORY_TRACT | Status: AC
  Administered 2024-03-11: 10 mg via RESPIRATORY_TRACT
  Filled 2024-03-11: qty 3

## 2024-03-11 MED ORDER — METHYLPREDNISOLONE SODIUM SUCC 40 MG IJ SOLR
40.0000 mg | Freq: Once | INTRAMUSCULAR | Status: AC
  Administered 2024-03-11: 40 mg via INTRAVENOUS
  Filled 2024-03-11: qty 1

## 2024-03-11 MED ORDER — SODIUM CHLORIDE 0.9 % IV SOLN
500.0000 mg | Freq: Once | INTRAVENOUS | Status: AC
  Administered 2024-03-11: 500 mg via INTRAVENOUS
  Filled 2024-03-11: qty 5

## 2024-03-11 MED ORDER — PREDNISONE 20 MG PO TABS
40.0000 mg | ORAL_TABLET | Freq: Every day | ORAL | 0 refills | Status: DC
Start: 2024-03-11 — End: 2024-03-16

## 2024-03-11 MED ORDER — DEXAMETHASONE SODIUM PHOSPHATE 10 MG/ML IJ SOLN
10.0000 mg | Freq: Once | INTRAMUSCULAR | Status: AC
Start: 2024-03-11 — End: 2024-03-11
  Administered 2024-03-11: 10 mg via INTRAMUSCULAR

## 2024-03-11 MED ORDER — AZITHROMYCIN 250 MG PO TABS
ORAL_TABLET | ORAL | 0 refills | Status: DC
Start: 2024-03-11 — End: 2024-03-11

## 2024-03-11 NOTE — ED Provider Notes (Signed)
 Shared PA visit.  History of asthma.  Symptoms last few days.  Hypoxic at primary care doctor's office.  Diminished breath sounds here.  Hypoxic here in the 80s.  Tachycardic.  Differential diagnosis likely asthma exacerbation but could be pneumonia PE ACS.  EKG shows sinus tachycardia.  Will give her continuous breathing treatment IV Solu-Medrol.  Anticipate admission given hypoxia.  I do think this is likely reactive airway process.  Please see PA note for further results evaluation, disposition of the patient.  Will continue to help with medical decision making.   Virgina Norfolk, DO 03/11/24 1934

## 2024-03-11 NOTE — Addendum Note (Signed)
 Addended by: Bonney Leitz on: 03/11/2024 04:45 PM   Modules accepted: Orders

## 2024-03-11 NOTE — Progress Notes (Unsigned)
 Not seen

## 2024-03-11 NOTE — ED Notes (Signed)
 Pt was sent by her doctor due to low oxygen. RT checked t he Pt as soon as she arrived and her oxygen was 70% on RA and he HR was high. RT put the Pt in a triage room and placed the Pt on a NRB and he SATS were 95% when we entered her room. Pt is on a Continuous neb at 8L.

## 2024-03-11 NOTE — Progress Notes (Signed)
 Plan of Care Note for accepted transfer   Patient: Virginia Camacho MRN: [id number]   DOA: 03/11/2024  Facility requesting transfer: [facility]   Requesting Provider: Jamison Oka, PA   Reason for transfer: Pneumonia   Facility course: 43 yr old female with asthma presents with SOB and cough. She was saturating in the 70s on room air. BNP is 165, troponin 22, and COVID/Flu/RSV negative. CTA is concerning for infection with superimposed edema not excluded.   She is being cultured and started on antibiotics. She was given IV steroids and nebs.   Plan of care: The patient is accepted for admission to Progressive unit, at [facility].   Author: Briscoe Deutscher, MD 03/11/2024  Check [email or web address] for on-call coverage.  Nursing staff, Please call [facility] Admits & Consults System-Wide number on Amion as soon as patient's arrival, so appropriate admitting provider can evaluate the pt.

## 2024-03-11 NOTE — Addendum Note (Signed)
 Addended byRaechel Chute on: 03/11/2024 04:06 PM   Modules accepted: Orders

## 2024-03-11 NOTE — Telephone Encounter (Signed)
 Copied from CRM [id number]. Topic: Clinical - Red Word Triage >> Mar 11, 2024  1:26 PM Orinda Kenner C wrote: [facility] that prompted transfer to Nurse Triage: Patient [phone] states her work was suppose to fax a disability forms, has the office received it yet? Patient states the pollen has been really bad and giving her a flare up her asthma and emphysema. Symptoms are coughing no phlegm, feels like mucous is stuck in the throat, shortness of breath when walking around; denies no fever nor pain. A few weeks back Prednisone and antibiotics was prescribed and it was helpful, can that be prescribed again? Please advise.  TRIAGE SUMMARY NOTE: Pt reporting symptoms over phone, taking multiple breaths per phrase, every few words. Pt confirms she "just did a lot of walking." Pt reporting she has been more SOB with exertion for past few days, coughing, coughing up the occasional clear sputum, using diaphragmatic breathing to help with SOB, "haven't had to use my rescue inhaler yet." Advised pt use her rescue inhaler, pt using inhaler while on phone. Pt confirms a few minutes later that she feels improved after using the inhaler. Reiterated that pt can use rescue inhaler every 4 hours as needed, if speaking in phrases again then can do rescue treatments of inhaler 20 min apart, advised that if worsening such as being as short of breath as she was at beginning of phone call or worse with no help from inhalers, get to the hospital. Pt verbalized understanding. Advised pt be examined in next 4 hours to ensure doing okay, no availability with pulmonary today, scheduled with PCP. Pt was already on schedule for video visit tomorrow to discuss new diagnosis and FMLA paperwork, kept on schedule at this time in case no time to go over these items in office today. Please advise if further recommendations.  E2C2 Pulmonary Triage - Initial Assessment Questions "Chief Complaint (e.g., cough, sob, wheezing, fever, chills, sweat or  additional symptoms) *Go to specific symptom protocol after initial questions. Coughing not coughing anything up, think pollen flaring me up, been working, lots of exposure to pollen Sounds labored Mouth breather so have to diaphragmatic breathing do a few reps Haven't had to use my rescue inhaler yet Just did lot of walking Advised albuterol inhaler No SOB at rest Anxiety has kicked up since Other than that fine Feels like mucus stuck but cough it up clear Wheezing not a lot, hearing it at rest, very faint not loud Anxiety is up so at times heart racing at night when trying to go to sleep at times, at times loose BM Appt with PCP tomorrow for talking about nerves  "How long have symptoms been present?" Past few days, cough since last week  "Have you used your inhalers/maintenance medication?" Yes If yes, "What medications?" Wixella and spiriva, not been using rescue inhaler Tried OTC cough syrup Cough consistent past 2 nights  OXYGEN: "Do you wear supplemental oxygen?" No  "Do you monitor your oxygen levels?" No  Reason for Disposition  [1] Wheezing or coughing AND [2] hasn't used neb or inhaler (up to 3 treatments given 20 minutes apart) AND [3] it's available  Answer Assessment - Initial Assessment Questions 7. LUNG HISTORY: "Do you have any history of lung disease?"  (e.g., pulmonary embolus, asthma, emphysema)     Asthma, emphysema 8. CAUSE: "What do you think is causing the breathing problem?"      Pollen exposure causing exacerbation  Protocols used: Breathing Difficulty-A-AH, Asthma Attack-A-AH

## 2024-03-11 NOTE — ED Triage Notes (Signed)
 Sent from PCP. SHOB and increased WOB. Sats 70. Placed on NRB in triage.   Recently dx w/ emphysema. Given albuterol and steroids.

## 2024-03-11 NOTE — Telephone Encounter (Signed)
 Per secure chat from Dr. Aundria Rud, yes please. thank you   I have called and canceled the prescriptions that Dr. Aundria Rud sent in.  Nothing further needed.

## 2024-03-11 NOTE — Progress Notes (Addendum)
 BP 124/84   Pulse (!) 129   Resp (!) 26   Ht 5\' 3"  (1.6 m)   Wt 240 lb 9.6 oz (109.1 kg)   LMP 02/24/2024   SpO2 (!) 86%   BMI 42.62 kg/m    Subjective:    Patient ID: Virginia Camacho, female    DOB: [birth year], 43 y.o.   MRN: [id number]  HPI: Virginia Camacho is a 43 y.o. female  Chief Complaint  Patient presents with   Asthma    W/ SOB wheezing and coughing.  Pulmonologist dx her w/ emphysema   Anxiety    Needs flma wants to be out of work    Discussed the use of AI scribe software for clinical note transcription with the patient, who gave verbal consent to proceed.  History of Present Illness Virginia Camacho is a 43 year old female with asthma and emphysema who presents with shortness of breath and difficulty breathing.  She experiences significant shortness of breath and difficulty breathing. Her symptoms have been exacerbated by her work schedule, which often exceeds 50 hours a week, and she notes that her breathing difficulties have been worsened by her lack of rest and recovery time.  She recently started using Spiriva, two pumps daily, about a week ago, and uses albuterol as needed, which provides instant relief. She  is currently taking Delsym for her cough. She feels inflamed. She has been using her rescue inhaler more frequently than usual, indicating persistent inflammation.  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 She is concerned about her ability to manage her symptoms while maintaining her work schedule and is considering short-term disability to focus on her health.   She has been experiencing increased anxiety, which she attributes to her breathing difficulties and recent emphysema diagnosis. She mentions only sleeping three hours the previous night due to anxiety and breathing issues.    Her oxygen saturation was 78 % on room air.  Patient did not want to go to the emergency room.  We  attempted to treat her out patient.  We gave her a decadron 10 mg IM injection,  nebulizer treatment.  Improvement was noted on breathing but as we were going over discharge information, breathing became more difficult and oxygen dropped down to 86 %.  Recommend patient go to emergency room. She declined EMS and is going to [facility]. Report called to triage nurse.      03/11/2024    2:51 PM 10/22/2023    3:11 PM 04/21/2023   10:08 AM  Depression screen PHQ 2/9  Decreased Interest 0 0 0  Down, Depressed, Hopeless 0  0  PHQ - 2 Score 0 0 0  Altered sleeping 1  0  Tired, decreased energy 1  0  Change in appetite 1  0  Feeling bad or failure about yourself  0  0  Trouble concentrating 0  0  Moving slowly or fidgety/restless 0  0  Suicidal thoughts 0  0  PHQ-9 Score 3  0  Difficult doing work/chores Not difficult at all  Not difficult at all    Relevant past medical, surgical, family and social history reviewed and updated as indicated. Interim medical history since our last visit reviewed. Allergies and medications reviewed and updated.  Review of Systems  Ten systems reviewed and is negative except as mentioned in HPI     Objective:  BP 124/84   Pulse (!) 129   Resp (!) 26   Ht 5\' 3"  (1.6 m)   Wt 240 lb 9.6 oz (109.1 kg)   LMP 02/24/2024   SpO2 (!) 86%   BMI 42.62 kg/m    Wt Readings from Last 3 Encounters:  03/11/24 240 lb 9.6 oz (109.1 kg)  02/23/24 236 lb 9.6 oz (107.3 kg)  10/22/23 243 lb 11.2 oz (110.5 kg)    Physical Exam Physical Exam VITALS: P- 22, BP- 122/78, RR- 30, SaO2- 86% GENERAL: Alert, cooperative, well developed, no acute distress. HEENT: Normocephalic, normal oropharynx, moist mucous membranes. CHEST: diminished breath sounds,  wheezes, rhonchi, no crackles. CARDIOVASCULAR: Normal heart rate and rhythm, S1 and S2 normal without murmurs. ABDOMEN: Soft, non-tender, non-distended, without organomegaly, normal bowel sounds. EXTREMITIES: No  cyanosis or edema. NEUROLOGICAL: Cranial nerves grossly intact, moves all extremities without gross motor or sensory deficit.   Results for orders placed or performed in visit on 02/25/24  ANA 12 Plus Profile (RDL)   Collection Time: 03/03/24 10:47 AM  Result Value Ref Range   Anti-Nuclear Ab by IFA (RDL) Positive (A) Negative  Alpha-1-antitrypsin   Collection Time: 03/03/24 10:47 AM  Result Value Ref Range   A-1 Antitrypsin 153 101 - 187 mg/dL  ANA 12 Plus Profile, Positive   Collection Time: 03/03/24 10:47 AM  Result Value Ref Range   Speckled Pattern 1:160 (H) <1:40   Note: Comment    Anti-Centromere Ab (RDL) <1:40 <1:40   Anti-dsDNA Ab by Farr(RDL) WILL FOLLOW    Anti-Sm Ab (RDL) WILL FOLLOW    Anti-U1 RNP Ab (RDL) WILL FOLLOW    Anti-Ro (SS-A) Ab (RDL) WILL FOLLOW    Anti-La (SS-B) Ab (RDL) WILL FOLLOW    Anti-Scl-70 Ab (RDL) WILL FOLLOW    Anti-Cardiolipin Ab, IgG (RDL) WILL FOLLOW    Anti-Cardiolipin Ab, IgA (RDL) WILL FOLLOW    Anti-Cardiolipin Ab, IgM (RDL) WILL FOLLOW    C3 Complement (RDL) WILL FOLLOW    C4 Complement (RDL) WILL FOLLOW    Anti-TPO Ab (RDL) WILL FOLLOW    Anti-Chromatin Ab, IgG (RDL) WILL FOLLOW    Anti-CCP Ab, IgG & IgA (RDL) <20 <20 Units   Rheumatoid Factor by Turb RDL WILL FOLLOW    ANA Plus 12 Interpretation Comment        Assessment & Plan:   Problem List Items Addressed This Visit       Respiratory   Moderate persistent asthma with acute exacerbation - Primary   Relevant Medications   albuterol (PROVENTIL) (2.5 MG/3ML) 0.083% nebulizer solution 2.5 mg   albuterol (PROVENTIL) (2.5 MG/3ML) 0.083% nebulizer solution   azithromycin (ZITHROMAX) 250 MG tablet   predniSONE (DELTASONE) 20 MG tablet   Other Relevant Orders   For home use only DME Nebulizer machine   Centrilobular emphysema (HCC)   Relevant Medications   albuterol (PROVENTIL) (2.5 MG/3ML) 0.083% nebulizer solution 2.5 mg   albuterol (PROVENTIL) (2.5 MG/3ML) 0.083%  nebulizer solution   azithromycin (ZITHROMAX) 250 MG tablet   predniSONE (DELTASONE) 20 MG tablet   Other Relevant Orders   For home use only DME Nebulizer machine   Other Visit Diagnoses       Encounter for completion of form with patient         COPD exacerbation (HCC)       Relevant Medications   dexamethasone (DECADRON) injection 10 mg (Completed)   albuterol (PROVENTIL) (2.5 MG/3ML) 0.083% nebulizer solution 2.5 mg   albuterol (  PROVENTIL) (2.5 MG/3ML) 0.083% nebulizer solution   azithromycin (ZITHROMAX) 250 MG tablet   predniSONE (DELTASONE) 20 MG tablet   Other Relevant Orders   For home use only DME Nebulizer machine     Encounter for screening mammogram for malignant neoplasm of breast            Assessment and Plan Assessment & Plan Emphysema exacerbation She is experiencing an exacerbation of emphysema, characterized by significant dyspnea, tachypnea, and hypoxemia. The exacerbation is likely due to recent work-related stress, lack of rest, and exposure to environmental pollutants. Anxiety exacerbates her dyspnea. She is currently on Spiriva and uses albuterol PRN, which provides immediate relief. She has been advised to take short-term disability to manage her condition better. Informed consent was provided regarding the use of steroids.The benefits of short-term steroid use were discussed, emphasizing the need to balance risks and benefits. - Administer Decadron 10 mg IM. - Administer albuterol nebulizer treatment. - Reassess oxygen levels post-treatment. - Prescribe prednisone, to be started the day after the Decadron injection. - Prescribe azithromycin, to be started immediately. - Provide a nebulizer and associated medications for home use. - Advise her to purchase a pulse oximeter for home monitoring. - Instruct her to go to the emergency room if oxygen saturation drops below 90%. - Complete FMLA paperwork for five weeks of short-term disability.  Asthma She has  asthma and uses albuterol PRN. The current exacerbation of respiratory symptoms may be related to both asthma and emphysema. Albuterol provides immediate relief, indicating its effectiveness in managing asthma symptoms. - Continue albuterol PRN for asthma symptoms. - Educate her on proper inhaler technique to ensure effective medication delivery.  Anxiety related to respiratory issues She reports increased anxiety, particularly related to her recent emphysema diagnosis and dyspnea. Anxiety exacerbates her perception of breathlessness, creating a cycle of stress and respiratory distress. Education was provided on the physiological response to anxiety and its impact on breathing. - Educate her on the physiological response to anxiety and its impact on breathing. - Advise her to rest and manage stress to help alleviate anxiety symptoms.  Follow-up She is scheduled for a follow-up appointment in May to reassess her respiratory status and adjust treatment as necessary. - Schedule follow-up appointment in May. - Advise her to send a message if there are any changes or concerns before the scheduled follow-up.    Her oxygen saturation was 78 % on room air.  Patient did not want to go to the emergency room.  We attempted to treat her out patient.  We gave her a decadron 10 mg IM injection,  nebulizer treatment.  Improvement was noted on breathing but as we were going over discharge information, breathing became more difficult and oxygen dropped down to 86 %.  Recommend patient go to emergency room. She declined EMS and is going to [facility] ER [facility]. Report called to triage nurse.     Follow up plan: Return if symptoms worsen or fail to improve.

## 2024-03-11 NOTE — Telephone Encounter (Signed)
 She saw her PCP today and they gave her an antibiotic and prednisone. Can I cancel the scripts you sent in?

## 2024-03-11 NOTE — ED Provider Notes (Signed)
 [place] EMERGENCY DEPARTMENT AT [facility] Provider Note   CSN: [id number] Arrival date & time: 03/11/24  1738     History  Chief Complaint  Patient presents with   Shortness of Breath    Virginia Camacho is a 43 y.o. female with history of asthma and recent diagnosis of emphysema presents with complaints of shortness of breath.  Was evaluated at her primary care office.  With sats in the 70s was given albuterol and steroids and referred here.  She has no cough, congestion or sick symptoms.  No chest pain.  No history of heart failure.   Shortness of Breath  Past Medical History:  Diagnosis Date   Allergy    Asthma    Back pain    Emphysema lung (HCC)    Morbid obesity with BMI of 40.0-44.9, adult (HCC) 03/20/2016       Home Medications Prior to Admission medications   Medication Sig Start Date End Date Taking? Authorizing Provider  acetaminophen (TYLENOL) 650 MG CR tablet Take 650 mg by mouth every 8 (eight) hours as needed.    [provider]  albuterol (PROVENTIL) (2.5 MG/3ML) 0.083% nebulizer solution Take 3 mLs (2.5 mg total) by nebulization every 6 (six) hours as needed for wheezing or shortness of breath. 03/11/24   Berniece Salines, FNP  albuterol (VENTOLIN HFA) 108 (90 Base) MCG/ACT inhaler Inhale 2 puffs into the lungs every 6 (six) hours as needed for wheezing or shortness of breath. 02/12/24   Berniece Salines, FNP  azithromycin (ZITHROMAX) 250 MG tablet Take 2 tablets on day 1, then 1 tablet daily on days 2 through 5 03/11/24 03/16/24  Berniece Salines, FNP  Iron, Ferrous Sulfate, 325 (65 Fe) MG TABS Take 325 mg by mouth daily. 11/17/23   Berniece Salines, FNP  meclizine (ANTIVERT) 25 MG tablet Take 1 tablet (25 mg total) by mouth 3 (three) times daily as needed for dizziness. 06/17/22   Berniece Salines, FNP  metoprolol tartrate (LOPRESSOR) 100 MG tablet Take 1 tablet (100 mg total) by mouth once for 1 dose. TWO HOURS PRIOR TO CARDIAC CTA 06/18/23 06/18/23   Debbe Odea, MD  mometasone (ELOCON) 0.1 % cream Apply to affected area once daily or as needed. 06/14/22   Berniece Salines, FNP  omeprazole (PRILOSEC) 20 MG capsule Take 1 capsule (20 mg total) by mouth daily. 04/21/23   Berniece Salines, FNP  predniSONE (DELTASONE) 20 MG tablet Take 2 tablets (40 mg total) by mouth daily with breakfast for 5 days. 03/11/24 03/16/24  Berniece Salines, FNP  promethazine-dextromethorphan (PROMETHAZINE-DM) 6.25-15 MG/5ML syrup Take 5 mLs by mouth 4 (four) times daily as needed for cough. 10/22/23   Berniece Salines, FNP  Tiotropium Bromide Monohydrate (SPIRIVA RESPIMAT) 2.5 MCG/ACT AERS Inhale 2 puffs into the lungs daily. 02/25/24   Raechel Chute, MD  Monte Fantasia INHUB 100-50 MCG/ACT AEPB INHALE 1 DOSE BY MOUTH TWICE A DAY 01/30/24   Berniece Salines, FNP      Allergies    Clarithromycin and Oxycodone    Review of Systems   Review of Systems  Respiratory:  Positive for shortness of breath.     Physical Exam Updated Vital Signs BP (!) 180/115   Pulse (!) 121   Temp 98 F (36.7 C) (Oral)   Resp (!) 22   LMP 02/24/2024   SpO2 91%  Physical Exam Vitals and nursing note reviewed.  Constitutional:      General: She  is not in acute distress.    Appearance: She is well-developed.  HENT:     Head: Normocephalic and atraumatic.  Eyes:     Conjunctiva/sclera: Conjunctivae normal.  Cardiovascular:     Rate and Rhythm: Normal rate and regular rhythm.     Heart sounds: No murmur heard. Pulmonary:     Comments: Increased respiratory effort, lung sounds are diminished Abdominal:     Palpations: Abdomen is soft.     Tenderness: There is no abdominal tenderness.  Musculoskeletal:     Cervical back: Neck supple.     Comments: Trace bilateral nonpitting edema  Skin:    General: Skin is warm and dry.     Capillary Refill: Capillary refill takes less than 2 seconds.  Neurological:     Mental Status: She is alert.  Psychiatric:        Mood and Affect: Mood normal.      ED Results / Procedures / Treatments   Labs (all labs ordered are listed, but only abnormal results are displayed) Labs Reviewed  CBC WITH DIFFERENTIAL/PLATELET - Abnormal; Notable for the following components:      Result Value   RBC 6.76 (*)    Hemoglobin 16.3 (*)    HCT 51.2 (*)    MCV 75.7 (*)    MCH 24.1 (*)    RDW 17.2 (*)    Lymphs Abs 0.6 (*)    All other components within normal limits  BASIC METABOLIC PANEL WITH GFR - Abnormal; Notable for the following components:   Potassium 5.3 (*)    CO2 19 (*)    All other components within normal limits  BRAIN NATRIURETIC PEPTIDE - Abnormal; Notable for the following components:   B Natriuretic Peptide 165.4 (*)    All other components within normal limits  TROPONIN I (HIGH SENSITIVITY) - Abnormal; Notable for the following components:   Troponin I (High Sensitivity) 22 (*)    All other components within normal limits  TROPONIN I (HIGH SENSITIVITY) - Abnormal; Notable for the following components:   Troponin I (High Sensitivity) 21 (*)    All other components within normal limits  RESP PANEL BY RT-PCR (RSV, FLU A&B, COVID)  [id number]  CULTURE, BLOOD (ROUTINE X 2)  CULTURE, BLOOD (ROUTINE X 2)    EKG EKG Interpretation Date/Time:  Thursday March 11 2024 17:57:50 EDT Ventricular Rate:  128 PR Interval:  152 QRS Duration:  79 QT Interval:  298 QTC Calculation: 430 R Axis:   123  Text Interpretation: Sinus tachycardia Consider right atrial enlargement Right axis deviation Borderline T abnormalities, anterior leads Artifact in lead(s) I II aVR Confirmed by Virgina Norfolk [phone]) on 03/11/2024 7:20:34 PM  Radiology CT Angio Chest PE W and/or Wo Contrast Result Date: 03/11/2024 CLINICAL DATA:  Letter intermediate probably pulmonary embolism, positive D-dimer, dyspnea, hypoxia EXAM: CT ANGIOGRAPHY CHEST WITH CONTRAST TECHNIQUE: Multidetector CT imaging of the chest was performed using the standard protocol during bolus  administration of intravenous contrast. Multiplanar CT image reconstructions and MIPs were obtained to evaluate the vascular anatomy. RADIATION DOSE REDUCTION: This exam was performed according to the departmental dose-optimization program which includes automated exposure control, adjustment of the mA and/or kV according to patient size and/or use of iterative reconstruction technique. CONTRAST:  OMNIPAQUE IOHEXOL 350 MG/ML SOLN COMPARISON:  02/23/2024 FINDINGS: Cardiovascular: Adequate opacification of the pulmonary arterial tree. No intraluminal filling defect identified to suggest acute pulmonary embolism. Central pulmonary arteries are of normal caliber. No significant coronary artery  calcification. Cardiac size within normal limits. No pericardial effusion. No significant atherosclerotic calcification within the thoracic aorta. No aortic aneurysm. 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 This is not well characterized on this examination due to phase of contrast enhancement. As noted previously, this may represent a cystic collection such as a foregut duplication cyst though a esophageal mass such as a leiomyoma or lymphadenopathy is not completely excluded on this limited examination. Visualized thyroid is unremarkable. The esophagus is otherwise unremarkable. Lungs/Pleura: Severe centrilobular emphysema is again identified. There has developed superimposed diffuse interstitial infiltrate with more extensive consolidation within the right lower lobe most in keeping with acute infection. A component of superimposed edema is difficult to exclude. No pneumothorax or pleural effusion. No central obstructing lesion. Upper Abdomen: No acute abnormality. Musculoskeletal: No chest wall abnormality. No acute or significant osseous findings. Review of the MIP images confirms the above findings. IMPRESSION: 1. No  pulmonary embolism. 2. Interval development of diffuse interstitial infiltrate with more extensive consolidation within the right lower lobe most in keeping with acute infection. A component of superimposed edema is difficult to exclude. 3. Stable retrocardiac, paraesophageal low-attenuation mass measuring at least 5.0 x 8.5 x 10.3 cm in greatest dimension. This is not well characterized on this examination due to phase of contrast enhancement. As noted previously, this may represent a cystic collection such as a foregut duplication cyst though a esophageal mass such as a leiomyoma or lymphadenopathy is not completely excluded on this limited examination. If indicated, this could be better assessed with contrast enhanced MRI examination or endoscopic ultrasound. Emphysema (ICD10-J43.9). Electronically Signed   By: Helyn Numbers M.D.   On: 03/11/2024 20:00   DG Chest Portable 1 View Result Date: 03/11/2024 CLINICAL DATA:  Shortness of breath EXAM: PORTABLE CHEST 1 VIEW COMPARISON:  Chest x-ray 02/09/2024.  Chest CT 02/23/2024. FINDINGS: Heart size is within normal limits. Lower mediastinal masslike opacity appears unchanged from prior CT. Diffuse emphysematous changes are again noted. Both lungs are clear. The visualized skeletal structures are unremarkable. IMPRESSION: No active disease. Electronically Signed   By: Darliss Cheney M.D.   On: 03/11/2024 18:31    Procedures Procedures    Medications Ordered in ED Medications  ipratropium-albuterol (DUONEB) 0.5-2.5 (3) MG/3ML nebulizer solution 3 mL (3 mLs Nebulization Given 03/11/24 2037)  cefTRIAXone (ROCEPHIN) 1 g in sodium chloride 0.9 % 100 mL IVPB (has no administration in time range)  azithromycin (ZITHROMAX) 500 mg in sodium chloride 0.9 % 250 mL IVPB (has no administration in time range)  methylPREDNISolone sodium succinate (SOLU-MEDROL) 40 mg/mL injection 40 mg (40 mg Intravenous Given 03/11/24 1823)  albuterol (PROVENTIL) (2.5 MG/3ML) 0.083%  nebulizer solution 10 mg (10 mg Nebulization Given 03/11/24 1754)  iohexol (OMNIPAQUE) 350 MG/ML injection 100 mL (100 mLs Intravenous Contrast Given 03/11/24 1931)  sodium chloride 0.9 % bolus 500 mL (500 mLs Intravenous New Bag/Given 03/11/24 2010)    ED Course/ Medical Decision Making/ A&P                                 Medical Decision Making  This patient presents to the ED with chief complaint(s) of shortness of breath.  The complaint involves an extensive differential diagnosis and also carries with it a high risk of complications and morbidity.   pertinent past medical history as listed in HPI  The differential  diagnosis includes  ACS, PE, aortic dissection, pneumonia, pneumothorax,  URI, bronchitis, COPD, asthma, CHF  The initial plan is to  Will start with basic labs, EKG, chest x-ray Additional history obtained: Additional history obtained from family Records reviewed Care Everywhere/External Records  Initial Assessment:   Patient presents hypertensive 190/99, tachycardic to 130s and tachypneic with complaints of shortness of breath.  Has a history of asthma and was recently diagnosed with emphysema.  She has no chest pain to suggest ACS.  No cough, fever or congestion to suggest underlying infectious etiology.  She does have trace bilateral pitting edema but denies any history of heart failure.  Independent ECG interpretation:  Sinus tachycardia  Independent labs interpretation:  The following labs were independently interpreted:  CBC without significant abnormality, BMP with hyperkalemia 5.3, BNP elevated to 165.4, troponin elevated 22  Independent visualization and interpretation of imaging: I independently visualized the following imaging with scope of interpretation limited to determining acute life threatening conditions related to emergency care:  CXR, which revealed no active disease CTA chest no PE, development of right lower lobe infiltrate consistent with  infection, stable paraesophageal mass Treatment and Reassessment: Patient placed on nonrebreather in triage and started on continuous albuterol and given methylprednisone  Upon reassessment patient's symptoms have somewhat improved.  She remains tachycardic 1 15-1 20, she is still requiring oxygen but down to 3 L, lung sounds are improved but still diminished, still no wheezing.  Will give 2 more DuoNebs.  CTA additionally suggestive of infiltrate.  Will cover empirically with antibiotics for pneumonia.  Consultations obtained:   Hospitalist Dr. Antionette Char agreed for admission  Disposition:   Patient will be admitted for COPD exacerbation  Social Determinants of Health:   none  This note was dictated with voice recognition software.  Despite best efforts at proofreading, errors may have occurred which can change the documentation meaning.          Final Clinical Impression(s) / ED Diagnoses Final diagnoses:  COPD exacerbation [facility])    Rx / DC Orders ED Discharge Orders     None         Fabienne Bruns 03/11/24 2159    Virgina Norfolk, DO 03/11/24 2247

## 2024-03-12 ENCOUNTER — Inpatient Hospital Stay (HOSPITAL_COMMUNITY)

## 2024-03-12 ENCOUNTER — Telehealth (INDEPENDENT_AMBULATORY_CARE_PROVIDER_SITE_OTHER): Admitting: Nurse Practitioner

## 2024-03-12 ENCOUNTER — Other Ambulatory Visit: Payer: Self-pay

## 2024-03-12 DIAGNOSIS — G4733 Obstructive sleep apnea (adult) (pediatric): Secondary | ICD-10-CM | POA: Diagnosis present

## 2024-03-12 DIAGNOSIS — F330 Major depressive disorder, recurrent, mild: Secondary | ICD-10-CM | POA: Diagnosis present

## 2024-03-12 DIAGNOSIS — E8770 Fluid overload, unspecified: Secondary | ICD-10-CM | POA: Diagnosis not present

## 2024-03-12 DIAGNOSIS — Z87891 Personal history of nicotine dependence: Secondary | ICD-10-CM | POA: Diagnosis not present

## 2024-03-12 DIAGNOSIS — Z6841 Body Mass Index (BMI) 40.0 and over, adult: Secondary | ICD-10-CM | POA: Diagnosis not present

## 2024-03-12 DIAGNOSIS — I5033 Acute on chronic diastolic (congestive) heart failure: Secondary | ICD-10-CM | POA: Diagnosis present

## 2024-03-12 DIAGNOSIS — M5442 Lumbago with sciatica, left side: Secondary | ICD-10-CM | POA: Diagnosis present

## 2024-03-12 DIAGNOSIS — J441 Chronic obstructive pulmonary disease with (acute) exacerbation: Secondary | ICD-10-CM | POA: Diagnosis present

## 2024-03-12 DIAGNOSIS — Z833 Family history of diabetes mellitus: Secondary | ICD-10-CM | POA: Diagnosis not present

## 2024-03-12 DIAGNOSIS — I5031 Acute diastolic (congestive) heart failure: Secondary | ICD-10-CM

## 2024-03-12 DIAGNOSIS — Z1152 Encounter for screening for COVID-19: Secondary | ICD-10-CM | POA: Diagnosis not present

## 2024-03-12 DIAGNOSIS — J4541 Moderate persistent asthma with (acute) exacerbation: Secondary | ICD-10-CM | POA: Diagnosis present

## 2024-03-12 DIAGNOSIS — E785 Hyperlipidemia, unspecified: Secondary | ICD-10-CM

## 2024-03-12 DIAGNOSIS — Z885 Allergy status to narcotic agent status: Secondary | ICD-10-CM | POA: Diagnosis not present

## 2024-03-12 DIAGNOSIS — J189 Pneumonia, unspecified organism: Secondary | ICD-10-CM | POA: Diagnosis present

## 2024-03-12 DIAGNOSIS — E8779 Other fluid overload: Secondary | ICD-10-CM | POA: Diagnosis not present

## 2024-03-12 DIAGNOSIS — Z79899 Other long term (current) drug therapy: Secondary | ICD-10-CM | POA: Diagnosis not present

## 2024-03-12 DIAGNOSIS — J9601 Acute respiratory failure with hypoxia: Secondary | ICD-10-CM | POA: Diagnosis present

## 2024-03-12 DIAGNOSIS — J439 Emphysema, unspecified: Secondary | ICD-10-CM | POA: Diagnosis present

## 2024-03-12 DIAGNOSIS — Z8249 Family history of ischemic heart disease and other diseases of the circulatory system: Secondary | ICD-10-CM | POA: Diagnosis not present

## 2024-03-12 DIAGNOSIS — Z7951 Long term (current) use of inhaled steroids: Secondary | ICD-10-CM | POA: Diagnosis not present

## 2024-03-12 DIAGNOSIS — F419 Anxiety disorder, unspecified: Secondary | ICD-10-CM

## 2024-03-12 DIAGNOSIS — Z803 Family history of malignant neoplasm of breast: Secondary | ICD-10-CM | POA: Diagnosis not present

## 2024-03-12 DIAGNOSIS — K2289 Other specified disease of esophagus: Secondary | ICD-10-CM | POA: Diagnosis not present

## 2024-03-12 DIAGNOSIS — G8929 Other chronic pain: Secondary | ICD-10-CM | POA: Diagnosis present

## 2024-03-12 DIAGNOSIS — Z888 Allergy status to other drugs, medicaments and biological substances status: Secondary | ICD-10-CM | POA: Diagnosis not present

## 2024-03-12 DIAGNOSIS — G43909 Migraine, unspecified, not intractable, without status migrainosus: Secondary | ICD-10-CM | POA: Diagnosis present

## 2024-03-12 LAB — CBC WITH DIFFERENTIAL/PLATELET
Abs Immature Granulocytes: 0.01 10*3/uL (ref 0.00–0.07)
Basophils Absolute: 0 10*3/uL (ref 0.0–0.1)
Basophils Relative: 0 %
Eosinophils Absolute: 0 10*3/uL (ref 0.0–0.5)
Eosinophils Relative: 0 %
HCT: 51.7 % — ABNORMAL HIGH (ref 36.0–46.0)
Hemoglobin: 16.3 g/dL — ABNORMAL HIGH (ref 12.0–15.0)
Immature Granulocytes: 0 %
Lymphocytes Relative: 13 %
Lymphs Abs: 0.7 10*3/uL (ref 0.7–4.0)
MCH: 24.4 pg — ABNORMAL LOW (ref 26.0–34.0)
MCHC: 31.5 g/dL (ref 30.0–36.0)
MCV: 77.5 fL — ABNORMAL LOW (ref 80.0–100.0)
Monocytes Absolute: 0.1 10*3/uL (ref 0.1–1.0)
Monocytes Relative: 2 %
Neutro Abs: 4.8 10*3/uL (ref 1.7–7.7)
Neutrophils Relative %: 85 %
Platelets: 318 10*3/uL (ref 150–400)
RBC: 6.67 MIL/uL — ABNORMAL HIGH (ref 3.87–5.11)
RDW: 17.4 % — ABNORMAL HIGH (ref 11.5–15.5)
WBC: 5.7 10*3/uL (ref 4.0–10.5)
nRBC: 0 % (ref 0.0–0.2)

## 2024-03-12 LAB — ECHOCARDIOGRAM COMPLETE
AR max vel: 2.29 cm2
AV Area VTI: 2.24 cm2
AV Area mean vel: 1.94 cm2
AV Mean grad: 2 mmHg
AV Peak grad: 3.9 mmHg
Ao pk vel: 0.99 m/s
Calc EF: 50.6 %
Est EF: 55
Height: 63 in
S' Lateral: 2.8 cm
Single Plane A2C EF: 57 %
Single Plane A4C EF: 44.1 %
Weight: 3849.6 [oz_av]

## 2024-03-12 LAB — COMPREHENSIVE METABOLIC PANEL WITH GFR
ALT: 47 U/L — ABNORMAL HIGH (ref 0–44)
AST: 32 U/L (ref 15–41)
Albumin: 3.5 g/dL (ref 3.5–5.0)
Alkaline Phosphatase: 33 U/L — ABNORMAL LOW (ref 38–126)
Anion gap: 13 (ref 5–15)
BUN: 14 mg/dL (ref 6–20)
CO2: 16 mmol/L — ABNORMAL LOW (ref 22–32)
Calcium: 9.2 mg/dL (ref 8.9–10.3)
Chloride: 108 mmol/L (ref 98–111)
Creatinine, Ser: 0.87 mg/dL (ref 0.44–1.00)
GFR, Estimated: >60 mL/min (ref >60)
Glucose, Bld: 156 mg/dL — ABNORMAL HIGH (ref 70–99)
Potassium: 4 mmol/L (ref 3.5–5.1)
Sodium: 137 mmol/L (ref 135–145)
Total Bilirubin: 1.4 mg/dL — ABNORMAL HIGH (ref 0.0–1.2)
Total Protein: 7.2 g/dL (ref 6.5–8.1)

## 2024-03-12 LAB — GLUCOSE, CAPILLARY: Glucose-Capillary: 125 mg/dL — ABNORMAL HIGH (ref 70–99)

## 2024-03-12 LAB — HIV ANTIBODY (ROUTINE TESTING W REFLEX): HIV Screen 4th Generation wRfx: NONREACTIVE

## 2024-03-12 MED ORDER — SODIUM CHLORIDE 0.9 % IV SOLN
2.0000 g | INTRAVENOUS | Status: DC
  Administered 2024-03-12 – 2024-03-15 (×4): 2 g via INTRAVENOUS
  Filled 2024-03-12 (×3): qty 20

## 2024-03-12 MED ORDER — FUROSEMIDE 10 MG/ML IJ SOLN
40.0000 mg | Freq: Once | INTRAMUSCULAR | Status: AC
  Administered 2024-03-12: 40 mg via INTRAVENOUS
  Filled 2024-03-12: qty 4

## 2024-03-12 MED ORDER — PERFLUTREN LIPID MICROSPHERE
1.0000 mL | INTRAVENOUS | Status: AC | PRN
  Administered 2024-03-12: 2 mL via INTRAVENOUS

## 2024-03-12 MED ORDER — SODIUM CHLORIDE 0.9 % IV SOLN
500.0000 mg | INTRAVENOUS | Status: DC
  Administered 2024-03-12 – 2024-03-15 (×4): 500 mg via INTRAVENOUS
  Filled 2024-03-12 (×4): qty 5

## 2024-03-12 MED ORDER — METHYLPREDNISOLONE SODIUM SUCC 40 MG IJ SOLR
40.0000 mg | Freq: Two times a day (BID) | INTRAMUSCULAR | Status: DC
  Administered 2024-03-12 – 2024-03-15 (×7): 40 mg via INTRAVENOUS
  Filled 2024-03-12 (×8): qty 1

## 2024-03-12 MED ORDER — SODIUM CHLORIDE 0.9 % IV SOLN
500.0000 mg | INTRAVENOUS | Status: DC
  Filled 2024-03-12: qty 5

## 2024-03-12 MED ORDER — ALBUTEROL SULFATE (2.5 MG/3ML) 0.083% IN NEBU
2.5000 mg | INHALATION_SOLUTION | RESPIRATORY_TRACT | Status: DC | PRN
  Administered 2024-03-16: 2.5 mg via RESPIRATORY_TRACT
  Filled 2024-03-12: qty 3

## 2024-03-12 MED ORDER — ENOXAPARIN SODIUM 40 MG/0.4ML IJ SOSY
40.0000 mg | PREFILLED_SYRINGE | INTRAMUSCULAR | Status: DC
  Administered 2024-03-12 – 2024-03-17 (×6): 40 mg via SUBCUTANEOUS
  Filled 2024-03-12 (×6): qty 0.4

## 2024-03-12 MED ORDER — IPRATROPIUM-ALBUTEROL 0.5-2.5 (3) MG/3ML IN SOLN
3.0000 mL | Freq: Three times a day (TID) | RESPIRATORY_TRACT | Status: DC
  Administered 2024-03-12 – 2024-03-16 (×13): 3 mL via RESPIRATORY_TRACT
  Filled 2024-03-12 (×14): qty 3

## 2024-03-12 MED ORDER — SODIUM CHLORIDE 0.9 % IV SOLN: INTRAVENOUS | Status: DC

## 2024-03-12 NOTE — Progress Notes (Signed)
  Echocardiogram 2D Echocardiogram has been performed.  Rosemary Holms, RDCS 03/12/2024, 12:23 PM

## 2024-03-12 NOTE — Progress Notes (Addendum)
 Pt placed on Home CPAP unit at this time with 4L O2 Bleed in.   Will continue to monitor pt.    03/12/24 2215  BiPAP/CPAP/SIPAP  BiPAP/CPAP/SIPAP Pt Type Adult  BiPAP/CPAP/SIPAP  (Home Unit)  Patient Home Machine Yes  Safety Check Completed by RT for Home Unit Yes, no issues noted  Patient Home Mask Yes  Patient Home Tubing Yes  CPAP/SIPAP surface wiped down Yes  Device Plugged into RED Power Outlet Yes  BiPAP/CPAP /SiPAP Vitals  Resp (!) 25  MEWS Score/Color  MEWS Score 2  MEWS Score Color Yellow

## 2024-03-12 NOTE — H&P (Signed)
 History and Physical    Patient: Virginia Camacho EAV:[id number] DOB: 28-Jun-1981 DOA: 03/11/2024 DOS: the patient was seen and examined on 03/12/2024 PCP: Berniece Salines, FNP  Patient coming from: Home  Chief Complaint:  Chief Complaint  Patient presents with   Shortness of Breath   HPI: Virginia Camacho is a 43 y.o. female with medical history significant of asthma, morbid obesity, chronic back pain, allergic rhinitis.  Chronic migraine headaches who was sent over from [facility] where she went with shortness of breath and cough.  Patient was hypoxic with oxygen sats 70% on room air.  CTA was done and chest x-ray showing infection with superimposed edema not excluded.  Patient also has some wheezing consistent with possible asthma exacerbation.  She denied any sick contact.  Viral screen is also negative.  Patient appears to have community-acquired pneumonia at this point.  She is being admitted for further evaluation and treatment.  Review of Systems: As mentioned in the history of present illness. All other systems reviewed and are negative. Past Medical History:  Diagnosis Date   Allergy    Asthma    Back pain    Emphysema lung (HCC)    Morbid obesity with BMI of 40.0-44.9, adult (HCC) 03/20/2016   Past Surgical History:  Procedure Laterality Date   KNEE SURGERY Right    Social History:  reports that she quit smoking about 3 years ago. Her smoking use included cigarettes. She started smoking about 23 years ago. She has a 5 pack-year smoking history. She has never used smokeless tobacco. She reports current alcohol use. She reports current drug use. Frequency: 1.00 time per week. Drug: Marijuana.  Allergies  Allergen Reactions   Clarithromycin Other (See Comments) and Nausea Only    Hot   Oxycodone Hives    Family History  Problem Relation Age of Onset   Heart disease Mother    Diabetes Mother    Breast cancer Maternal Grandmother    Heart disease Maternal  Grandfather     Prior to Admission medications   Medication Sig Start Date End Date Taking? Authorizing Provider  acetaminophen (TYLENOL) 650 MG CR tablet Take 650 mg by mouth every 8 (eight) hours as needed.    [provider]  albuterol (PROVENTIL) (2.5 MG/3ML) 0.083% nebulizer solution Take 3 mLs (2.5 mg total) by nebulization every 6 (six) hours as needed for wheezing or shortness of breath. 03/11/24   Berniece Salines, FNP  albuterol (VENTOLIN HFA) 108 (90 Base) MCG/ACT inhaler Inhale 2 puffs into the lungs every 6 (six) hours as needed for wheezing or shortness of breath. 02/12/24   Berniece Salines, FNP  azithromycin (ZITHROMAX) 250 MG tablet Take 2 tablets on day 1, then 1 tablet daily on days 2 through 5 03/11/24 03/16/24  Berniece Salines, FNP  Iron, Ferrous Sulfate, 325 (65 Fe) MG TABS Take 325 mg by mouth daily. 11/17/23   Berniece Salines, FNP  meclizine (ANTIVERT) 25 MG tablet Take 1 tablet (25 mg total) by mouth 3 (three) times daily as needed for dizziness. 06/17/22   Berniece Salines, FNP  metoprolol tartrate (LOPRESSOR) 100 MG tablet Take 1 tablet (100 mg total) by mouth once for 1 dose. TWO HOURS PRIOR TO CARDIAC CTA 06/18/23 06/18/23  Debbe Odea, MD  mometasone (ELOCON) 0.1 % cream Apply to affected area once daily or as needed. 06/14/22   Berniece Salines, FNP  omeprazole (PRILOSEC) 20 MG capsule Take 1 capsule (  20 mg total) by mouth daily. 04/21/23   Berniece Salines, FNP  predniSONE (DELTASONE) 20 MG tablet Take 2 tablets (40 mg total) by mouth daily with breakfast for 5 days. 03/11/24 03/16/24  Berniece Salines, FNP  promethazine-dextromethorphan (PROMETHAZINE-DM) 6.25-15 MG/5ML syrup Take 5 mLs by mouth 4 (four) times daily as needed for cough. 10/22/23   Berniece Salines, FNP  Tiotropium Bromide Monohydrate (SPIRIVA RESPIMAT) 2.5 MCG/ACT AERS Inhale 2 puffs into the lungs daily. 02/25/24   Raechel Chute, MD  [facility] INHUB 100-50 MCG/ACT AEPB INHALE 1 DOSE BY MOUTH TWICE A DAY 01/30/24    Berniece Salines, FNP    Physical Exam: Vitals:   03/11/24 2245 03/11/24 2300 03/11/24 2352 03/12/24 0042  BP:  (!) 158/103 (!) 145/116   Pulse: (!) 108 (!) 110 (!) 117   Resp: 18 (!) 27 20   Temp:  98 F (36.7 C) 97.8 F (36.6 C)   TempSrc:  Oral Oral   SpO2: 92% 90% 91% 92%   Constitutional: Acutely ill looking, obese NAD, calm, comfortable Eyes: PERRL, lids and conjunctivae normal ENMT: Mucous membranes are moist. Posterior pharynx clear of any exudate or lesions.Normal dentition.  Neck: normal, supple, no masses, no thyromegaly Respiratory: Decreased air entry bilaterally with some expiratory wheezing, no crackles. Normal respiratory effort. No accessory muscle use.  Cardiovascular: Sinus tachycardia, no murmurs / rubs / gallops. No extremity edema. 2+ pedal pulses. No carotid bruits.  Abdomen: no tenderness, no masses palpated. No hepatosplenomegaly. Bowel sounds positive.  Musculoskeletal: Good range of motion, no joint swelling or tenderness, Skin: no rashes, lesions, ulcers. No induration Neurologic: CN 2-12 grossly intact. Sensation intact, DTR normal. Strength 5/5 in all 4.  Psychiatric: Normal judgment and insight. Alert and oriented x 3.  Anxious mood  Data Reviewed:  Temperature 98.1 blood pressure 190/99, heart rate 129 and oxygen sat 78% on room air currently on 4 L 92%.  Hemoglobin 16.3 white count 6.3.  CO2 of 19 potassium 5.3 troponin 21 and BNP was 65.  Acute viral screen is negative.  Blood cultures obtained.  Chest x-ray showed no active disease but CT angiogram of the chest showed diffuse interstitial infiltrates more extensive consolidation within the right lower lobe most in keeping with acute infection.  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  Assessment and Plan:  #1 community-acquired pneumonia: Patient  will be admitted.  Initiate IV antibiotics, breathing treatments and other supportive care.  Continue to monitor closely  #2 acute hypoxic respiratory failure: Secondary to pneumonia and acute asthma exacerbation.  Continue treatment as above  #3 acute exacerbation of asthma: Continue with breathing treatment and add steroids.  Continue to monitor  #4 morbid obesity: Continue to monitor  #5 hyperlipidemia: Continue with statin.  #6 anxiety disorder: Continue antianxiety medications from  #7 major depressive disorder: Continue antidepressant    Advance Care Planning:   Code Status: Full Code   Consults: None  Family Communication: No family at bedside  Severity of Illness: The appropriate patient status for this patient is INPATIENT. Inpatient status is judged to be reasonable and necessary in order to provide the required intensity of service to ensure the patient's safety. The patient's presenting symptoms, physical exam findings, and initial radiographic and laboratory data in the context of their chronic comorbidities is felt to place them at high risk for further clinical  deterioration. Furthermore, it is not anticipated that the patient will be medically stable for discharge from the hospital within 2 midnights of admission.   * I certify that at the point of admission it is my clinical judgment that the patient will require inpatient hospital care spanning beyond 2 midnights from the point of admission due to high intensity of service, high risk for further deterioration and high frequency of surveillance required.*  AuthorLonia Blood, MD 03/12/2024 12:59 AM  For on call review [email or web address].

## 2024-03-12 NOTE — Progress Notes (Signed)
 Patient admitted after midnight, please see H&P.  A/P: community-acquired pneumonia:  - IV antibiotics -breathing treatments  - Continue to monitor closely   Elevated BNP -echo (was supposed to be done last year but never done) -may need IV lasix   acute hypoxic respiratory failure:  -Secondary to pneumonia and acute asthma exacerbation.   -wean to RA   acute exacerbation of asthma:  -Continue with breathing treatment  - steroids.    morbid obesity:  Estimated body mass index is 42.62 kg/m as calculated from the following:   Height as of an earlier encounter on 03/11/24: 5\' 3"  (1.6 m).   Weight as of an earlier encounter on 03/11/24: 109.1 kg.     hyperlipidemia: Continue with statin.    anxiety disorder: Continue antianxiety medications from    major depressive disorder: Continue antidepressant    Marlin Canary DO

## 2024-03-12 NOTE — Progress Notes (Signed)
   03/12/24 0310  BiPAP/CPAP/SIPAP  BiPAP/CPAP/SIPAP Pt Type Adult (Patient is using her home equipment)  BiPAP/CPAP/SIPAP  (Home Machine)  Mask Type Full face mask  Dentures removed? Not applicable  Flow Rate 4 lpm (bled into circuit)  Heater Temperature  (sterile water added to humidifier)  Patient Home Machine Yes  Safety Check Completed by RT for Home Unit Yes, no issues noted  Patient Home Mask Yes  Patient Home Tubing Yes  CPAP/SIPAP surface wiped down Yes  Device Plugged into RED Power Outlet Yes  BiPAP/CPAP /SiPAP Vitals  Resp 11  MEWS Score/Color  MEWS Score 3  MEWS Score Color Yellow

## 2024-03-12 NOTE — Plan of Care (Signed)

## 2024-03-13 DIAGNOSIS — J441 Chronic obstructive pulmonary disease with (acute) exacerbation: Secondary | ICD-10-CM | POA: Diagnosis not present

## 2024-03-13 LAB — CBC
HCT: 53.2 % — ABNORMAL HIGH (ref 36.0–46.0)
Hemoglobin: 16.1 g/dL — ABNORMAL HIGH (ref 12.0–15.0)
MCH: 24.3 pg — ABNORMAL LOW (ref 26.0–34.0)
MCHC: 30.3 g/dL (ref 30.0–36.0)
MCV: 80.2 fL (ref 80.0–100.0)
Platelets: 308 10*3/uL (ref 150–400)
RBC: 6.63 MIL/uL — ABNORMAL HIGH (ref 3.87–5.11)
RDW: 17.6 % — ABNORMAL HIGH (ref 11.5–15.5)
WBC: 12.5 10*3/uL — ABNORMAL HIGH (ref 4.0–10.5)
nRBC: 0 % (ref 0.0–0.2)

## 2024-03-13 LAB — BASIC METABOLIC PANEL WITH GFR
Anion gap: 13 (ref 5–15)
BUN: 21 mg/dL — ABNORMAL HIGH (ref 6–20)
CO2: 18 mmol/L — ABNORMAL LOW (ref 22–32)
Calcium: 9.6 mg/dL (ref 8.9–10.3)
Chloride: 107 mmol/L (ref 98–111)
Creatinine, Ser: 1.01 mg/dL — ABNORMAL HIGH (ref 0.44–1.00)
GFR, Estimated: >60 mL/min (ref >60)
Glucose, Bld: 111 mg/dL — ABNORMAL HIGH (ref 70–99)
Potassium: 4.9 mmol/L (ref 3.5–5.1)
Sodium: 138 mmol/L (ref 135–145)

## 2024-03-13 MED ORDER — FUROSEMIDE 10 MG/ML IJ SOLN
20.0000 mg | Freq: Once | INTRAMUSCULAR | Status: AC
  Administered 2024-03-13: 20 mg via INTRAVENOUS
  Filled 2024-03-13: qty 2

## 2024-03-13 NOTE — Progress Notes (Signed)
 PROGRESS NOTE    [name]  VHQ:[id number] DOB: 1981/01/24 DOA: 03/11/2024 PCP: Berniece Salines, FNP    Brief Narrative:  Virginia Camacho is a 43 y.o. female with medical history significant of asthma, morbid obesity, chronic back pain, allergic rhinitis.  Chronic migraine headaches who was sent over from [facility] where she went with shortness of breath and cough.  Patient was hypoxic with oxygen sats 70% on room air.  CTA was done and chest x-ray showing infection with superimposed edema not excluded.  Patient also has some wheezing consistent with possible asthma exacerbation.  She denied any sick contact.  Viral screen is also negative.  Patient appears to have community-acquired pneumonia at this point.  She is being admitted for further evaluation and treatment.   Assessment and Plan: community-acquired pneumonia:  - IV antibiotics -breathing treatments  - Continue to monitor closely   Acute diastolic CHF with Elevated BNP -echo (was supposed to be done last year but never done)-- D-shaped septum suggestive of RV pressure/volume overload. Right  ventricular systolic function is moderately reduced.  -IV lasix -cards follow up    acute hypoxic respiratory failure:  -Secondary to pneumonia and acute asthma exacerbation.   -wean to RA   acute exacerbation of asthma:  -Continue with breathing treatment  - steroids.    morbid obesity:  Estimated body mass index is 42.62 kg/m as calculated from the following:   Height as of an earlier encounter on 03/11/24: 5\' 3"  (1.6 m).   Weight as of an earlier encounter on 03/11/24: 109.1 kg.     hyperlipidemia: Continue with statin.    anxiety disorder: Continue antianxiety medications from    major depressive disorder: Continue antidepressant      DVT prophylaxis: enoxaparin (LOVENOX) injection 40 mg Start: 03/12/24 1000    Code Status: Full Code Family Communication:   Disposition Plan:  Level of care:  Progressive Status is: Inpatient     Consultants:  none   Subjective: Breathing improved and less ankle edema  Objective: Vitals:   03/13/24 0104 03/13/24 0457 03/13/24 0824 03/13/24 0929  BP: (!) 140/92 111/88  124/75  Pulse: 95 92  91  Resp: (!) 22 20    Temp: 97.8 F (36.6 C) 97.8 F (36.6 C)  (!) 97.5 F (36.4 C)  TempSrc: Oral Oral  Oral  SpO2: 91% 94% 93% 91%    Intake/Output Summary (Last 24 hours) at 03/13/2024 1214 Last data filed at 03/13/2024 1133 Gross per 24 hour  Intake 360.13 ml  Output 2150 ml  Net -1789.87 ml   There were no vitals filed for this visit.  Examination:   General: Appearance:    Severely obese female in no acute distress     Lungs:     respirations unlabored  Heart:    Normal heart rate. .    MS:   All extremities are intact.    Neurologic:   Awake, alert       Data Reviewed: I have personally reviewed following labs and imaging studies  CBC: Recent Labs  Lab 03/11/24 1751 03/12/24 0405 03/13/24 0406  WBC 6.3 5.7 12.5*  NEUTROABS 5.6 4.8  --   HGB 16.3* 16.3* 16.1*  HCT 51.2* 51.7* 53.2*  MCV 75.7* 77.5* 80.2  PLT 310 318 308   Basic Metabolic Panel: Recent Labs  Lab 03/11/24 1751 03/12/24 0405 03/13/24 0406  NA 135 137 138  K 5.3* 4.0 4.9  CL 103 108 107  CO2 19* 16* 18*  GLUCOSE 98 156* 111*  BUN 16 14 21*  CREATININE 0.97 0.87 1.01*  CALCIUM 9.3 9.2 9.6   GFR: Estimated Creatinine Clearance: 85.1 mL/min (A) (by C-G formula based on SCr of 1.01 mg/dL (H)). Liver Function Tests: Recent Labs  Lab 03/12/24 0405  AST 32  ALT 47*  ALKPHOS 33*  BILITOT 1.4*  PROT 7.2  ALBUMIN 3.5   No results for input(s): "LIPASE", "AMYLASE" in the last 168 hours. No results for input(s): "AMMONIA" in the last 168 hours. Coagulation Profile: No results for input(s): "INR", "PROTIME" in the last 168 hours. Cardiac Enzymes: No results for input(s): "CKTOTAL", "CKMB", "CKMBINDEX", "TROPONINI" in the last 168  hours. BNP (last 3 results) No results for input(s): "PROBNP" in the last 8760 hours. HbA1C: No results for input(s): "HGBA1C" in the last 72 hours. CBG: Recent Labs  Lab 03/12/24 1641  GLUCAP 125*   Lipid Profile: No results for input(s): "CHOL", "HDL", "LDLCALC", "TRIG", "CHOLHDL", "LDLDIRECT" in the last 72 hours. Thyroid Function Tests: No results for input(s): "TSH", "T4TOTAL", "FREET4", "T3FREE", "THYROIDAB" in the last 72 hours. Anemia Panel: No results for input(s): "VITAMINB12", "FOLATE", "FERRITIN", "TIBC", "IRON", "RETICCTPCT" in the last 72 hours. Sepsis Labs: No results for input(s): "PROCALCITON", "LATICACIDVEN" in the last 168 hours.  Recent Results (from the past 240 hours)  Resp panel by RT-PCR (RSV, Flu A&B, Covid) Anterior Nasal Swab     Status: None   Collection Time: 03/11/24  5:50 PM   Specimen: Anterior Nasal Swab  Result Value Ref Range Status   SARS Coronavirus 2 by RT PCR NEGATIVE NEGATIVE Final    Comment: (NOTE) SARS-CoV-2 target nucleic acids are NOT DETECTED.  The SARS-CoV-2 RNA is generally detectable in upper respiratory specimens during the acute phase of infection. The lowest concentration of SARS-CoV-2 viral copies this assay can detect is 138 copies/mL. A negative result does not preclude SARS-Cov-2 infection and should not be used as the sole basis for treatment or other patient management decisions. A negative result may occur with  improper specimen collection/handling, submission of specimen other than nasopharyngeal swab, presence of viral mutation(s) within the areas targeted by this assay, and inadequate number of viral copies(<138 copies/mL). A negative result must be combined with clinical observations, patient history, and epidemiological information. The expected result is Negative.  Fact Sheet for Patients:  BloggerCourse.com  Fact Sheet for Healthcare Providers:   SeriousBroker.it  This test is no t yet approved or cleared by the [place] FDA and  has been authorized for detection and/or diagnosis of SARS-CoV-2 by FDA under an Emergency Use Authorization (EUA). This EUA will remain  in effect (meaning this test can be used) for the duration of the COVID-19 declaration under Section 564(b)(1) of the Act, 21 U.S.C.section 360bbb-3(b)(1), unless the authorization is terminated  or revoked sooner.       Influenza A by PCR NEGATIVE NEGATIVE Final   Influenza B by PCR NEGATIVE NEGATIVE Final    Comment: (NOTE) The Xpert Xpress SARS-CoV-2/FLU/RSV plus assay is intended as an aid in the diagnosis of influenza from Nasopharyngeal swab specimens and should not be used as a sole basis for treatment. Nasal washings and aspirates are unacceptable for Xpert Xpress SARS-CoV-2/FLU/RSV testing.  Fact Sheet for Patients: BloggerCourse.com  Fact Sheet for Healthcare Providers: SeriousBroker.it  This test is not yet approved or cleared by the [place] FDA and has been authorized for detection and/or diagnosis of SARS-CoV-2 by FDA under an Emergency  Use Authorization (EUA). This EUA will remain in effect (meaning this test can be used) for the duration of the COVID-19 declaration under Section 564(b)(1) of the Act, 21 U.S.C. section 360bbb-3(b)(1), unless the authorization is terminated or revoked.     Resp Syncytial Virus by PCR NEGATIVE NEGATIVE Final    Comment: (NOTE) Fact Sheet for Patients: BloggerCourse.com  Fact Sheet for Healthcare Providers: SeriousBroker.it  This test is not yet approved or cleared by the [place] FDA and has been authorized for detection and/or diagnosis of SARS-CoV-2 by FDA under an Emergency Use Authorization (EUA). This EUA will remain in effect (meaning this test can be used) for  the duration of the COVID-19 declaration under Section 564(b)(1) of the Act, 21 U.S.C. section 360bbb-3(b)(1), unless the authorization is terminated or revoked.  Performed at [facility], [address] [place], [place] [address]   Blood culture (routine x 2)     Status: None (Preliminary result)   Collection Time: 03/11/24  6:00 PM   Specimen: BLOOD RIGHT FOREARM  Result Value Ref Range Status   Specimen Description   Final    BLOOD RIGHT FOREARM Performed at [facility], [address], [place], [place] [address]    Special Requests   Final    BOTTLES DRAWN AEROBIC AND ANAEROBIC Blood Culture adequate volume Performed at [facility], [address] [place], [place] [address]    Culture   Final    NO GROWTH < 24 HOURS Performed at [facility], [address], [place], [place] [address]    Report Status PENDING  Incomplete  Blood culture (routine x 2)     Status: None (Preliminary result)   Collection Time: 03/11/24  9:45 PM   Specimen: BLOOD  Result Value Ref Range Status   Specimen Description   Final    BLOOD LEFT ANTECUBITAL Performed at [facility], [address] [place], [place] [address]    Special Requests   Final    BOTTLES DRAWN AEROBIC AND ANAEROBIC Blood Culture adequate volume Performed at [facility], [address] [place], [place] [address]    Culture   Final    NO GROWTH < 24 HOURS Performed at [facility], [address] [place], [place] [address]    Report Status PENDING  Incomplete         Radiology Studies: ECHOCARDIOGRAM COMPLETE Result Date: 03/12/2024    ECHOCARDIOGRAM REPORT   Patient Name:   Virginia Camacho Date of Exam: 03/12/2024 Medical Rec #:  [id number]        Height:       63.0 in Accession #:    [id number]       Weight:       240.6 lb Date of Birth:  [birth year]        BSA:          2.092 m Patient Age:    42 years          BP:           147/89 mmHg Patient Gender: F                HR:           109 bpm. Exam Location:  Inpatient Procedure: 2D Echo, Cardiac Doppler and Color Doppler (Both Spectral and Color            Flow Doppler were utilized during procedure). Indications:    I50.31  Acute diastolic (congestive) heart failure  History:        Patient has no prior history of Echocardiogram examinations.                 Signs/Symptoms:Shortness of Breath.  Sonographer:    Maxwell Marion Referring Phys: [phone] Lido Maske U Brindle Leyba IMPRESSIONS  1. Left ventricular ejection fraction, by estimation, is 55%. The left ventricle has normal function. The left ventricle has no regional wall motion abnormalities. Indeterminate diastolic filling due to E-A fusion.  2. D-shaped septum suggestive of RV pressure/volume overload. Right ventricular systolic function is moderately reduced. The right ventricular size is severely enlarged. There is moderately elevated pulmonary artery systolic pressure. The estimated right ventricular systolic pressure is 52.1 mmHg.  3. Right atrial size was mildly dilated.  4. The mitral valve is normal in structure. No evidence of mitral valve regurgitation. No evidence of mitral stenosis.  5. The aortic valve is tricuspid. Aortic valve regurgitation is not visualized. No aortic stenosis is present.  6. The inferior vena cava is dilated in size with >50% respiratory variability, suggesting right atrial pressure of 8 mmHg. FINDINGS  Left Ventricle: Left ventricular ejection fraction, by estimation, is 55%. The left ventricle has normal function. The left ventricle has no regional wall motion abnormalities. Definity contrast agent was given IV to delineate the left ventricular endocardial borders. The left ventricular internal cavity size was normal in size. There is no left ventricular hypertrophy. Indeterminate diastolic filling due to E-A fusion. Right Ventricle: D-shaped septum suggestive of RV pressure/volume overload.  The right ventricular size is severely enlarged. No increase in right ventricular wall thickness. Right ventricular systolic function is moderately reduced. There is moderately elevated pulmonary artery systolic pressure. The tricuspid regurgitant velocity is 3.32 m/s, and with an assumed right atrial pressure of 8 mmHg, the estimated right ventricular systolic pressure is 52.1 mmHg. Left Atrium: Left atrial size was normal in size. Right Atrium: Right atrial size was mildly dilated. Pericardium: There is no evidence of pericardial effusion. Mitral Valve: The mitral valve is normal in structure. No evidence of mitral valve regurgitation. No evidence of mitral valve stenosis. Tricuspid Valve: The tricuspid valve is normal in structure. Tricuspid valve regurgitation is mild. Aortic Valve: The aortic valve is tricuspid. Aortic valve regurgitation is not visualized. No aortic stenosis is present. Aortic valve mean gradient measures 2.0 mmHg. Aortic valve peak gradient measures 3.9 mmHg. Aortic valve area, by VTI measures 2.24 cm. Pulmonic Valve: The pulmonic valve was normal in structure. Pulmonic valve regurgitation is trivial. Aorta: The aortic root is normal in size and structure. Venous: The inferior vena cava is dilated in size with greater than 50% respiratory variability, suggesting right atrial pressure of 8 mmHg. IAS/Shunts: No atrial level shunt detected by color flow Doppler.  LEFT VENTRICLE PLAX 2D LVIDd:         3.80 cm     Diastology LVIDs:         2.80 cm     LV e' medial: 13.70 cm/s LV PW:         1.10 cm LV IVS:        1.20 cm LVOT diam:     1.90 cm LV SV:         34 LV SV Index:   16 LVOT Area:     2.84 cm  LV Volumes (MOD) LV vol d, MOD A2C: 92.7 ml LV vol d, MOD A4C: 74.4 ml LV vol s, MOD A2C: 39.9 ml  LV vol s, MOD A4C: 41.6 ml LV SV MOD A2C:     52.8 ml LV SV MOD A4C:     74.4 ml LV SV MOD BP:      42.5 ml RIGHT VENTRICLE RV S prime:     8.81 cm/s TAPSE (M-mode): 1.6 cm LEFT ATRIUM              Index LA Vol (A2C):   29.0 ml 13.86 ml/m LA Vol (A4C):   14.6 ml 6.98 ml/m LA Biplane Vol: 21.6 ml 10.33 ml/m  AORTIC VALVE AV Area (Vmax):    2.29 cm AV Area (Vmean):   1.94 cm AV Area (VTI):     2.24 cm AV Vmax:           99.10 cm/s AV Vmean:          68.600 cm/s AV VTI:            0.152 m AV Peak Grad:      3.9 mmHg AV Mean Grad:      2.0 mmHg LVOT Vmax:         80.20 cm/s LVOT Vmean:        46.900 cm/s LVOT VTI:          0.120 m LVOT/AV VTI ratio: 0.79  AORTA Ao Asc diam: 3.50 cm TRICUSPID VALVE TR Peak grad:   44.1 mmHg TR Vmax:        332.00 cm/s  SHUNTS Systemic VTI:  0.12 m Systemic Diam: 1.90 cm Dalton McleanMD Electronically signed by Wilfred Lacy Signature Date/Time: 03/12/2024/12:31:55 PM    Final    CT Angio Chest PE W and/or Wo Contrast Result Date: 03/11/2024 CLINICAL DATA:  Letter intermediate probably pulmonary embolism, positive D-dimer, dyspnea, hypoxia EXAM: CT ANGIOGRAPHY CHEST WITH CONTRAST TECHNIQUE: Multidetector CT imaging of the chest was performed using the standard protocol during bolus administration of intravenous contrast. Multiplanar CT image reconstructions and MIPs were obtained to evaluate the vascular anatomy. RADIATION DOSE REDUCTION: This exam was performed according to the departmental dose-optimization program which includes automated exposure control, adjustment of the mA and/or kV according to patient size and/or use of iterative reconstruction technique. CONTRAST:  OMNIPAQUE IOHEXOL 350 MG/ML SOLN COMPARISON:  02/23/2024 FINDINGS: Cardiovascular: Adequate opacification of the pulmonary arterial tree. No intraluminal filling defect identified to suggest acute pulmonary embolism. Central pulmonary arteries are of normal caliber. No significant coronary artery calcification. Cardiac size within normal limits. No pericardial effusion. No significant atherosclerotic calcification within the thoracic aorta. No aortic aneurysm. Mediastinum/Nodes: Stable  retrocardiac, paraesophageal low-attenuation mass measuring at least 5.0 x 8.5 x 10.3 cm in greatest dimension. This is not well characterized on this examination due to phase of contrast enhancement. As noted previously, this may represent a cystic collection such as a foregut duplication cyst though a esophageal mass such as a leiomyoma or lymphadenopathy is not completely excluded on this limited examination. Visualized thyroid is unremarkable. The esophagus is otherwise unremarkable. Lungs/Pleura: Severe centrilobular emphysema is again identified. There has developed superimposed diffuse interstitial infiltrate with more extensive consolidation within the right lower lobe most in keeping with acute infection. A component of superimposed edema is difficult to exclude. No pneumothorax or pleural effusion. No central obstructing lesion. Upper Abdomen: No acute abnormality. Musculoskeletal: No chest wall abnormality. No acute or significant osseous findings. Review of the MIP images confirms the above findings. IMPRESSION: 1. No pulmonary embolism. 2. Interval development of diffuse interstitial infiltrate with more extensive consolidation within the  right lower lobe most in keeping with acute infection. A component of superimposed edema is difficult to exclude. 3. Stable retrocardiac, paraesophageal low-attenuation mass measuring at least 5.0 x 8.5 x 10.3 cm in greatest dimension. This is not well characterized on this examination due to phase of contrast enhancement. As noted previously, this may represent a cystic collection such as a foregut duplication cyst though a esophageal mass such as a leiomyoma or lymphadenopathy is not completely excluded on this limited examination. If indicated, this could be better assessed with contrast enhanced MRI examination or endoscopic ultrasound. Emphysema (ICD10-J43.9). Electronically Signed   By: Helyn Numbers M.D.   On: 03/11/2024 20:00   DG Chest Portable 1  View Result Date: 03/11/2024 CLINICAL DATA:  Shortness of breath EXAM: PORTABLE CHEST 1 VIEW COMPARISON:  Chest x-ray 02/09/2024.  Chest CT 02/23/2024. FINDINGS: Heart size is within normal limits. Lower mediastinal masslike opacity appears unchanged from prior CT. Diffuse emphysematous changes are again noted. Both lungs are clear. The visualized skeletal structures are unremarkable. IMPRESSION: No active disease. Electronically Signed   By: Darliss Cheney M.D.   On: 03/11/2024 18:31        Scheduled Meds:  enoxaparin (LOVENOX) injection  40 mg Subcutaneous Q24H   ipratropium-albuterol  3 mL Nebulization TID   methylPREDNISolone (SOLU-MEDROL) injection  40 mg Intravenous Q12H   Continuous Infusions:  azithromycin 500 mg (03/13/24 0953)   cefTRIAXone (ROCEPHIN)  IV 2 g (03/13/24 0916)     LOS: 1 day    Time spent: 45 minutes spent on chart review, discussion with nursing staff, consultants, updating family and interview/physical exam; more than 50% of that time was spent in counseling and/or coordination of care.    Joseph Art, DO [facility] Available via Epic secure chat 7am-7pm After these hours, please refer to coverage provider listed on [email or web address] 03/13/2024, 12:14 PM

## 2024-03-14 DIAGNOSIS — E8779 Other fluid overload: Secondary | ICD-10-CM | POA: Diagnosis not present

## 2024-03-14 LAB — BASIC METABOLIC PANEL WITH GFR
Anion gap: 9 (ref 5–15)
BUN: 27 mg/dL — ABNORMAL HIGH (ref 6–20)
CO2: 22 mmol/L (ref 22–32)
Calcium: 9 mg/dL (ref 8.9–10.3)
Chloride: 106 mmol/L (ref 98–111)
Creatinine, Ser: 0.99 mg/dL (ref 0.44–1.00)
GFR, Estimated: >60 mL/min (ref >60)
Glucose, Bld: 116 mg/dL — ABNORMAL HIGH (ref 70–99)
Potassium: 5 mmol/L (ref 3.5–5.1)
Sodium: 137 mmol/L (ref 135–145)

## 2024-03-14 MED ORDER — FUROSEMIDE 10 MG/ML IJ SOLN
20.0000 mg | Freq: Two times a day (BID) | INTRAMUSCULAR | Status: DC
  Administered 2024-03-14 – 2024-03-17 (×6): 20 mg via INTRAVENOUS
  Filled 2024-03-14 (×6): qty 2

## 2024-03-14 MED ORDER — FUROSEMIDE 10 MG/ML IJ SOLN
20.0000 mg | Freq: Once | INTRAMUSCULAR | Status: AC
  Administered 2024-03-14: 20 mg via INTRAVENOUS
  Filled 2024-03-14: qty 2

## 2024-03-14 NOTE — Progress Notes (Signed)
   03/14/24 2057  BiPAP/CPAP/SIPAP  BiPAP/CPAP/SIPAP Pt Type Adult  Heater Temperature  (sterile water added to home unit)  Safety Check Completed by RT for Home Unit Yes, no issues noted

## 2024-03-14 NOTE — Progress Notes (Signed)
 PROGRESS NOTE    Virginia Camacho  HYQ:[id number] DOB: 02-02-1981 DOA: 03/11/2024 PCP: Berniece Salines, FNP    Brief Narrative:  Virginia Camacho is a 43 y.o. female with medical history significant of asthma, morbid obesity, chronic back pain, allergic rhinitis.  Chronic migraine headaches who was sent over from [facility] where she went with shortness of breath and cough.  Patient was hypoxic with oxygen sats 70% on room air.  CTA was done and chest x-ray showing infection with superimposed edema not excluded.  Patient also has some wheezing consistent with possible asthma exacerbation.  She denied any sick contact.  Viral screen is also negative.  Patient appears to have community-acquired pneumonia at this point.  She is being admitted for further evaluation and treatment.   Assessment and Plan:   acute hypoxic respiratory failure:  -Secondary to ? PNA/COPD but more likely volume overlad  -wean to RA as able   ? community-acquired pneumonia:  - IV antibiotics -breathing treatments    Acute diastolic CHF with Elevated BNP -echo (was supposed to be done last year but never done)-- D-shaped septum suggestive of RV pressure/volume overload. Right ventricular systolic function is moderately reduced.  -IV lasix -daily weight Strict I/O -cards follow up outpatient     acute exacerbation of asthma:  -Continue with breathing treatment  - steroids- wean to off    morbid obesity:  Estimated body mass index is 42.62 kg/m as calculated from the following:   Height as of an earlier encounter on 03/11/24: 5\' 3"  (1.6 m).   Weight as of an earlier encounter on 03/11/24: 109.1 kg.     hyperlipidemia: Continue with statin.    anxiety disorder: Continue antianxiety medications from    major depressive disorder: Continue antidepressant   OSA -cpap    DVT prophylaxis: enoxaparin (LOVENOX) injection 40 mg Start: 03/12/24 1000    Code Status: Full Code Family Communication:  at bedside  Disposition Plan:  Level of care: Progressive Status is: Inpatient     Consultants:  none   Subjective: Still with swelling but improved  Objective: Vitals:   03/14/24 0054 03/14/24 0451 03/14/24 0500 03/14/24 0918  BP:  (!) 131/93    Pulse:  87    Resp: (!) 22 20  (!) 22  Temp:  97.9 F (36.6 C)    TempSrc:  Oral    SpO2:  92%  92%  Weight:   109.9 kg     Intake/Output Summary (Last 24 hours) at 03/14/2024 1306 Last data filed at 03/14/2024 1147 Gross per 24 hour  Intake 350 ml  Output 4800 ml  Net -4450 ml   Filed Weights   03/14/24 0500  Weight: 109.9 kg    Examination:    General: Appearance:    Severely obese female in no acute distress     Lungs:      respirations unlabored, diminished  Heart:    Normal heart rate.   MS:   All extremities are intact.   Neurologic:   Awake, alert      Data Reviewed: I have personally reviewed following labs and imaging studies  CBC: Recent Labs  Lab 03/11/24 1751 03/12/24 0405 03/13/24 0406  WBC 6.3 5.7 12.5*  NEUTROABS 5.6 4.8  --   HGB 16.3* 16.3* 16.1*  HCT 51.2* 51.7* 53.2*  MCV 75.7* 77.5* 80.2  PLT 310 318 308   Basic Metabolic Panel: Recent Labs  Lab 03/11/24 1751 03/12/24 0405 03/13/24 0406  03/14/24 0404  NA 135 137 138 137  K 5.3* 4.0 4.9 5.0  CL 103 108 107 106  CO2 19* 16* 18* 22  GLUCOSE 98 156* 111* 116*  BUN 16 14 21* 27*  CREATININE 0.97 0.87 1.01* 0.99  CALCIUM 9.3 9.2 9.6 9.0   GFR: Estimated Creatinine Clearance: 87.2 mL/min (by C-G formula based on SCr of 0.99 mg/dL). Liver Function Tests: Recent Labs  Lab 03/12/24 0405  AST 32  ALT 47*  ALKPHOS 33*  BILITOT 1.4*  PROT 7.2  ALBUMIN 3.5   No results for input(s): "LIPASE", "AMYLASE" in the last 168 hours. No results for input(s): "AMMONIA" in the last 168 hours. Coagulation Profile: No results for input(s): "INR", "PROTIME" in the last 168 hours. Cardiac Enzymes: No results for input(s): "CKTOTAL",  "CKMB", "CKMBINDEX", "TROPONINI" in the last 168 hours. BNP (last 3 results) No results for input(s): "PROBNP" in the last 8760 hours. HbA1C: No results for input(s): "HGBA1C" in the last 72 hours. CBG: Recent Labs  Lab 03/12/24 1641  GLUCAP 125*   Lipid Profile: No results for input(s): "CHOL", "HDL", "LDLCALC", "TRIG", "CHOLHDL", "LDLDIRECT" in the last 72 hours. Thyroid Function Tests: No results for input(s): "TSH", "T4TOTAL", "FREET4", "T3FREE", "THYROIDAB" in the last 72 hours. Anemia Panel: No results for input(s): "VITAMINB12", "FOLATE", "FERRITIN", "TIBC", "IRON", "RETICCTPCT" in the last 72 hours. Sepsis Labs: No results for input(s): "PROCALCITON", "LATICACIDVEN" in the last 168 hours.  Recent Results (from the past 240 hours)  Resp panel by RT-PCR (RSV, Flu A&B, Covid) Anterior Nasal Swab     Status: None   Collection Time: 03/11/24  5:50 PM   Specimen: Anterior Nasal Swab  Result Value Ref Range Status   SARS Coronavirus 2 by RT PCR NEGATIVE NEGATIVE Final    Comment: (NOTE) SARS-CoV-2 target nucleic acids are NOT DETECTED.  The SARS-CoV-2 RNA is generally detectable in upper respiratory specimens during the acute phase of infection. The lowest concentration of SARS-CoV-2 viral copies this assay can detect is 138 copies/mL. A negative result does not preclude SARS-Cov-2 infection and should not be used as the sole basis for treatment or other patient management decisions. A negative result may occur with  improper specimen collection/handling, submission of specimen other than nasopharyngeal swab, presence of viral mutation(s) within the areas targeted by this assay, and inadequate number of viral copies(<138 copies/mL). A negative result must be combined with clinical observations, patient history, and epidemiological information. The expected result is Negative.  Fact Sheet for Patients:  BloggerCourse.com  Fact Sheet for Healthcare  Providers:  SeriousBroker.it  This test is no t yet approved or cleared by the [place] FDA and  has been authorized for detection and/or diagnosis of SARS-CoV-2 by FDA under an Emergency Use Authorization (EUA). This EUA will remain  in effect (meaning this test can be used) for the duration of the COVID-19 declaration under Section 564(b)(1) of the Act, 21 U.S.C.section 360bbb-3(b)(1), unless the authorization is terminated  or revoked sooner.       Influenza A by PCR NEGATIVE NEGATIVE Final   Influenza B by PCR NEGATIVE NEGATIVE Final    Comment: (NOTE) The Xpert Xpress SARS-CoV-2/FLU/RSV plus assay is intended as an aid in the diagnosis of influenza from Nasopharyngeal swab specimens and should not be used as a sole basis for treatment. Nasal washings and aspirates are unacceptable for Xpert Xpress SARS-CoV-2/FLU/RSV testing.  Fact Sheet for Patients: BloggerCourse.com  Fact Sheet for Healthcare Providers: SeriousBroker.it  This test is not  yet approved or cleared by the [place] and has been authorized for detection and/or diagnosis of SARS-CoV-2 by FDA under an Emergency Use Authorization (EUA). This EUA will remain in effect (meaning this test can be used) for the duration of the COVID-19 declaration under Section 564(b)(1) of the Act, 21 U.S.C. section 360bbb-3(b)(1), unless the authorization is terminated or revoked.     Resp Syncytial Virus by PCR NEGATIVE NEGATIVE Final    Comment: (NOTE) Fact Sheet for Patients: BloggerCourse.com  Fact Sheet for Healthcare Providers: SeriousBroker.it  This test is not yet approved or cleared by the [place] FDA and has been authorized for detection and/or diagnosis of SARS-CoV-2 by FDA under an Emergency Use Authorization (EUA). This EUA will remain in effect (meaning this test can be  used) for the duration of the COVID-19 declaration under Section 564(b)(1) of the Act, 21 U.S.C. section 360bbb-3(b)(1), unless the authorization is terminated or revoked.  Performed at [facility], [address] [place], [place] [address]   Blood culture (routine x 2)     Status: None (Preliminary result)   Collection Time: 03/11/24  6:00 PM   Specimen: BLOOD RIGHT FOREARM  Result Value Ref Range Status   Specimen Description   Final    BLOOD RIGHT FOREARM Performed at [facility], [address], [place], [place] [address]    Special Requests   Final    BOTTLES DRAWN AEROBIC AND ANAEROBIC Blood Culture adequate volume Performed at [facility], [address] [place], [place] [address]    Culture   Final    NO GROWTH 2 DAYS Performed at [facility], [address], [place], [place] [address]    Report Status PENDING  Incomplete  Blood culture (routine x 2)     Status: None (Preliminary result)   Collection Time: 03/11/24  9:45 PM   Specimen: BLOOD  Result Value Ref Range Status   Specimen Description   Final    BLOOD LEFT ANTECUBITAL Performed at [facility], [address], [place], [place] [address]    Special Requests   Final    BOTTLES DRAWN AEROBIC AND ANAEROBIC Blood Culture adequate volume Performed at [facility], [address] [place], [place] [address]    Culture   Final    NO GROWTH 2 DAYS Performed at [facility], [address], [place], [place] [address]    Report Status PENDING  Incomplete         Radiology Studies: No results found.       Scheduled Meds:  enoxaparin (LOVENOX) injection  40 mg Subcutaneous Q24H   furosemide  20 mg Intravenous Q12H   ipratropium-albuterol  3 mL Nebulization TID   methylPREDNISolone (SOLU-MEDROL) injection  40 mg Intravenous Q12H   Continuous Infusions:  azithromycin 500 mg (03/14/24  1058)   cefTRIAXone (ROCEPHIN)  IV 2 g (03/14/24 1015)     LOS: 2 days    Time spent: 45 minutes spent on chart review, discussion with nursing staff, consultants, updating family and interview/physical exam; more than 50% of that time was spent in counseling and/or coordination of care.    Joseph Art, DO [facility] Available via Epic secure chat 7am-7pm After these hours, please refer to coverage provider listed on [email or web address] 03/14/2024, 1:06 PM

## 2024-03-14 NOTE — Progress Notes (Signed)
 Mobility Specialist - Progress Note  Pre-mobility: 97 bpm HR, 94% SpO2 ([place] 4L) During mobility: 102 bpm HR, 85% SpO2 ([place] 6L) Post-mobility: 98 bpm HR, 91% SPO2 ([place] 4L)   03/14/24 1201  Mobility  Activity Ambulated independently in hallway  Level of Assistance Independent after set-up  Assistive Device None  Distance Ambulated (ft) 90 ft  Range of Motion/Exercises Active  Activity Response Tolerated fair  Mobility Referral Yes  Mobility visit 1 Mobility  Mobility Specialist Start Time (ACUTE ONLY) 1150  Mobility Specialist Stop Time (ACUTE ONLY) 1201  Mobility Specialist Time Calculation (min) (ACUTE ONLY) 11 min   Pt was found sitting EOB and agreeable to ambulate. Per RN ambulated on 6L [place]. Had x1 brief standing rest break due to SPO2 85%. Able to increase >90% within 1 min. At EOS returned to sit EOB with all needs met. Call bell in reach and family in room.  Billey Chang Mobility Specialist

## 2024-03-15 DIAGNOSIS — E8770 Fluid overload, unspecified: Secondary | ICD-10-CM

## 2024-03-15 LAB — CBC
HCT: 56 % — ABNORMAL HIGH (ref 36.0–46.0)
Hemoglobin: 17.1 g/dL — ABNORMAL HIGH (ref 12.0–15.0)
MCH: 24.5 pg — ABNORMAL LOW (ref 26.0–34.0)
MCHC: 30.5 g/dL (ref 30.0–36.0)
MCV: 80.3 fL (ref 80.0–100.0)
Platelets: 315 10*3/uL (ref 150–400)
RBC: 6.97 MIL/uL — ABNORMAL HIGH (ref 3.87–5.11)
RDW: 17.7 % — ABNORMAL HIGH (ref 11.5–15.5)
WBC: 10.7 10*3/uL — ABNORMAL HIGH (ref 4.0–10.5)
nRBC: 0 % (ref 0.0–0.2)

## 2024-03-15 LAB — BASIC METABOLIC PANEL WITH GFR
Anion gap: 10 (ref 5–15)
BUN: 26 mg/dL — ABNORMAL HIGH (ref 6–20)
CO2: 24 mmol/L (ref 22–32)
Calcium: 9.5 mg/dL (ref 8.9–10.3)
Chloride: 102 mmol/L (ref 98–111)
Creatinine, Ser: 0.89 mg/dL (ref 0.44–1.00)
GFR, Estimated: >60 mL/min (ref >60)
Glucose, Bld: 107 mg/dL — ABNORMAL HIGH (ref 70–99)
Potassium: 4.9 mmol/L (ref 3.5–5.1)
Sodium: 136 mmol/L (ref 135–145)

## 2024-03-15 MED ORDER — METHYLPREDNISOLONE SODIUM SUCC 40 MG IJ SOLR: 40.0000 mg | Freq: Every day | INTRAMUSCULAR | Status: DC

## 2024-03-15 MED ORDER — FLUTICASONE FUROATE-VILANTEROL 100-25 MCG/ACT IN AEPB
1.0000 | INHALATION_SPRAY | Freq: Every day | RESPIRATORY_TRACT | Status: DC
  Administered 2024-03-15 – 2024-03-17 (×3): 1 via RESPIRATORY_TRACT
  Filled 2024-03-15: qty 28

## 2024-03-15 NOTE — Progress Notes (Signed)
   03/15/24 2316  BiPAP/CPAP/SIPAP  BiPAP/CPAP/SIPAP Pt Type Adult  BiPAP/CPAP/SIPAP  (home machine)  Reason BIPAP/CPAP not in use  (Pt stated she could place self on home cpap, 2L in line)

## 2024-03-15 NOTE — Progress Notes (Signed)
 PROGRESS NOTE    Virginia Camacho  BJY:[id number] DOB: 1981-08-17 DOA: 03/11/2024 PCP: Berniece Salines, FNP    Brief Narrative:  Virginia Camacho is a 43 y.o. female with medical history significant of asthma, morbid obesity, chronic back pain, allergic rhinitis.  Chronic migraine headaches who was sent over from [facility] where she went with shortness of breath and cough.  Patient was hypoxic with oxygen sats 70% on room air.  CTA was done and chest x-ray showing infection with superimposed edema not excluded.  Patient also has some wheezing consistent with possible asthma exacerbation.  She denied any sick contact.  Viral screen is also negative.  Patient appears to have community-acquired pneumonia at this point.  She is being admitted for further evaluation and treatment.    Assessment and Plan:   acute hypoxic respiratory failure:  -Secondary to ? PNA/COPD but more likely volume overload  -wean to RA as able-- may need to go home with O2   ? community-acquired pneumonia:  - doubt-- d/c abx -breathing treatments    Acute diastolic CHF with Elevated BNP -echo (was supposed to be done last year when she saw cardiology but never done)-- D-shaped septum suggestive of RV pressure/volume overload. Right ventricular systolic function is moderately reduced.  -IV lasix- with good results -daily weight Strict I/O- down 10L thus far -cards follow up outpatient     ?acute exacerbation of asthma:  -Continue with breathing treatment  - steroids- wean to off    morbid obesity:  Estimated body mass index is 42.62 kg/m as calculated from the following:   Height as of an earlier encounter on 03/11/24: 5\' 3"  (1.6 m).   Weight as of an earlier encounter on 03/11/24: 109.1 kg.     hyperlipidemia: Continue with statin.    anxiety disorder: Continue antianxiety medications from    major depressive disorder: Continue antidepressant   OSA -cpap    DVT prophylaxis: enoxaparin  (LOVENOX) injection 40 mg Start: 03/12/24 1000    Code Status: Full Code Family Communication: mom at bedside  Disposition Plan:  Level of care: Telemetry Status is: Inpatient     Consultants:  none   Subjective: Still with swelling but improved  Objective: Vitals:   03/15/24 0904 03/15/24 0908 03/15/24 1155 03/15/24 1336  BP:    (!) 129/91  Pulse:    (!) 106  Resp:    20  Temp:    (!) 97.5 F (36.4 C)  TempSrc:    Oral  SpO2: 94% 94% 90% 93%  Weight:      Height:        Intake/Output Summary (Last 24 hours) at 03/15/2024 1402 Last data filed at 03/15/2024 1242 Gross per 24 hour  Intake 1420.19 ml  Output 3500 ml  Net -2079.81 ml   Filed Weights   03/14/24 0500 03/15/24 0358  Weight: 109.9 kg 109.8 kg    Examination:     General: Appearance:    Severely obese female in no acute distress     Lungs:      respirations unlabored, diminished, no wheezing  Heart:    Tachycardic.  MS:   All extremities are intact.   Neurologic:   Awake, alert      Data Reviewed: I have personally reviewed following labs and imaging studies  CBC: Recent Labs  Lab 03/11/24 1751 03/12/24 0405 03/13/24 0406 03/15/24 0352  WBC 6.3 5.7 12.5* 10.7*  NEUTROABS 5.6 4.8  --   --  HGB 16.3* 16.3* 16.1* 17.1*  HCT 51.2* 51.7* 53.2* 56.0*  MCV 75.7* 77.5* 80.2 80.3  PLT 310 318 308 315   Basic Metabolic Panel: Recent Labs  Lab 03/11/24 1751 03/12/24 0405 03/13/24 0406 03/14/24 0404 03/15/24 0352  NA 135 137 138 137 136  K 5.3* 4.0 4.9 5.0 4.9  CL 103 108 107 106 102  CO2 19* 16* 18* 22 24  GLUCOSE 98 156* 111* 116* 107*  BUN 16 14 21* 27* 26*  CREATININE 0.97 0.87 1.01* 0.99 0.89  CALCIUM 9.3 9.2 9.6 9.0 9.5   GFR: Estimated Creatinine Clearance: 97 mL/min (by C-G formula based on SCr of 0.89 mg/dL). Liver Function Tests: Recent Labs  Lab 03/12/24 0405  AST 32  ALT 47*  ALKPHOS 33*  BILITOT 1.4*  PROT 7.2  ALBUMIN 3.5   No results for input(s):  "LIPASE", "AMYLASE" in the last 168 hours. No results for input(s): "AMMONIA" in the last 168 hours. Coagulation Profile: No results for input(s): "INR", "PROTIME" in the last 168 hours. Cardiac Enzymes: No results for input(s): "CKTOTAL", "CKMB", "CKMBINDEX", "TROPONINI" in the last 168 hours. BNP (last 3 results) No results for input(s): "PROBNP" in the last 8760 hours. HbA1C: No results for input(s): "HGBA1C" in the last 72 hours. CBG: Recent Labs  Lab 03/12/24 1641  GLUCAP 125*   Lipid Profile: No results for input(s): "CHOL", "HDL", "LDLCALC", "TRIG", "CHOLHDL", "LDLDIRECT" in the last 72 hours. Thyroid Function Tests: No results for input(s): "TSH", "T4TOTAL", "FREET4", "T3FREE", "THYROIDAB" in the last 72 hours. Anemia Panel: No results for input(s): "VITAMINB12", "FOLATE", "FERRITIN", "TIBC", "IRON", "RETICCTPCT" in the last 72 hours. Sepsis Labs: No results for input(s): "PROCALCITON", "LATICACIDVEN" in the last 168 hours.  Recent Results (from the past 240 hours)  Resp panel by RT-PCR (RSV, Flu A&B, Covid) Anterior Nasal Swab     Status: None   Collection Time: 03/11/24  5:50 PM   Specimen: Anterior Nasal Swab  Result Value Ref Range Status   SARS Coronavirus 2 by RT PCR NEGATIVE NEGATIVE Final    Comment: (NOTE) SARS-CoV-2 target nucleic acids are NOT DETECTED.  The SARS-CoV-2 RNA is generally detectable in upper respiratory specimens during the acute phase of infection. The lowest concentration of SARS-CoV-2 viral copies this assay can detect is 138 copies/mL. A negative result does not preclude SARS-Cov-2 infection and should not be used as the sole basis for treatment or other patient management decisions. A negative result may occur with  improper specimen collection/handling, submission of specimen other than nasopharyngeal swab, presence of viral mutation(s) within the areas targeted by this assay, and inadequate number of viral copies(<138 copies/mL). A  negative result must be combined with clinical observations, patient history, and epidemiological information. The expected result is Negative.  Fact Sheet for Patients:  BloggerCourse.com  Fact Sheet for Healthcare Providers:  SeriousBroker.it  This test is no t yet approved or cleared by the [place] FDA and  has been authorized for detection and/or diagnosis of SARS-CoV-2 by FDA under an Emergency Use Authorization (EUA). This EUA will remain  in effect (meaning this test can be used) for the duration of the COVID-19 declaration under Section 564(b)(1) of the Act, 21 U.S.C.section 360bbb-3(b)(1), unless the authorization is terminated  or revoked sooner.       Influenza A by PCR NEGATIVE NEGATIVE Final   Influenza B by PCR NEGATIVE NEGATIVE Final    Comment: (NOTE) The Xpert Xpress SARS-CoV-2/FLU/RSV plus assay is intended as an aid in  the diagnosis of influenza from Nasopharyngeal swab specimens and should not be used as a sole basis for treatment. Nasal washings and aspirates are unacceptable for Xpert Xpress SARS-CoV-2/FLU/RSV testing.  Fact Sheet for Patients: BloggerCourse.com  Fact Sheet for Healthcare Providers: SeriousBroker.it  This test is not yet approved or cleared by the [place] FDA and has been authorized for detection and/or diagnosis of SARS-CoV-2 by FDA under an Emergency Use Authorization (EUA). This EUA will remain in effect (meaning this test can be used) for the duration of the COVID-19 declaration under Section 564(b)(1) of the Act, 21 U.S.C. section 360bbb-3(b)(1), unless the authorization is terminated or revoked.     Resp Syncytial Virus by PCR NEGATIVE NEGATIVE Final    Comment: (NOTE) Fact Sheet for Patients: BloggerCourse.com  Fact Sheet for Healthcare  Providers: SeriousBroker.it  This test is not yet approved or cleared by the [place] FDA and has been authorized for detection and/or diagnosis of SARS-CoV-2 by FDA under an Emergency Use Authorization (EUA). This EUA will remain in effect (meaning this test can be used) for the duration of the COVID-19 declaration under Section 564(b)(1) of the Act, 21 U.S.C. section 360bbb-3(b)(1), unless the authorization is terminated or revoked.  Performed at [facility], [address], [place], [place] [address]   Blood culture (routine x 2)     Status: None (Preliminary result)   Collection Time: 03/11/24  6:00 PM   Specimen: BLOOD RIGHT FOREARM  Result Value Ref Range Status   Specimen Description   Final    BLOOD RIGHT FOREARM Performed at [facility], [address], [place], [place] [address]    Special Requests   Final    BOTTLES DRAWN AEROBIC AND ANAEROBIC Blood Culture adequate volume Performed at [facility], [address], [place], [place] [address]    Culture   Final    NO GROWTH 3 DAYS Performed at [facility], [address] [place], [place], [place] [address]    Report Status PENDING  Incomplete  Blood culture (routine x 2)     Status: None (Preliminary result)   Collection Time: 03/11/24  9:45 PM   Specimen: BLOOD  Result Value Ref Range Status   Specimen Description   Final    BLOOD LEFT ANTECUBITAL Performed at [facility], [address] [place], [place] [address]    Special Requests   Final    BOTTLES DRAWN AEROBIC AND ANAEROBIC Blood Culture adequate volume Performed at [facility], [address] [place], [place] [address]    Culture   Final    NO GROWTH 3 DAYS Performed at [facility], [address], [place], [place] [address]    Report Status PENDING  Incomplete         Radiology Studies: No results  found.       Scheduled Meds:  enoxaparin (LOVENOX) injection  40 mg Subcutaneous Q24H   fluticasone furoate-vilanterol  1 puff Inhalation Daily   furosemide  20 mg Intravenous Q12H   ipratropium-albuterol  3 mL Nebulization TID   [START ON 03/16/2024] methylPREDNISolone (SOLU-MEDROL) injection  40 mg Intravenous Daily   Continuous Infusions:     LOS: 3 days    Time spent: 45 minutes spent on chart review, discussion with nursing staff, consultants, updating family and interview/physical exam; more than 50% of that time was spent in counseling and/or coordination of care.    Joseph Art, DO [facility] Available via Epic  secure chat 7am-7pm After these hours, please refer to coverage provider listed on [email or web address] 03/15/2024, 2:02 PM

## 2024-03-15 NOTE — Progress Notes (Signed)
 Patient has flutter at bedside and has been using.  Patient got breathing treatment and stated that she wanted to go on CPAP after treatment.  So I placed patient on CPAP.

## 2024-03-15 NOTE — Progress Notes (Signed)
 Mobility Specialist - Progress Note  Pre-mobility: 106 bpm HR, 96% SpO2 ([place] 3L) During mobility: 125 bpm HR, 87% SpO2 ([place] 6L) Post-mobility: 117 bpm HR, 90% SPO2 ([place] 3L)   03/15/24 1359  Mobility  Activity Ambulated independently in hallway  Level of Assistance Independent after set-up  Assistive Device None  Distance Ambulated (ft) 200 ft  Range of Motion/Exercises Active  Activity Response Tolerated fair  Mobility Referral Yes  Mobility visit 1 Mobility  Mobility Specialist Start Time (ACUTE ONLY) 1347  Mobility Specialist Stop Time (ACUTE ONLY) 1359  Mobility Specialist Time Calculation (min) (ACUTE ONLY) 12 min   Pt was found sitting EOB and agreeable to ambulate. No complaints with session. Able to increase SPO2 >90% within 1 min at EOS. Returned to sit EOB with all needs met. Call bell in reach.  Billey Chang Mobility Specialist

## 2024-03-15 NOTE — Plan of Care (Signed)
   Problem: Clinical Measurements: Goal: Will remain free from infection Outcome: Progressing   Problem: Clinical Measurements: Goal: Respiratory complications will improve Outcome: Progressing   Problem: Clinical Measurements: Goal: Cardiovascular complication will be avoided Outcome: Progressing   Problem: Activity: Goal: Risk for activity intolerance will decrease Outcome: Progressing

## 2024-03-15 NOTE — Plan of Care (Signed)
  Problem: Education: Goal: Knowledge of General Education information will improve Description: Including pain rating scale, medication(s)/side effects and non-pharmacologic comfort measures Outcome: Progressing   Problem: Health Behavior/Discharge Planning: Goal: Ability to manage health-related needs will improve Outcome: Progressing   Problem: Clinical Measurements: Goal: Ability to maintain clinical measurements within normal limits will improve Outcome: Progressing Goal: Will remain free from infection Outcome: Progressing Goal: Diagnostic test results will improve Outcome: Progressing Goal: Respiratory complications will improve Outcome: Progressing Goal: Cardiovascular complication will be avoided Outcome: Progressing   Problem: Activity: Goal: Risk for activity intolerance will decrease Outcome: Progressing   Problem: Nutrition: Goal: Adequate nutrition will be maintained Outcome: Progressing   Problem: Coping: Goal: Level of anxiety will decrease Outcome: Progressing   Problem: Elimination: Goal: Will not experience complications related to bowel motility Outcome: Progressing Goal: Will not experience complications related to urinary retention Outcome: Progressing   Problem: Pain Managment: Goal: General experience of comfort will improve and/or be controlled Outcome: Progressing   Problem: Safety: Goal: Ability to remain free from injury will improve Outcome: Progressing   Problem: Skin Integrity: Goal: Risk for impaired skin integrity will decrease Outcome: Progressing   Problem: Activity: Goal: Ability to tolerate increased activity will improve Outcome: Progressing   Problem: Clinical Measurements: Goal: Ability to maintain a body temperature in the normal range will improve Outcome: Progressing   Problem: Respiratory: Goal: Ability to maintain adequate ventilation will improve Outcome: Progressing Goal: Ability to maintain a clear airway  will improve Outcome: Progressing   Problem: Education: Goal: Ability to demonstrate management of disease process will improve Outcome: Progressing Goal: Ability to verbalize understanding of medication therapies will improve Outcome: Progressing Goal: Individualized Educational Video(s) Outcome: Progressing   Problem: Activity: Goal: Capacity to carry out activities will improve Outcome: Progressing   Problem: Cardiac: Goal: Ability to achieve and maintain adequate cardiopulmonary perfusion will improve Outcome: Progressing

## 2024-03-15 NOTE — Progress Notes (Signed)
 I met with Virginia Camacho and provided education about advance directives.  This hospitalization has given her the opportunity to reflect on the unexpected and she wants to be proactive.  She has already spoken with her family about her wishes and wants to name her mom, sister and fiance as her HCPOA. She plans to look it over and asked me to follow up tomorrow. I also provided prayer, at her request.

## 2024-03-15 NOTE — TOC Initial Note (Signed)
 Transition of Care [facility]) - Initial/Assessment Note    Patient Details  Name: Virginia Camacho MRN: [id number] Date of Birth: 02-05-1981  [facility] of [facility]) CM/SW Contact:    Larrie Kass, LCSW Phone Number: 03/15/2024, 3:58 PM  Clinical Narrative:                   CSW spoke with the pt. She is from home and has no transportation needs. The pt is currently on 3L of oxygen and may need home oxygen arranged. TOC to follow for d/c needs.    Expected Discharge Plan: Home/Self Care Barriers to Discharge: Continued Medical Work up   Patient Goals and CMS Choice Patient states their goals for this hospitalization and ongoing recovery are:: retrun home          Expected Discharge Plan and Services       Living arrangements for the past 2 months: Apartment                                      Prior Living Arrangements/Services Living arrangements for the past 2 months: Apartment Lives with:: Self   Do you feel safe going back to the place where you live?: Yes      Need for Family Participation in Patient Care: No (Comment) Care giver support system in place?: No (comment)   Criminal Activity/Legal Involvement Pertinent to Current Situation/Hospitalization: Yes - Comment as needed  Activities of Daily Living   ADL Screening (condition at time of admission) Independently performs ADLs?: Yes (appropriate for developmental age) Is the patient deaf or have difficulty hearing?: No Does the patient have difficulty seeing, even when wearing glasses/contacts?: No Does the patient have difficulty concentrating, remembering, or making decisions?: No  Permission Sought/Granted                  Emotional Assessment Appearance:: Appears stated age Attitude/Demeanor/Rapport: Gracious Affect (typically observed): Accepting Orientation: : Oriented to  Time, Oriented to Situation, Oriented to Place, Oriented to Self   Psych Involvement: No  (comment)  Admission diagnosis:  Pneumonia [J18.9] COPD exacerbation (HCC) [J44.1] Patient Active Problem List   Diagnosis Date Noted   Moderate persistent asthma with acute exacerbation 03/11/2024   Centrilobular emphysema (HCC) 03/11/2024   Pneumonia 03/11/2024   Reactive airway disease 04/21/2023   Mild episode of recurrent major depressive disorder (HCC) 04/21/2023   Vitamin D deficiency 04/21/2023   COVID-19 long hauler 10/21/2022   Iron deficiency anemia 10/21/2022   Hyperlipidemia 06/14/2022   Anxiety 06/14/2022   Sleep apnea 05/31/2021   Class 3 severe obesity due to excess calories with serious comorbidity and body mass index (BMI) of 40.0 to 44.9 in adult [facility]) 01/25/2019   Breast asymmetry on examination 05/08/2017   Dermatitis 05/07/2017   Acute pain of left shoulder 03/20/2016   Well woman exam with routine gynecological exam 03/07/2016   Breast mass, right 03/07/2016   Degeneration of intervertebral disc of lumbar region 12/20/2015   Chronic pain of right knee 12/20/2015   Headache, tension type, episodic 10/06/2015   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   Headache, migraine 10/06/2015   PCP:  Berniece Salines, FNP Pharmacy:   [facility] [phone] - [place], [place] - [address] [place] [address] Phone: [phone] Fax: [phone]  [facility] [address]  [place] [address] Phone: [phone] Fax: [phone]     Social Drivers of Health (SDOH) Social History: SDOH Screenings   Food Insecurity: No Food Insecurity (03/11/2024)  Housing: Low Risk  (03/11/2024)  Transportation Needs: No Transportation Needs (03/11/2024)  Utilities: Not At Risk (03/11/2024)  Alcohol Screen: Low Risk  (10/22/2023)  Depression (PHQ2-9): Low Risk  (03/11/2024)  Financial Resource Strain: Low Risk  (05/31/2021)  Physical Activity: Inactive (05/31/2021)  Social Connections: Socially Isolated (05/31/2021)   Stress: No Stress Concern Present (05/31/2021)  Tobacco Use: Medium Risk (03/11/2024)   SDOH Interventions:     Readmission Risk Interventions     No data to display

## 2024-03-16 DIAGNOSIS — K2289 Other specified disease of esophagus: Secondary | ICD-10-CM | POA: Diagnosis not present

## 2024-03-16 DIAGNOSIS — J441 Chronic obstructive pulmonary disease with (acute) exacerbation: Secondary | ICD-10-CM | POA: Diagnosis not present

## 2024-03-16 LAB — CBC
HCT: 49.4 % — ABNORMAL HIGH (ref 36.0–46.0)
Hemoglobin: 15.2 g/dL — ABNORMAL HIGH (ref 12.0–15.0)
MCH: 24.4 pg — ABNORMAL LOW (ref 26.0–34.0)
MCHC: 30.8 g/dL (ref 30.0–36.0)
MCV: 79.3 fL — ABNORMAL LOW (ref 80.0–100.0)
Platelets: 254 10*3/uL (ref 150–400)
RBC: 6.23 MIL/uL — ABNORMAL HIGH (ref 3.87–5.11)
RDW: 16.7 % — ABNORMAL HIGH (ref 11.5–15.5)
WBC: 8.6 10*3/uL (ref 4.0–10.5)
nRBC: 0 % (ref 0.0–0.2)

## 2024-03-16 LAB — BASIC METABOLIC PANEL WITH GFR
Anion gap: 8 (ref 5–15)
BUN: 24 mg/dL — ABNORMAL HIGH (ref 6–20)
CO2: 24 mmol/L (ref 22–32)
Calcium: 8.9 mg/dL (ref 8.9–10.3)
Chloride: 105 mmol/L (ref 98–111)
Creatinine, Ser: 0.78 mg/dL (ref 0.44–1.00)
GFR, Estimated: >60 mL/min (ref >60)
Glucose, Bld: 92 mg/dL (ref 70–99)
Potassium: 4.1 mmol/L (ref 3.5–5.1)
Sodium: 137 mmol/L (ref 135–145)

## 2024-03-16 LAB — BRAIN NATRIURETIC PEPTIDE: B Natriuretic Peptide: 35.9 pg/mL (ref 0.0–100.0)

## 2024-03-16 MED ORDER — ACETAMINOPHEN 325 MG PO TABS: 650.0000 mg | ORAL_TABLET | Freq: Once | ORAL | Status: DC | PRN

## 2024-03-16 MED ORDER — ACETAMINOPHEN 325 MG PO TABS
650.0000 mg | ORAL_TABLET | Freq: Once | ORAL | Status: AC | PRN
  Administered 2024-03-16: 650 mg via ORAL
  Filled 2024-03-16: qty 2

## 2024-03-16 MED ORDER — IPRATROPIUM-ALBUTEROL 0.5-2.5 (3) MG/3ML IN SOLN
3.0000 mL | Freq: Two times a day (BID) | RESPIRATORY_TRACT | Status: DC
  Administered 2024-03-16 – 2024-03-17 (×2): 3 mL via RESPIRATORY_TRACT
  Filled 2024-03-16 (×2): qty 3

## 2024-03-16 MED ORDER — PREDNISONE 20 MG PO TABS
40.0000 mg | ORAL_TABLET | Freq: Every day | ORAL | Status: DC
  Administered 2024-03-17: 40 mg via ORAL
  Filled 2024-03-16: qty 2

## 2024-03-16 MED ORDER — METHYLPREDNISOLONE SODIUM SUCC 40 MG IJ SOLR
40.0000 mg | Freq: Every day | INTRAMUSCULAR | Status: AC
  Administered 2024-03-16: 40 mg via INTRAVENOUS
  Filled 2024-03-16: qty 1

## 2024-03-16 MED ORDER — ACETAMINOPHEN 325 MG PO TABS
650.0000 mg | ORAL_TABLET | Freq: Four times a day (QID) | ORAL | Status: DC | PRN
  Administered 2024-03-16 – 2024-03-17 (×2): 650 mg via ORAL
  Filled 2024-03-16 (×2): qty 2

## 2024-03-16 NOTE — Progress Notes (Signed)
 I followed up with Sutton to answer questions about HCPOA.  She has not had time to look over the paperwork and would like to follow up tomorrow.

## 2024-03-16 NOTE — Progress Notes (Signed)
 PROGRESS NOTE    EPIFANIA LITTRELL  QIO:[id number] DOB: 1981-02-14 DOA: 03/11/2024 PCP: Berniece Salines, FNP    Brief Narrative:  Virginia Camacho is a 43 y.o. female with medical history significant of asthma, morbid obesity, chronic back pain, allergic rhinitis.  Chronic migraine headaches who was sent over from [facility] where she went with shortness of breath and cough.  Patient was hypoxic with oxygen sats 70% on room air.  CTA was done and chest x-ray showing infection with superimposed edema not excluded.  Appears less likely to be pneumonia and more likely to be volume overloaded.  Has responded well to IV Lasix and oxygen is being weaned to room air  Assessment and Plan:   acute hypoxic respiratory failure:  -Secondary to ? PNA/COPD but more likely volume overload  -wean to RA as able-- home o2 study      Acute diastolic CHF with Elevated BNP -echo (was supposed to be done last year when she saw cardiology but never done)-- D-shaped septum suggestive of RV pressure/volume overload. Right ventricular systolic function is moderately reduced.  -IV lasix- with good results -daily weight Strict I/O -cards follow up outpatient    ? community-acquired pneumonia:  - doubt-- d/c abx  ?acute exacerbation of asthma:  -Continue with breathing treatment  - steroids- wean to off    morbid obesity:  Estimated body mass index is 42.62 kg/m as calculated from the following:   Height as of an earlier encounter on 03/11/24: 5\' 3"  (1.6 m).   Weight as of an earlier encounter on 03/11/24: 109.1 kg.       OSA -cpap  Paraesophageal lesion/mass - GI referral placed for outpatient follow-up and workup once breathing has improved   DVT prophylaxis: enoxaparin (LOVENOX) injection 40 mg Start: 03/12/24 1000    Code Status: Full Code Family Communication: mom at bedside 4/7  Disposition Plan:  Level of care: Telemetry Status is: Inpatient     Consultants:   none   Subjective: Oxygen was able to be weaned to room air but patient has not ambulated yet Shortness of breath much better  Objective: Vitals:   03/16/24 0500 03/16/24 0501 03/16/24 0906 03/16/24 0908  BP: (!) 125/90     Pulse: 90     Resp: 18 20    Temp: 97.8 F (36.6 C)     TempSrc: Oral     SpO2: 100%  97% 97%  Weight:      Height:        Intake/Output Summary (Last 24 hours) at 03/16/2024 1215 Last data filed at 03/16/2024 1032 Gross per 24 hour  Intake 969.29 ml  Output 2300 ml  Net -1330.71 ml   Filed Weights   03/14/24 0500 03/15/24 0358 03/16/24 0500  Weight: 109.9 kg 109.8 kg 110 kg    Examination:     General: Appearance:    Severely obese female in no acute distress     Lungs:      respirations unlabored, diminished, no wheezing  Heart:    Normal heart rate.some LE edema remains  MS:   All extremities are intact.   Neurologic:   Awake, alert      Data Reviewed: I have personally reviewed following labs and imaging studies  CBC: Recent Labs  Lab 03/11/24 1751 03/12/24 0405 03/13/24 0406 03/15/24 0352 03/16/24 0403  WBC 6.3 5.7 12.5* 10.7* 8.6  NEUTROABS 5.6 4.8  --   --   --   HGB  16.3* 16.3* 16.1* 17.1* 15.2*  HCT 51.2* 51.7* 53.2* 56.0* 49.4*  MCV 75.7* 77.5* 80.2 80.3 79.3*  PLT 310 318 308 315 254   Basic Metabolic Panel: Recent Labs  Lab 03/12/24 0405 03/13/24 0406 03/14/24 0404 03/15/24 0352 03/16/24 0403  NA 137 138 137 136 137  K 4.0 4.9 5.0 4.9 4.1  CL 108 107 106 102 105  CO2 16* 18* 22 24 24   GLUCOSE 156* 111* 116* 107* 92  BUN 14 21* 27* 26* 24*  CREATININE 0.87 1.01* 0.99 0.89 0.78  CALCIUM 9.2 9.6 9.0 9.5 8.9   GFR: Estimated Creatinine Clearance: 107.9 mL/min (by C-G formula based on SCr of 0.78 mg/dL). Liver Function Tests: Recent Labs  Lab 03/12/24 0405  AST 32  ALT 47*  ALKPHOS 33*  BILITOT 1.4*  PROT 7.2  ALBUMIN 3.5   No results for input(s): "LIPASE", "AMYLASE" in the last 168 hours. No  results for input(s): "AMMONIA" in the last 168 hours. Coagulation Profile: No results for input(s): "INR", "PROTIME" in the last 168 hours. Cardiac Enzymes: No results for input(s): "CKTOTAL", "CKMB", "CKMBINDEX", "TROPONINI" in the last 168 hours. BNP (last 3 results) No results for input(s): "PROBNP" in the last 8760 hours. HbA1C: No results for input(s): "HGBA1C" in the last 72 hours. CBG: Recent Labs  Lab 03/12/24 1641  GLUCAP 125*   Lipid Profile: No results for input(s): "CHOL", "HDL", "LDLCALC", "TRIG", "CHOLHDL", "LDLDIRECT" in the last 72 hours. Thyroid Function Tests: No results for input(s): "TSH", "T4TOTAL", "FREET4", "T3FREE", "THYROIDAB" in the last 72 hours. Anemia Panel: No results for input(s): "VITAMINB12", "FOLATE", "FERRITIN", "TIBC", "IRON", "RETICCTPCT" in the last 72 hours. Sepsis Labs: No results for input(s): "PROCALCITON", "LATICACIDVEN" in the last 168 hours.  Recent Results (from the past 240 hours)  Resp panel by RT-PCR (RSV, Flu A&B, Covid) Anterior Nasal Swab     Status: None   Collection Time: 03/11/24  5:50 PM   Specimen: Anterior Nasal Swab  Result Value Ref Range Status   SARS Coronavirus 2 by RT PCR NEGATIVE NEGATIVE Final    Comment: (NOTE) SARS-CoV-2 target nucleic acids are NOT DETECTED.  The SARS-CoV-2 RNA is generally detectable in upper respiratory specimens during the acute phase of infection. The lowest concentration of SARS-CoV-2 viral copies this assay can detect is 138 copies/mL. A negative result does not preclude SARS-Cov-2 infection and should not be used as the sole basis for treatment or other patient management decisions. A negative result may occur with  improper specimen collection/handling, submission of specimen other than nasopharyngeal swab, presence of viral mutation(s) within the areas targeted by this assay, and inadequate number of viral copies(<138 copies/mL). A negative result must be combined with clinical  observations, patient history, and epidemiological information. The expected result is Negative.  Fact Sheet for Patients:  BloggerCourse.com  Fact Sheet for Healthcare Providers:  SeriousBroker.it  This test is no t yet approved or cleared by the [place] FDA and  has been authorized for detection and/or diagnosis of SARS-CoV-2 by FDA under an Emergency Use Authorization (EUA). This EUA will remain  in effect (meaning this test can be used) for the duration of the COVID-19 declaration under Section 564(b)(1) of the Act, 21 U.S.C.section 360bbb-3(b)(1), unless the authorization is terminated  or revoked sooner.       Influenza A by PCR NEGATIVE NEGATIVE Final   Influenza B by PCR NEGATIVE NEGATIVE Final    Comment: (NOTE) The Xpert Xpress SARS-CoV-2/FLU/RSV plus assay is intended as  an aid in the diagnosis of influenza from Nasopharyngeal swab specimens and should not be used as a sole basis for treatment. Nasal washings and aspirates are unacceptable for Xpert Xpress SARS-CoV-2/FLU/RSV testing.  Fact Sheet for Patients: BloggerCourse.com  Fact Sheet for Healthcare Providers: SeriousBroker.it  This test is not yet approved or cleared by the [place] FDA and has been authorized for detection and/or diagnosis of SARS-CoV-2 by FDA under an Emergency Use Authorization (EUA). This EUA will remain in effect (meaning this test can be used) for the duration of the COVID-19 declaration under Section 564(b)(1) of the Act, 21 U.S.C. section 360bbb-3(b)(1), unless the authorization is terminated or revoked.     Resp Syncytial Virus by PCR NEGATIVE NEGATIVE Final    Comment: (NOTE) Fact Sheet for Patients: BloggerCourse.com  Fact Sheet for Healthcare Providers: SeriousBroker.it  This test is not yet approved or cleared by  the [place] FDA and has been authorized for detection and/or diagnosis of SARS-CoV-2 by FDA under an Emergency Use Authorization (EUA). This EUA will remain in effect (meaning this test can be used) for the duration of the COVID-19 declaration under Section 564(b)(1) of the Act, 21 U.S.C. section 360bbb-3(b)(1), unless the authorization is terminated or revoked.  Performed at [facility], [address] [place], [place] [address]   Blood culture (routine x 2)     Status: None (Preliminary result)   Collection Time: 03/11/24  6:00 PM   Specimen: BLOOD RIGHT FOREARM  Result Value Ref Range Status   Specimen Description   Final    BLOOD RIGHT FOREARM Performed at [facility], [address] [place], [place] [address]    Special Requests   Final    BOTTLES DRAWN AEROBIC AND ANAEROBIC Blood Culture adequate volume Performed at [facility], [address] [place], [place] [address]    Culture   Final    NO GROWTH 4 DAYS Performed at [facility], [address], [place], [place] [address]    Report Status PENDING  Incomplete  Blood culture (routine x 2)     Status: None (Preliminary result)   Collection Time: 03/11/24  9:45 PM   Specimen: BLOOD  Result Value Ref Range Status   Specimen Description   Final    BLOOD LEFT ANTECUBITAL Performed at [facility], [address], [place], [place] [address]    Special Requests   Final    BOTTLES DRAWN AEROBIC AND ANAEROBIC Blood Culture adequate volume Performed at [facility], [address], [place], [place] [address]    Culture   Final    NO GROWTH 4 DAYS Performed at [facility], [address], [place], [place] [address]    Report Status PENDING  Incomplete         Radiology Studies: No results found.       Scheduled Meds:  enoxaparin (LOVENOX) injection  40 mg Subcutaneous Q24H    fluticasone furoate-vilanterol  1 puff Inhalation Daily   furosemide  20 mg Intravenous Q12H   ipratropium-albuterol  3 mL Nebulization BID   [START ON 03/17/2024] predniSONE  40 mg Oral Q breakfast   Continuous Infusions:     LOS: 4 days    Time spent: 45 minutes spent on chart review, discussion with nursing staff, consultants, updating family and interview/physical exam; more than 50% of that time was spent in counseling and/or coordination of care.    Joseph Art, DO [facility] Available  via Epic secure chat 7am-7pm After these hours, please refer to coverage provider listed on [email or web address] 03/16/2024, 12:15 PM

## 2024-03-16 NOTE — Progress Notes (Signed)
 Patient weaned to room air at 0700 sating 99%. Patient was able to walk to bathroom and back with O2 staying at 99%. Care plan continued,

## 2024-03-17 ENCOUNTER — Other Ambulatory Visit (HOSPITAL_COMMUNITY): Payer: Self-pay

## 2024-03-17 DIAGNOSIS — I5030 Unspecified diastolic (congestive) heart failure: Secondary | ICD-10-CM

## 2024-03-17 DIAGNOSIS — G4733 Obstructive sleep apnea (adult) (pediatric): Secondary | ICD-10-CM

## 2024-03-17 DIAGNOSIS — J9601 Acute respiratory failure with hypoxia: Secondary | ICD-10-CM | POA: Diagnosis not present

## 2024-03-17 DIAGNOSIS — I2609 Other pulmonary embolism with acute cor pulmonale: Secondary | ICD-10-CM

## 2024-03-17 LAB — CULTURE, BLOOD (ROUTINE X 2)
Culture: NO GROWTH
Culture: NO GROWTH
Special Requests: ADEQUATE
Special Requests: ADEQUATE

## 2024-03-17 MED ORDER — PREDNISONE 20 MG PO TABS
40.0000 mg | ORAL_TABLET | Freq: Every day | ORAL | 0 refills | Status: AC
  Filled 2024-03-17: qty 8, 4d supply, fill #0

## 2024-03-17 MED ORDER — POTASSIUM CHLORIDE CRYS ER 20 MEQ PO TBCR
10.0000 meq | EXTENDED_RELEASE_TABLET | Freq: Every day | ORAL | 0 refills | Status: DC
  Filled 2024-03-17: qty 15, 30d supply, fill #0

## 2024-03-17 MED ORDER — FUROSEMIDE 20 MG PO TABS
20.0000 mg | ORAL_TABLET | Freq: Two times a day (BID) | ORAL | 0 refills | Status: DC
  Filled 2024-03-17: qty 60, 30d supply, fill #0

## 2024-03-17 MED ORDER — LOSARTAN POTASSIUM 25 MG PO TABS
25.0000 mg | ORAL_TABLET | Freq: Every day | ORAL | 0 refills | Status: DC
  Filled 2024-03-17: qty 30, 30d supply, fill #0

## 2024-03-17 NOTE — Discharge Instructions (Signed)
 You were cared for by a hospitalist during your hospital stay. Please review all of you discharge paperwork on the day of discharge and be sure you have all of your prescribed medications and please read the below instructions:  Once you are discharged, your primary care physician will handle any further medical issues. Please note that NO REFILLS for any discharge medications will be authorized once you are discharged as it is imperative that you return to your primary care physician (or establish a relationship with a primary care physician if you do not have one) for your aftercare needs. Please obtain a follow up appointment with your primary care physician within 1-2 weeks of discharge. Please take all your medications with you for your next visit with your Primary MD. Please request your Primary MD to go over all Hospital Tests and Procedures, Radiological results at the follow up appointment. In some cases, there will be blood work, cultures and biopsy results pending at the time of your discharge. Please request that your primary care M.D. goes through all the records of your hospital data and follows up on these results. Please get all hospital records sent to your primary MD by signing hospital release before you go home or request your primary care doctor's office to assist with obtaining medical records.   You must read complete instructions/literature along with all the possible adverse reactions/side effects for all the medicines that have been prescribed to you. Take any new medicines after you have completely understood and accpet all the possible adverse reactions/side effects.  Please take medications as prescribed and speak with your doctor if changes are needed.   If you have smoked or chewed tobacco in the last 2 yrs please stop. Stop any regular alcohol  and or any recreational drug use. Wear Seat belts while driving.   If you had Pneumonia at the Hospital: Please get a 2 view  Chest X ray done in 6-8 weeks after hospital discharge or sooner if instructed by your Primary MD.   If you have Congestive Heart Failure: Follow a cardiac low salt diet and 1.5 lit/day fluid restriction. Please call your Cardiologist or Primary MD anytime you have any of the following symptoms:  1) 3 pound weight gain in 24 hours or 5 pounds in 1 week  2) shortness of breath, with or without a dry hacking cough  3) increasing swelling in the feet or stomach  4) if you have to sleep on extra pillows at night in order to breathe   If you have Diabetes: Check blood sugars 4 times/day- once on AM empty stomach and then before each meal. Log in all results and show them to your primary doctor at your next visit. If glucose readings are often under 60 or above 400 call your primary MD to see if medication dosages need to be adjusted   If you have Syncope (passing out) or Seizure/Convulsions/Epilepsy: Please do not drive, operate heavy machinery, participate in activities at heights or participate in high speed sports until you have seen by Primary MD or a Neurologist and advised to do so again. Per [facility] statutes, patients with seizures are not allowed to drive until they have been seizure-free for six months.  Use caution when using heavy equipment or power tools. Avoid working on ladders or at heights. Take showers instead of baths. Ensure the water temperature is not too high on the home water heater. Do not go swimming alone. Do not lock yourself  in a room alone (i.e. bathroom). When caring for infants or small children, sit down when holding, feeding, or changing them to minimize risk of injury to the child in the event you have a seizure. Maintain good sleep hygiene. Avoid alcohol.    If you had Gastrointestinal Bleeding: Please ask your Primary MD to check a complete blood count within one week of discharge or at your next visit. Your endoscopic/colonoscopic biopsies that are  pending at the time of discharge will also need to followed by your Primary MD.    Bonita Quin can reach the hospitalist office at phone [phone] or fax [phone]   If you do not have a primary care physician, you can call [phone] for a physician referral.

## 2024-03-17 NOTE — Discharge Summary (Incomplete)
 Physician Discharge Summary  Virginia Camacho XBJ:[id number] DOB: May 11, 1981 DOA: 03/11/2024  PCP: Berniece Salines, FNP  Admit date: 03/11/2024 Discharge date: 03/17/2024 Discharging to: *** Recommendations for Outpatient Follow-up:  ***  Consults:  *** Procedures:  ***   Discharge Diagnoses:   Principal Problem:   Pneumonia Active Problems:   Chronic low back pain with left-sided sciatica   Class 3 severe obesity due to excess calories with serious comorbidity and body mass index (BMI) of 40.0 to 44.9 in adult Virginia Camacho)   Hyperlipidemia   Anxiety   Mild episode of recurrent major depressive disorder (HCC)   Moderate persistent asthma with acute exacerbation     Camacho Course:  ***  Principal Problem:   Pneumonia Active Problems:   Chronic low back pain with left-sided sciatica   Class 3 severe obesity due to excess calories with serious comorbidity and body mass index (BMI) of 40.0 to 44.9 in adult Virginia Camacho)   Hyperlipidemia   Anxiety   Mild episode of recurrent major depressive disorder (HCC)   Moderate persistent asthma with acute exacerbation    Body mass index is 42.11 kg/m. Nutrition Status:          Discharge Instructions  Discharge Instructions     Ambulatory referral to Gastroenterology   Complete by: As directed    What is the reason for referral?: Other Comment - paraesophageal lesion/mass   Diet - low sodium heart healthy   Complete by: As directed    Increase activity slowly   Complete by: As directed       Allergies as of 03/17/2024       Reactions   Oxycodone Hives        Medication List     TAKE these medications    furosemide 20 MG tablet Commonly known as: Lasix Take 1 tablet (20 mg total) by mouth 2 (two) times daily.   Iron (Ferrous Sulfate) 325 (65 Fe) MG Tabs Take 325 mg by mouth daily.   losartan 25 MG tablet Commonly known as: Cozaar Take 1 tablet (25 mg total) by mouth daily.   potassium chloride 10 MEQ  tablet Commonly known as: KLOR-CON M Take 1 tablet (10 mEq total) by mouth daily.   predniSONE 20 MG tablet Commonly known as: DELTASONE Take 2 tablets (40 mg total) by mouth daily with breakfast for 4 days. Start taking on: March 18, 2024   Spiriva Respimat 2.5 MCG/ACT Aers Generic drug: Tiotropium Bromide Monohydrate Inhale 2 puffs into the lungs daily.   VITAMIN D PO Take 1 tablet by mouth daily at 12 noon.   Wixela Inhub 100-50 MCG/ACT Aepb Generic drug: fluticasone-salmeterol INHALE 1 DOSE BY MOUTH TWICE A DAY What changed: See the new instructions.        Follow-up Information     Berniece Salines, FNP Follow up.   Specialty: Nurse Practitioner Why: Bmet, Magnesium Contact information: [address] [place] [address] [phone]                    The results of significant diagnostics from this hospitalization (including imaging, microbiology, ancillary and laboratory) are listed below for reference.    ECHOCARDIOGRAM COMPLETE Result Date: 03/12/2024    ECHOCARDIOGRAM REPORT   Patient Name:   Virginia Camacho Date of Exam: 03/12/2024 Medical Rec #:  [id number]        Height:       63.0 in Accession #:    [id number]  Weight:       240.6 lb Date of Birth:  1980-12-14        BSA:          2.092 m Patient Age:    42 years         BP:           147/89 mmHg Patient Gender: F                HR:           109 bpm. Exam Location:  Inpatient Procedure: 2D Echo, Cardiac Doppler and Color Doppler (Both Spectral and Color            Flow Doppler were utilized during procedure). Indications:    I50.31 Acute diastolic (congestive) heart failure  History:        Patient has no prior history of Echocardiogram examinations.                 Signs/Symptoms:Shortness of Breath.  Sonographer:    Maxwell Marion Referring Phys: [phone] JESSICA U VANN IMPRESSIONS  1. Left ventricular ejection fraction, by estimation, is 55%. The left ventricle has normal function. The  left ventricle has no regional wall motion abnormalities. Indeterminate diastolic filling due to E-A fusion.  2. D-shaped septum suggestive of RV pressure/volume overload. Right ventricular systolic function is moderately reduced. The right ventricular size is severely enlarged. There is moderately elevated pulmonary artery systolic pressure. The estimated right ventricular systolic pressure is 52.1 mmHg.  3. Right atrial size was mildly dilated.  4. The mitral valve is normal in structure. No evidence of mitral valve regurgitation. No evidence of mitral stenosis.  5. The aortic valve is tricuspid. Aortic valve regurgitation is not visualized. No aortic stenosis is present.  6. The inferior vena cava is dilated in size with >50% respiratory variability, suggesting right atrial pressure of 8 mmHg. FINDINGS  Left Ventricle: Left ventricular ejection fraction, by estimation, is 55%. The left ventricle has normal function. The left ventricle has no regional wall motion abnormalities. Definity contrast agent was given IV to delineate the left ventricular endocardial borders. The left ventricular internal cavity size was normal in size. There is no left ventricular hypertrophy. Indeterminate diastolic filling due to E-A fusion. Right Ventricle: D-shaped septum suggestive of RV pressure/volume overload. The right ventricular size is severely enlarged. No increase in right ventricular wall thickness. Right ventricular systolic function is moderately reduced. There is moderately elevated pulmonary artery systolic pressure. The tricuspid regurgitant velocity is 3.32 m/s, and with an assumed right atrial pressure of 8 mmHg, the estimated right ventricular systolic pressure is 52.1 mmHg. Left Atrium: Left atrial size was normal in size. Right Atrium: Right atrial size was mildly dilated. Pericardium: There is no evidence of pericardial effusion. Mitral Valve: The mitral valve is normal in structure. No evidence of mitral valve  regurgitation. No evidence of mitral valve stenosis. Tricuspid Valve: The tricuspid valve is normal in structure. Tricuspid valve regurgitation is mild. Aortic Valve: The aortic valve is tricuspid. Aortic valve regurgitation is not visualized. No aortic stenosis is present. Aortic valve mean gradient measures 2.0 mmHg. Aortic valve peak gradient measures 3.9 mmHg. Aortic valve area, by VTI measures 2.24 cm. Pulmonic Valve: The pulmonic valve was normal in structure. Pulmonic valve regurgitation is trivial. Aorta: The aortic root is normal in size and structure. Venous: The inferior vena cava is dilated in size with greater than 50% respiratory variability, suggesting right atrial pressure of 8 mmHg. IAS/Shunts:  No atrial level shunt detected by color flow Doppler.  LEFT VENTRICLE PLAX 2D LVIDd:         3.80 cm     Diastology LVIDs:         2.80 cm     LV e' medial: 13.70 cm/s LV PW:         1.10 cm LV IVS:        1.20 cm LVOT diam:     1.90 cm LV SV:         34 LV SV Index:   16 LVOT Area:     2.84 cm  LV Volumes (MOD) LV vol d, MOD A2C: 92.7 ml LV vol d, MOD A4C: 74.4 ml LV vol s, MOD A2C: 39.9 ml LV vol s, MOD A4C: 41.6 ml LV SV MOD A2C:     52.8 ml LV SV MOD A4C:     74.4 ml LV SV MOD BP:      42.5 ml RIGHT VENTRICLE RV S prime:     8.81 cm/s TAPSE (M-mode): 1.6 cm LEFT ATRIUM             Index LA Vol (A2C):   29.0 ml 13.86 ml/m LA Vol (A4C):   14.6 ml 6.98 ml/m LA Biplane Vol: 21.6 ml 10.33 ml/m  AORTIC VALVE AV Area (Vmax):    2.29 cm AV Area (Vmean):   1.94 cm AV Area (VTI):     2.24 cm AV Vmax:           99.10 cm/s AV Vmean:          68.600 cm/s AV VTI:            0.152 m AV Peak Grad:      3.9 mmHg AV Mean Grad:      2.0 mmHg LVOT Vmax:         80.20 cm/s LVOT Vmean:        46.900 cm/s LVOT VTI:          0.120 m LVOT/AV VTI ratio: 0.79  AORTA Ao Asc diam: 3.50 cm TRICUSPID VALVE TR Peak grad:   44.1 mmHg TR Vmax:        332.00 cm/s  SHUNTS Systemic VTI:  0.12 m Systemic Diam: 1.90 cm Dalton McleanMD  Electronically signed by Wilfred Lacy Signature Date/Time: 03/12/2024/12:31:55 PM    Final    CT Angio Chest PE W and/or Wo Contrast Result Date: 03/11/2024 CLINICAL DATA:  Letter intermediate probably pulmonary embolism, positive D-dimer, dyspnea, hypoxia EXAM: CT ANGIOGRAPHY CHEST WITH CONTRAST TECHNIQUE: Multidetector CT imaging of the chest was performed using the standard protocol during bolus administration of intravenous contrast. Multiplanar CT image reconstructions and MIPs were obtained to evaluate the vascular anatomy. RADIATION DOSE REDUCTION: This exam was performed according to the departmental dose-optimization program which includes automated exposure control, adjustment of the mA and/or kV according to patient size and/or use of iterative reconstruction technique. CONTRAST:  OMNIPAQUE IOHEXOL 350 MG/ML SOLN COMPARISON:  02/23/2024 FINDINGS: Cardiovascular: Adequate opacification of the pulmonary arterial tree. No intraluminal filling defect identified to suggest acute pulmonary embolism. Central pulmonary arteries are of normal caliber. No significant coronary artery calcification. Cardiac size within normal limits. No pericardial effusion. No significant atherosclerotic calcification within the thoracic aorta. No aortic aneurysm. 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 This is not well characterized on this examination due to phase of contrast enhancement. As noted  previously, this may represent a cystic collection such as a foregut duplication cyst though a esophageal mass such as a leiomyoma or lymphadenopathy is not completely excluded on this limited examination. Visualized thyroid is unremarkable. The esophagus is otherwise unremarkable. Lungs/Pleura: Severe centrilobular emphysema is again identified. There has developed superimposed diffuse interstitial infiltrate with more extensive  consolidation within the right lower lobe most in keeping with acute infection. A component of superimposed edema is difficult to exclude. No pneumothorax or pleural effusion. No central obstructing lesion. Upper Abdomen: No acute abnormality. Musculoskeletal: No chest wall abnormality. No acute or significant osseous findings. Review of the MIP images confirms the above findings. IMPRESSION: 1. No pulmonary embolism. 2. Interval development of diffuse interstitial infiltrate with more extensive consolidation within the right lower lobe most in keeping with acute infection. A component of superimposed edema is difficult to exclude. 3. Stable retrocardiac, paraesophageal low-attenuation mass measuring at least 5.0 x 8.5 x 10.3 cm in greatest dimension. This is not well characterized on this examination due to phase of contrast enhancement. As noted previously, this may represent a cystic collection such as a foregut duplication cyst though a esophageal mass such as a leiomyoma or lymphadenopathy is not completely excluded on this limited examination. If indicated, this could be better assessed with contrast enhanced MRI examination or endoscopic ultrasound. Emphysema (ICD10-J43.9). Electronically Signed   By: Helyn Numbers M.D.   On: 03/11/2024 20:00   CT CHEST HIGH RESOLUTION Result Date: 03/11/2024 CLINICAL DATA:  Shortness of breath. EXAM: CT CHEST WITHOUT CONTRAST TECHNIQUE: Multidetector CT imaging of the chest was performed following the standard protocol without intravenous contrast. High resolution imaging of the lungs, as well as inspiratory and expiratory imaging, was performed. RADIATION DOSE REDUCTION: This exam was performed according to the departmental dose-optimization program which includes automated exposure control, adjustment of the mA and/or kV according to patient size and/or use of iterative reconstruction technique. COMPARISON:  None Available. FINDINGS: Cardiovascular: No significant  vascular findings. Normal heart size. No pericardial effusion. Mediastinum/Nodes: Lower posterior mediastinal mass like opacity identified measuring 6.2 x 5.3 x 11.0 cm. This is seen to the right of the distal esophagus. Esophageal involvement can not be excluded. This is low-density measuring 3 Hounsfield units. Upper esophagus is nondilated. Enlarged paraesophageal lymph node measures 11 mm short axis. Nonenlarged prevascular lymph nodes are seen. Visualized thyroid gland is within normal limits. Lungs/Pleura: There is moderate severe diffuse centrilobular emphysema involving both lungs. There is a small amount of atelectasis in the inferior right lower lobe. There is a nodular density in the inferior right lower lobe measuring 4 mm image 4/243. There some right lower lobe peribronchial wall thickening. There is no bronchiectasis, ground-glass opacity or interstitial opacities. Micro nodules measuring less than 2 mm are clustered in the left lung apex. No areas of air trapping. No pleural effusion or pneumothorax. Trachea appears normal. Upper Abdomen: No acute abnormality. There is colonic diverticulosis. Musculoskeletal: No chest wall mass or suspicious bone lesions identified. IMPRESSION: 1. Lower posterior mediastinal mass like opacity measuring 6.2 x 5.3 x 11.0 cm. This is seen to the right of the distal esophagus. Esophageal involvement can not be excluded. This is low-density and favored as cystic, but incompletely evaluated on this noncontrast study. 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 2. Single enlarged paraesophageal lymph node measuring 11 mm short axis. 3. 4 mm nodular density in the inferior right lower lobe. 4. Right lower lobe peribronchial  wall thickening worrisome for bronchitis. 5. Moderate to severe diffuse centrilobular emphysema. 6. Colonic diverticulosis. Emphysema (ICD10-J43.9). Electronically Signed   By: Darliss Cheney M.D.   On:  03/11/2024 18:39   DG Chest Portable 1 View Result Date: 03/11/2024 CLINICAL DATA:  Shortness of breath EXAM: PORTABLE CHEST 1 VIEW COMPARISON:  Chest x-ray 02/09/2024.  Chest CT 02/23/2024. FINDINGS: Heart size is within normal limits. Lower mediastinal masslike opacity appears unchanged from prior CT. Diffuse emphysematous changes are again noted. Both lungs are clear. The visualized skeletal structures are unremarkable. IMPRESSION: No active disease. Electronically Signed   By: Darliss Cheney M.D.   On: 03/11/2024 18:31   Labs:   Basic Metabolic Panel: Recent Labs  Lab 03/12/24 0405 03/13/24 0406 03/14/24 0404 03/15/24 0352 03/16/24 0403  NA 137 138 137 136 137  K 4.0 4.9 5.0 4.9 4.1  CL 108 107 106 102 105  CO2 16* 18* 22 24 24   GLUCOSE 156* 111* 116* 107* 92  BUN 14 21* 27* 26* 24*  CREATININE 0.87 1.01* 0.99 0.89 0.78  CALCIUM 9.2 9.6 9.0 9.5 8.9     CBC: Recent Labs  Lab 03/11/24 1751 03/12/24 0405 03/13/24 0406 03/15/24 0352 03/16/24 0403  WBC 6.3 5.7 12.5* 10.7* 8.6  NEUTROABS 5.6 4.8  --   --   --   HGB 16.3* 16.3* 16.1* 17.1* 15.2*  HCT 51.2* 51.7* 53.2* 56.0* 49.4*  MCV 75.7* 77.5* 80.2 80.3 79.3*  PLT 310 318 308 315 254         SIGNED:   Calvert Cantor, MD  [facility] 03/17/2024, 9:38 AM Time taking on discharge: 50 minutes

## 2024-03-17 NOTE — Discharge Summary (Signed)
 Physician Discharge Summary  Virginia Camacho VWU:[id number] DOB: 1981/04/25 DOA: 03/11/2024  PCP: Berniece Salines, FNP  Admit date: 03/11/2024 Discharge date: 03/17/2024 Discharging to: Home Recommendations for Outpatient Follow-up:  The patient will need follow-up of mediastinal mass and positive ANA  Consults:  None Procedures:  None   Discharge Diagnoses:   Principal Problem:   Acute respiratory failure with hypoxia (HCC) Active Problems:   Chronic low back pain with left-sided sciatica   Class 3 severe obesity due to excess calories with serious comorbidity and body mass index (BMI) of 40.0 to 44.9 in adult [facility])   Hyperlipidemia   Anxiety   Mild episode of recurrent major depressive disorder (HCC)   Moderate persistent asthma with acute exacerbation   OSA (obstructive sleep apnea)   (HFpEF) heart failure with preserved ejection fraction (HCC)   Cor, pulmonale, acute [facility])     Hospital Course:  This is a 43 year old female with asthma, emphysema, obstructive sleep apnea, chronic migraines, chronic back pain, morbid obesity who presented to the hospital for shortness of breath and hypoxia.  Pulse ox was noted to be 70% on room air.  CT of the chest revealed diffuse infiltrates suggestive of infection versus edema.  She was noted to have wheezing.  The patient was started on IV Lasix, IV antibiotics and IV steroids.  Principal Problem:   Acute respiratory failure with hypoxia (HCC)   (HFpEF) heart failure with preserved ejection fraction (HCC)   Cor, pulmonale, acute (HCC) -May have also had an element of asthma/COPD exacerbation-has been tapered to prednisone -2D echo reveals indeterminate diastolic filling due to E-A fusion (possible diastolic dysfunction), RV volume overload with moderately reduced systolic function and severe RV enlargement, & moderately elevated pulmonary artery systolic pressure -She has received IV Lasix and has diuresed about 10 pounds - She has been  extensively educated about heart failure, weight loss and dietary changes - Adding losartan today and discharging with oral Lasix twice daily - She already has a cardiologist and plans to follow-up with him - We have also discussed her recent finding of extensive emphysema-see below - I have advised that she continue to wear her CPAP regularly to prevent further right heart failure  Active Problems: Emphysema - I have reviewed her outpatient visit with the pulmonologist - It appears that ANA is positive with a speckled pattern, I have discussed this with her-she has no history of autoimmune illness in the family and has no other signs or symptoms of autoimmune illness -I have recommended a close follow-up with pulmonary to discuss these findings and decide if further workup and treatment is necessary  Mediastinal "masslike opacity" - This was noted on high-resolution CT that was recently ordered by pulmonary - Recommend outpatient follow-up    Chronic low back pain with left-sided sciatica   Class 3 severe obesity due to excess calories with serious comorbidity and body mass index (BMI) of 40.0 to 44.9 in adult [facility]) -She is actively working on weight loss and has already lost 20 pounds    Hyperlipidemia   Anxiety   Mild episode of recurrent major depressive disorder (HCC)   OSA (obstructive sleep apnea)           Discharge Instructions  Discharge Instructions     Ambulatory referral to Gastroenterology   Complete by: As directed    What is the reason for referral?: Other Comment - paraesophageal lesion/mass   Diet - low sodium heart healthy   Complete by: As directed  Increase activity slowly   Complete by: As directed       Allergies as of 03/17/2024       Reactions   Oxycodone Hives        Medication List     TAKE these medications    furosemide 20 MG tablet Commonly known as: Lasix Take 1 tablet (20 mg total) by mouth 2 (two) times daily.   Iron  (Ferrous Sulfate) 325 (65 Fe) MG Tabs Take 325 mg by mouth daily.   losartan 25 MG tablet Commonly known as: Cozaar Take 1 tablet (25 mg total) by mouth daily.   potassium chloride SA 20 MEQ tablet Commonly known as: KLOR-CON M Take 0.5 tablets (10 mEq total) by mouth daily.   predniSONE 20 MG tablet Commonly known as: DELTASONE Take 2 tablets (40 mg total) by mouth daily with breakfast for 4 days. Start taking on: March 18, 2024   Spiriva Respimat 2.5 MCG/ACT Aers Generic drug: Tiotropium Bromide Monohydrate Inhale 2 puffs into the lungs daily.   VITAMIN D PO Take 1 tablet by mouth daily at 12 noon.   Wixela Inhub 100-50 MCG/ACT Aepb Generic drug: fluticasone-salmeterol INHALE 1 DOSE BY MOUTH TWICE A DAY What changed: See the new instructions.        Follow-up Information     Berniece Salines, FNP Follow up.   Specialty: Nurse Practitioner Why: Bmet, Magnesium Contact information: [address] [place] [address] [phone]                    The results of significant diagnostics from this hospitalization (including imaging, microbiology, ancillary and laboratory) are listed below for reference.    ECHOCARDIOGRAM COMPLETE Result Date: 03/12/2024    ECHOCARDIOGRAM REPORT   Patient Name:   Virginia Camacho Date of Exam: 03/12/2024 Medical Rec #:  [id number]        Height:       63.0 in Accession #:    [id number]       Weight:       240.6 lb Date of Birth:  04/12/1981        BSA:          2.092 m Patient Age:    42 years         BP:           147/89 mmHg Patient Gender: F                HR:           109 bpm. Exam Location:  Inpatient Procedure: 2D Echo, Cardiac Doppler and Color Doppler (Both Spectral and Color            Flow Doppler were utilized during procedure). Indications:    I50.31 Acute diastolic (congestive) heart failure  History:        Patient has no prior history of Echocardiogram examinations.                  Signs/Symptoms:Shortness of Breath.  Sonographer:    Maxwell Marion Referring Phys: [phone] JESSICA U VANN IMPRESSIONS  1. Left ventricular ejection fraction, by estimation, is 55%. The left ventricle has normal function. The left ventricle has no regional wall motion abnormalities. Indeterminate diastolic filling due to E-A fusion.  2. D-shaped septum suggestive of RV pressure/volume overload. Right ventricular systolic function is moderately reduced. The right ventricular size is severely enlarged. There is moderately elevated pulmonary artery systolic pressure. The estimated  right ventricular systolic pressure is 52.1 mmHg.  3. Right atrial size was mildly dilated.  4. The mitral valve is normal in structure. No evidence of mitral valve regurgitation. No evidence of mitral stenosis.  5. The aortic valve is tricuspid. Aortic valve regurgitation is not visualized. No aortic stenosis is present.  6. The inferior vena cava is dilated in size with >50% respiratory variability, suggesting right atrial pressure of 8 mmHg. FINDINGS  Left Ventricle: Left ventricular ejection fraction, by estimation, is 55%. The left ventricle has normal function. The left ventricle has no regional wall motion abnormalities. Definity contrast agent was given IV to delineate the left ventricular endocardial borders. The left ventricular internal cavity size was normal in size. There is no left ventricular hypertrophy. Indeterminate diastolic filling due to E-A fusion. Right Ventricle: D-shaped septum suggestive of RV pressure/volume overload. The right ventricular size is severely enlarged. No increase in right ventricular wall thickness. Right ventricular systolic function is moderately reduced. There is moderately elevated pulmonary artery systolic pressure. The tricuspid regurgitant velocity is 3.32 m/s, and with an assumed right atrial pressure of 8 mmHg, the estimated right ventricular systolic pressure is 52.1 mmHg. Left Atrium: Left atrial  size was normal in size. Right Atrium: Right atrial size was mildly dilated. Pericardium: There is no evidence of pericardial effusion. Mitral Valve: The mitral valve is normal in structure. No evidence of mitral valve regurgitation. No evidence of mitral valve stenosis. Tricuspid Valve: The tricuspid valve is normal in structure. Tricuspid valve regurgitation is mild. Aortic Valve: The aortic valve is tricuspid. Aortic valve regurgitation is not visualized. No aortic stenosis is present. Aortic valve mean gradient measures 2.0 mmHg. Aortic valve peak gradient measures 3.9 mmHg. Aortic valve area, by VTI measures 2.24 cm. Pulmonic Valve: The pulmonic valve was normal in structure. Pulmonic valve regurgitation is trivial. Aorta: The aortic root is normal in size and structure. Venous: The inferior vena cava is dilated in size with greater than 50% respiratory variability, suggesting right atrial pressure of 8 mmHg. IAS/Shunts: No atrial level shunt detected by color flow Doppler.  LEFT VENTRICLE PLAX 2D LVIDd:         3.80 cm     Diastology LVIDs:         2.80 cm     LV e' medial: 13.70 cm/s LV PW:         1.10 cm LV IVS:        1.20 cm LVOT diam:     1.90 cm LV SV:         34 LV SV Index:   16 LVOT Area:     2.84 cm  LV Volumes (MOD) LV vol d, MOD A2C: 92.7 ml LV vol d, MOD A4C: 74.4 ml LV vol s, MOD A2C: 39.9 ml LV vol s, MOD A4C: 41.6 ml LV SV MOD A2C:     52.8 ml LV SV MOD A4C:     74.4 ml LV SV MOD BP:      42.5 ml RIGHT VENTRICLE RV S prime:     8.81 cm/s TAPSE (M-mode): 1.6 cm LEFT ATRIUM             Index LA Vol (A2C):   29.0 ml 13.86 ml/m LA Vol (A4C):   14.6 ml 6.98 ml/m LA Biplane Vol: 21.6 ml 10.33 ml/m  AORTIC VALVE AV Area (Vmax):    2.29 cm AV Area (Vmean):   1.94 cm AV Area (VTI):     2.24 cm  AV Vmax:           99.10 cm/s AV Vmean:          68.600 cm/s AV VTI:            0.152 m AV Peak Grad:      3.9 mmHg AV Mean Grad:      2.0 mmHg LVOT Vmax:         80.20 cm/s LVOT Vmean:        46.900 cm/s  LVOT VTI:          0.120 m LVOT/AV VTI ratio: 0.79  AORTA Ao Asc diam: 3.50 cm TRICUSPID VALVE TR Peak grad:   44.1 mmHg TR Vmax:        332.00 cm/s  SHUNTS Systemic VTI:  0.12 m Systemic Diam: 1.90 cm Dalton McleanMD Electronically signed by Wilfred Lacy Signature Date/Time: 03/12/2024/12:31:55 PM    Final    CT Angio Chest PE W and/or Wo Contrast Result Date: 03/11/2024 CLINICAL DATA:  Letter intermediate probably pulmonary embolism, positive D-dimer, dyspnea, hypoxia EXAM: CT ANGIOGRAPHY CHEST WITH CONTRAST TECHNIQUE: Multidetector CT imaging of the chest was performed using the standard protocol during bolus administration of intravenous contrast. Multiplanar CT image reconstructions and MIPs were obtained to evaluate the vascular anatomy. RADIATION DOSE REDUCTION: This exam was performed according to the departmental dose-optimization program which includes automated exposure control, adjustment of the mA and/or kV according to patient size and/or use of iterative reconstruction technique. CONTRAST:  OMNIPAQUE IOHEXOL 350 MG/ML SOLN COMPARISON:  02/23/2024 FINDINGS: Cardiovascular: Adequate opacification of the pulmonary arterial tree. No intraluminal filling defect identified to suggest acute pulmonary embolism. Central pulmonary arteries are of normal caliber. No significant coronary artery calcification. Cardiac size within normal limits. No pericardial effusion. No significant atherosclerotic calcification within the thoracic aorta. No aortic aneurysm. 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 This is not well characterized on this examination due to phase of contrast enhancement. As noted previously, this may represent a cystic collection such as a foregut duplication cyst though a esophageal mass such as a leiomyoma or lymphadenopathy is not completely excluded on this limited examination. Visualized  thyroid is unremarkable. The esophagus is otherwise unremarkable. Lungs/Pleura: Severe centrilobular emphysema is again identified. There has developed superimposed diffuse interstitial infiltrate with more extensive consolidation within the right lower lobe most in keeping with acute infection. A component of superimposed edema is difficult to exclude. No pneumothorax or pleural effusion. No central obstructing lesion. Upper Abdomen: No acute abnormality. Musculoskeletal: No chest wall abnormality. No acute or significant osseous findings. Review of the MIP images confirms the above findings. IMPRESSION: 1. No pulmonary embolism. 2. Interval development of diffuse interstitial infiltrate with more extensive consolidation within the right lower lobe most in keeping with acute infection. A component of superimposed edema is difficult to exclude. 3. Stable retrocardiac, paraesophageal low-attenuation mass measuring at least 5.0 x 8.5 x 10.3 cm in greatest dimension. This is not well characterized on this examination due to phase of contrast enhancement. As noted previously, this may represent a cystic collection such as a foregut duplication cyst though a esophageal mass such as a leiomyoma or lymphadenopathy is not completely excluded on this limited examination. If indicated, this could be better assessed with contrast enhanced MRI examination or endoscopic ultrasound. Emphysema (ICD10-J43.9). Electronically Signed   By: Helyn Numbers M.D.   On: 03/11/2024 20:00   CT CHEST HIGH RESOLUTION  Result Date: 03/11/2024 CLINICAL DATA:  Shortness of breath. EXAM: CT CHEST WITHOUT CONTRAST TECHNIQUE: Multidetector CT imaging of the chest was performed following the standard protocol without intravenous contrast. High resolution imaging of the lungs, as well as inspiratory and expiratory imaging, was performed. RADIATION DOSE REDUCTION: This exam was performed according to the departmental dose-optimization program which  includes automated exposure control, adjustment of the mA and/or kV according to patient size and/or use of iterative reconstruction technique. COMPARISON:  None Available. FINDINGS: Cardiovascular: No significant vascular findings. Normal heart size. No pericardial effusion. Mediastinum/Nodes: Lower posterior mediastinal mass like opacity identified measuring 6.2 x 5.3 x 11.0 cm. This is seen to the right of the distal esophagus. Esophageal involvement can not be excluded. This is low-density measuring 3 Hounsfield units. Upper esophagus is nondilated. Enlarged paraesophageal lymph node measures 11 mm short axis. Nonenlarged prevascular lymph nodes are seen. Visualized thyroid gland is within normal limits. Lungs/Pleura: There is moderate severe diffuse centrilobular emphysema involving both lungs. There is a small amount of atelectasis in the inferior right lower lobe. There is a nodular density in the inferior right lower lobe measuring 4 mm image 4/243. There some right lower lobe peribronchial wall thickening. There is no bronchiectasis, ground-glass opacity or interstitial opacities. Micro nodules measuring less than 2 mm are clustered in the left lung apex. No areas of air trapping. No pleural effusion or pneumothorax. Trachea appears normal. Upper Abdomen: No acute abnormality. There is colonic diverticulosis. Musculoskeletal: No chest wall mass or suspicious bone lesions identified. IMPRESSION: 1. Lower posterior mediastinal mass like opacity measuring 6.2 x 5.3 x 11.0 cm. This is seen to the right of the distal esophagus. Esophageal involvement can not be excluded. This is low-density and favored as cystic, but incompletely evaluated on this noncontrast study. 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 2. Single enlarged paraesophageal lymph node measuring 11 mm short axis. 3. 4 mm nodular density in the inferior right lower lobe. 4. Right lower lobe  peribronchial wall thickening worrisome for bronchitis. 5. Moderate to severe diffuse centrilobular emphysema. 6. Colonic diverticulosis. Emphysema (ICD10-J43.9). Electronically Signed   By: Darliss Cheney M.D.   On: 03/11/2024 18:39   DG Chest Portable 1 View Result Date: 03/11/2024 CLINICAL DATA:  Shortness of breath EXAM: PORTABLE CHEST 1 VIEW COMPARISON:  Chest x-ray 02/09/2024.  Chest CT 02/23/2024. FINDINGS: Heart size is within normal limits. Lower mediastinal masslike opacity appears unchanged from prior CT. Diffuse emphysematous changes are again noted. Both lungs are clear. The visualized skeletal structures are unremarkable. IMPRESSION: No active disease. Electronically Signed   By: Darliss Cheney M.D.   On: 03/11/2024 18:31   Labs:   Basic Metabolic Panel: Recent Labs  Lab 03/12/24 0405 03/13/24 0406 03/14/24 0404 03/15/24 0352 03/16/24 0403  NA 137 138 137 136 137  K 4.0 4.9 5.0 4.9 4.1  CL 108 107 106 102 105  CO2 16* 18* 22 24 24   GLUCOSE 156* 111* 116* 107* 92  BUN 14 21* 27* 26* 24*  CREATININE 0.87 1.01* 0.99 0.89 0.78  CALCIUM 9.2 9.6 9.0 9.5 8.9     CBC: Recent Labs  Lab 03/11/24 1751 03/12/24 0405 03/13/24 0406 03/15/24 0352 03/16/24 0403  WBC 6.3 5.7 12.5* 10.7* 8.6  NEUTROABS 5.6 4.8  --   --   --   HGB 16.3* 16.3* 16.1* 17.1* 15.2*  HCT 51.2* 51.7* 53.2* 56.0* 49.4*  MCV 75.7* 77.5* 80.2 80.3 79.3*  PLT 310 318 308 315 254         SIGNED:   Calvert Cantor, MD  [facility] 03/17/2024, 2:00 PM Time taking on discharge: 50 minutes

## 2024-03-17 NOTE — Progress Notes (Signed)
Patient discharged: Home with family  Via: Wheelchair   Discharge paperwork given: to patient and family  Reviewed with teach back  IV and telemetry disconnected  Belongings given to patient    

## 2024-03-17 NOTE — Progress Notes (Signed)
 PT is discharging and has packed Flutter- states she has been using regularly. Discussed continuing to use Flutter and IS until she is back to baseline with daily activity.

## 2024-03-17 NOTE — Progress Notes (Signed)
 Discharge medications delivered to bedside [facility]

## 2024-03-18 NOTE — Progress Notes (Unsigned)
 LMP 02/24/2024    Subjective:    Patient ID: Virginia Camacho, female    DOB: [birth year], 43 y.o.   MRN: [id number]  HPI: Virginia Camacho is a 43 y.o. female  No chief complaint on file.   Discussed the use of AI scribe software for clinical note transcription with the patient, who gave verbal consent to proceed.  History of Present Illness          03/11/2024    2:51 PM 10/22/2023    3:11 PM 04/21/2023   10:08 AM  Depression screen PHQ 2/9  Decreased Interest 0 0 0  Down, Depressed, Hopeless 0  0  PHQ - 2 Score 0 0 0  Altered sleeping 1  0  Tired, decreased energy 1  0  Change in appetite 1  0  Feeling bad or failure about yourself  0  0  Trouble concentrating 0  0  Moving slowly or fidgety/restless 0  0  Suicidal thoughts 0  0  PHQ-9 Score 3  0  Difficult doing work/chores Not difficult at all  Not difficult at all    Relevant past medical, surgical, family and social history reviewed and updated as indicated. Interim medical history since our last visit reviewed. Allergies and medications reviewed and updated.  Review of Systems  Per HPI unless specifically indicated above     Objective:    LMP 02/24/2024   {Vitals History (Optional):23777} Wt Readings from Last 3 Encounters:  03/17/24 237 lb 11.2 oz (107.8 kg)  03/11/24 240 lb 9.6 oz (109.1 kg)  02/23/24 236 lb 9.6 oz (107.3 kg)    Physical Exam Physical Exam    Results for orders placed or performed during the hospital encounter of 03/11/24  Resp panel by RT-PCR (RSV, Flu A&B, Covid) Anterior Nasal Swab   Collection Time: 03/11/24  5:50 PM   Specimen: Anterior Nasal Swab  Result Value Ref Range   SARS Coronavirus 2 by RT PCR NEGATIVE NEGATIVE   Influenza A by PCR NEGATIVE NEGATIVE   Influenza B by PCR NEGATIVE NEGATIVE   Resp Syncytial Virus by PCR NEGATIVE NEGATIVE  Brain natriuretic peptide   Collection Time: 03/11/24  5:50 PM  Result Value Ref Range   B Natriuretic Peptide 165.4 (H) 0.0  - 100.0 pg/mL  CBC with Differential   Collection Time: 03/11/24  5:51 PM  Result Value Ref Range   WBC 6.3 4.0 - 10.5 K/uL   RBC 6.76 (H) 3.87 - 5.11 MIL/uL   Hemoglobin 16.3 (H) 12.0 - 15.0 g/dL   HCT 78.2 (H) 95.6 - 21.3 %   MCV 75.7 (L) 80.0 - 100.0 fL   MCH 24.1 (L) 26.0 - 34.0 pg   MCHC 31.8 30.0 - 36.0 g/dL   RDW 08.6 (H) 57.8 - 46.9 %   Platelets 310 150 - 400 K/uL   nRBC 0.0 0.0 - 0.2 %   Neutrophils Relative % 88 %   Neutro Abs 5.6 1.7 - 7.7 K/uL   Lymphocytes Relative 9 %   Lymphs Abs 0.6 (L) 0.7 - 4.0 K/uL   Monocytes Relative 2 %   Monocytes Absolute 0.1 0.1 - 1.0 K/uL   Eosinophils Relative 0 %   Eosinophils Absolute 0.0 0.0 - 0.5 K/uL   Basophils Relative 1 %   Basophils Absolute 0.0 0.0 - 0.1 K/uL   Immature Granulocytes 0 %   Abs Immature Granulocytes 0.01 0.00 - 0.07 K/uL  Basic metabolic panel   Collection Time: 03/11/24  5:51 PM  Result Value Ref Range   Sodium 135 135 - 145 mmol/L   Potassium 5.3 (H) 3.5 - 5.1 mmol/L   Chloride 103 98 - 111 mmol/L   CO2 19 (L) 22 - 32 mmol/L   Glucose, Bld 98 70 - 99 mg/dL   BUN 16 6 - 20 mg/dL   Creatinine, Ser 1.61 0.44 - 1.00 mg/dL   Calcium 9.3 8.9 - 09.6 mg/dL   GFR, Estimated >04 >54 mL/min   Anion gap 13 5 - 15  Troponin I (High Sensitivity)   Collection Time: 03/11/24  5:51 PM  Result Value Ref Range   Troponin I (High Sensitivity) 22 (H) <18 ng/L  Blood culture (routine x 2)   Collection Time: 03/11/24  6:00 PM   Specimen: BLOOD RIGHT FOREARM  Result Value Ref Range   Specimen Description      BLOOD RIGHT FOREARM Performed at [facility], [address] [place], [place] [address]    Special Requests      BOTTLES DRAWN AEROBIC AND ANAEROBIC Blood Culture adequate volume Performed at [facility], [address] [place], [place] [address]    Culture      NO GROWTH 5 DAYS Performed at [facility], [address] [place], [place] [address]    Report  Status 03/17/2024 FINAL   Troponin I (High Sensitivity)   Collection Time: 03/11/24  8:11 PM  Result Value Ref Range   Troponin I (High Sensitivity) 21 (H) <18 ng/L  Blood culture (routine x 2)   Collection Time: 03/11/24  9:45 PM   Specimen: BLOOD  Result Value Ref Range   Specimen Description      BLOOD LEFT ANTECUBITAL Performed at [facility], [address], [place], [place] [address]    Special Requests      BOTTLES DRAWN AEROBIC AND ANAEROBIC Blood Culture adequate volume Performed at [facility], [address], [place], [place] [address]    Culture      NO GROWTH 5 DAYS Performed at [facility], [address], [place], [place] [address]    Report Status 03/17/2024 FINAL   Comprehensive metabolic panel   Collection Time: 03/12/24  4:05 AM  Result Value Ref Range   Sodium 137 135 - 145 mmol/L   Potassium 4.0 3.5 - 5.1 mmol/L   Chloride 108 98 - 111 mmol/L   CO2 16 (L) 22 - 32 mmol/L   Glucose, Bld 156 (H) 70 - 99 mg/dL   BUN 14 6 - 20 mg/dL   Creatinine, Ser 2.84 0.44 - 1.00 mg/dL   Calcium 9.2 8.9 - 13.2 mg/dL   Total Protein 7.2 6.5 - 8.1 g/dL   Albumin 3.5 3.5 - 5.0 g/dL   AST 32 15 - 41 U/L   ALT 47 (H) 0 - 44 U/L   Alkaline Phosphatase 33 (L) 38 - 126 U/L   Total Bilirubin 1.4 (H) 0.0 - 1.2 mg/dL   GFR, Estimated >44 >01 mL/min   Anion gap 13 5 - 15  CBC with Differential/Platelet   Collection Time: 03/12/24  4:05 AM  Result Value Ref Range   WBC 5.7 4.0 - 10.5 K/uL   RBC 6.67 (H) 3.87 - 5.11 MIL/uL   Hemoglobin 16.3 (H) 12.0 - 15.0 g/dL   HCT 02.7 (H) 25.3 - 66.4 %   MCV 77.5 (L) 80.0 - 100.0 fL   MCH 24.4 (L) 26.0 - 34.0 pg   MCHC  31.5 30.0 - 36.0 g/dL   RDW 46.9 (H) 62.9 - 52.8 %   Platelets 318 150 - 400 K/uL   nRBC 0.0 0.0 - 0.2 %   Neutrophils Relative % 85 %   Neutro Abs 4.8 1.7 - 7.7 K/uL   Lymphocytes Relative 13 %   Lymphs Abs 0.7 0.7 - 4.0 K/uL   Monocytes Relative 2 %   Monocytes  Absolute 0.1 0.1 - 1.0 K/uL   Eosinophils Relative 0 %   Eosinophils Absolute 0.0 0.0 - 0.5 K/uL   Basophils Relative 0 %   Basophils Absolute 0.0 0.0 - 0.1 K/uL   Immature Granulocytes 0 %   Abs Immature Granulocytes 0.01 0.00 - 0.07 K/uL  HIV Antibody (routine testing w rflx)   Collection Time: 03/12/24  4:05 AM  Result Value Ref Range   HIV Screen 4th Generation wRfx Non Reactive Non Reactive  ECHOCARDIOGRAM COMPLETE   Collection Time: 03/12/24 12:22 PM  Result Value Ref Range   Weight 3,849.6 oz   Height 63 in   BP 147/89 mmHg   Single Plane A2C EF 57.0 %   Single Plane A4C EF 44.1 %   Calc EF 50.6 %   S' Lateral 2.80 cm   AR max vel 2.29 cm2   AV Area VTI 2.24 cm2   AV Mean grad 2.0 mmHg   AV Peak grad 3.9 mmHg   Ao pk vel 0.99 m/s   AV Area mean vel 1.94 cm2   Est EF 55   Glucose, capillary   Collection Time: 03/12/24  4:41 PM  Result Value Ref Range   Glucose-Capillary 125 (H) 70 - 99 mg/dL  CBC   Collection Time: 03/13/24  4:06 AM  Result Value Ref Range   WBC 12.5 (H) 4.0 - 10.5 K/uL   RBC 6.63 (H) 3.87 - 5.11 MIL/uL   Hemoglobin 16.1 (H) 12.0 - 15.0 g/dL   HCT 41.3 (H) 24.4 - 01.0 %   MCV 80.2 80.0 - 100.0 fL   MCH 24.3 (L) 26.0 - 34.0 pg   MCHC 30.3 30.0 - 36.0 g/dL   RDW 27.2 (H) 53.6 - 64.4 %   Platelets 308 150 - 400 K/uL   nRBC 0.0 0.0 - 0.2 %  Basic metabolic panel   Collection Time: 03/13/24  4:06 AM  Result Value Ref Range   Sodium 138 135 - 145 mmol/L   Potassium 4.9 3.5 - 5.1 mmol/L   Chloride 107 98 - 111 mmol/L   CO2 18 (L) 22 - 32 mmol/L   Glucose, Bld 111 (H) 70 - 99 mg/dL   BUN 21 (H) 6 - 20 mg/dL   Creatinine, Ser 0.34 (H) 0.44 - 1.00 mg/dL   Calcium 9.6 8.9 - 74.2 mg/dL   GFR, Estimated >59 >56 mL/min   Anion gap 13 5 - 15  Basic metabolic panel   Collection Time: 03/14/24  4:04 AM  Result Value Ref Range   Sodium 137 135 - 145 mmol/L   Potassium 5.0 3.5 - 5.1 mmol/L   Chloride 106 98 - 111 mmol/L   CO2 22 22 - 32 mmol/L    Glucose, Bld 116 (H) 70 - 99 mg/dL   BUN 27 (H) 6 - 20 mg/dL   Creatinine, Ser 3.87 0.44 - 1.00 mg/dL   Calcium 9.0 8.9 - 56.4 mg/dL   GFR, Estimated >33 >29 mL/min   Anion gap 9 5 - 15  CBC   Collection Time: 03/15/24  3:52 AM  Result Value Ref Range   WBC 10.7 (H) 4.0 - 10.5 K/uL   RBC 6.97 (H) 3.87 - 5.11 MIL/uL   Hemoglobin 17.1 (H) 12.0 - 15.0 g/dL   HCT 16.1 (H) 09.6 - 04.5 %   MCV 80.3 80.0 - 100.0 fL   MCH 24.5 (L) 26.0 - 34.0 pg   MCHC 30.5 30.0 - 36.0 g/dL   RDW 40.9 (H) 81.1 - 91.4 %   Platelets 315 150 - 400 K/uL   nRBC 0.0 0.0 - 0.2 %  Basic metabolic panel   Collection Time: 03/15/24  3:52 AM  Result Value Ref Range   Sodium 136 135 - 145 mmol/L   Potassium 4.9 3.5 - 5.1 mmol/L   Chloride 102 98 - 111 mmol/L   CO2 24 22 - 32 mmol/L   Glucose, Bld 107 (H) 70 - 99 mg/dL   BUN 26 (H) 6 - 20 mg/dL   Creatinine, Ser 7.82 0.44 - 1.00 mg/dL   Calcium 9.5 8.9 - 95.6 mg/dL   GFR, Estimated >21 >30 mL/min   Anion gap 10 5 - 15  Brain natriuretic peptide   Collection Time: 03/16/24  4:03 AM  Result Value Ref Range   B Natriuretic Peptide 35.9 0.0 - 100.0 pg/mL  CBC   Collection Time: 03/16/24  4:03 AM  Result Value Ref Range   WBC 8.6 4.0 - 10.5 K/uL   RBC 6.23 (H) 3.87 - 5.11 MIL/uL   Hemoglobin 15.2 (H) 12.0 - 15.0 g/dL   HCT 86.5 (H) 78.4 - 69.6 %   MCV 79.3 (L) 80.0 - 100.0 fL   MCH 24.4 (L) 26.0 - 34.0 pg   MCHC 30.8 30.0 - 36.0 g/dL   RDW 29.5 (H) 28.4 - 13.2 %   Platelets 254 150 - 400 K/uL   nRBC 0.0 0.0 - 0.2 %  Basic metabolic panel   Collection Time: 03/16/24  4:03 AM  Result Value Ref Range   Sodium 137 135 - 145 mmol/L   Potassium 4.1 3.5 - 5.1 mmol/L   Chloride 105 98 - 111 mmol/L   CO2 24 22 - 32 mmol/L   Glucose, Bld 92 70 - 99 mg/dL   BUN 24 (H) 6 - 20 mg/dL   Creatinine, Ser 4.40 0.44 - 1.00 mg/dL   Calcium 8.9 8.9 - 10.2 mg/dL   GFR, Estimated >72 >53 mL/min   Anion gap 8 5 - 15   {Labs (Optional):23779}    Assessment & Plan:    Problem List Items Addressed This Visit   None    Assessment and Plan Assessment & Plan         Follow up plan: No follow-ups on file.

## 2024-03-19 ENCOUNTER — Encounter: Payer: Self-pay | Admitting: Nurse Practitioner

## 2024-03-19 ENCOUNTER — Ambulatory Visit (INDEPENDENT_AMBULATORY_CARE_PROVIDER_SITE_OTHER): Admitting: Nurse Practitioner

## 2024-03-19 ENCOUNTER — Encounter: Payer: Self-pay | Admitting: Obstetrics & Gynecology

## 2024-03-19 VITALS — BP 124/76 | HR 98 | Resp 18 | Ht 63.0 in | Wt 236.7 lb

## 2024-03-19 DIAGNOSIS — F419 Anxiety disorder, unspecified: Secondary | ICD-10-CM

## 2024-03-19 DIAGNOSIS — I5030 Unspecified diastolic (congestive) heart failure: Secondary | ICD-10-CM

## 2024-03-19 DIAGNOSIS — J449 Chronic obstructive pulmonary disease, unspecified: Secondary | ICD-10-CM | POA: Insufficient documentation

## 2024-03-19 DIAGNOSIS — Z1231 Encounter for screening mammogram for malignant neoplasm of breast: Secondary | ICD-10-CM | POA: Diagnosis not present

## 2024-03-19 DIAGNOSIS — J9859 Other diseases of mediastinum, not elsewhere classified: Secondary | ICD-10-CM

## 2024-03-19 DIAGNOSIS — Z09 Encounter for follow-up examination after completed treatment for conditions other than malignant neoplasm: Secondary | ICD-10-CM | POA: Diagnosis not present

## 2024-03-19 DIAGNOSIS — J441 Chronic obstructive pulmonary disease with (acute) exacerbation: Secondary | ICD-10-CM | POA: Insufficient documentation

## 2024-03-19 MED ORDER — ESCITALOPRAM OXALATE 10 MG PO TABS: 10.0000 mg | ORAL_TABLET | Freq: Every day | ORAL | 0 refills | Status: DC

## 2024-03-20 ENCOUNTER — Encounter: Payer: Self-pay | Admitting: Nurse Practitioner

## 2024-03-20 LAB — CBC WITH DIFFERENTIAL/PLATELET
Absolute Lymphocytes: 2026 {cells}/uL (ref 850–3900)
Absolute Monocytes: 363 {cells}/uL (ref 200–950)
Basophils Absolute: 20 {cells}/uL (ref 0–200)
Basophils Relative: 0.3 %
Eosinophils Absolute: 92 {cells}/uL (ref 15–500)
Eosinophils Relative: 1.4 %
HCT: 53.9 % — ABNORMAL HIGH (ref 35.0–45.0)
Hemoglobin: 16.6 g/dL — ABNORMAL HIGH (ref 11.7–15.5)
MCH: 24.1 pg — ABNORMAL LOW (ref 27.0–33.0)
MCHC: 30.8 g/dL — ABNORMAL LOW (ref 32.0–36.0)
MCV: 78.1 fL — ABNORMAL LOW (ref 80.0–100.0)
MPV: 10.9 fL (ref 7.5–12.5)
Monocytes Relative: 5.5 %
Neutro Abs: 4099 {cells}/uL (ref 1500–7800)
Neutrophils Relative %: 62.1 %
Platelets: 324 10*3/uL (ref 140–400)
RBC: 6.9 10*6/uL — ABNORMAL HIGH (ref 3.80–5.10)
RDW: 15.5 % — ABNORMAL HIGH (ref 11.0–15.0)
Total Lymphocyte: 30.7 %
WBC: 6.6 10*3/uL (ref 3.8–10.8)

## 2024-03-20 LAB — COMPREHENSIVE METABOLIC PANEL WITH GFR
AG Ratio: 1.4 (calc) (ref 1.0–2.5)
ALT: 22 U/L (ref 6–29)
AST: 14 U/L (ref 10–30)
Albumin: 3.7 g/dL (ref 3.6–5.1)
Alkaline phosphatase (APISO): 30 U/L — ABNORMAL LOW (ref 31–125)
BUN/Creatinine Ratio: 24 (calc) — ABNORMAL HIGH (ref 6–22)
BUN: 28 mg/dL — ABNORMAL HIGH (ref 7–25)
CO2: 30 mmol/L (ref 20–32)
Calcium: 9.8 mg/dL (ref 8.6–10.2)
Chloride: 99 mmol/L (ref 98–110)
Creat: 1.15 mg/dL — ABNORMAL HIGH (ref 0.50–0.99)
Globulin: 2.7 g/dL (ref 1.9–3.7)
Glucose, Bld: 85 mg/dL (ref 65–99)
Potassium: 4.7 mmol/L (ref 3.5–5.3)
Sodium: 138 mmol/L (ref 135–146)
Total Bilirubin: 0.8 mg/dL (ref 0.2–1.2)
Total Protein: 6.4 g/dL (ref 6.1–8.1)
eGFR: 61 mL/min/{1.73_m2} (ref >=60)

## 2024-03-20 LAB — BRAIN NATRIURETIC PEPTIDE: Brain Natriuretic Peptide: 53 pg/mL (ref <100)

## 2024-03-24 ENCOUNTER — Encounter: Payer: Self-pay | Admitting: Cardiology

## 2024-03-24 ENCOUNTER — Ambulatory Visit: Attending: Cardiology | Admitting: Cardiology

## 2024-03-24 VITALS — BP 108/75 | HR 109 | Ht 64.0 in | Wt 229.2 lb

## 2024-03-24 DIAGNOSIS — I272 Pulmonary hypertension, unspecified: Secondary | ICD-10-CM

## 2024-03-24 DIAGNOSIS — I10 Essential (primary) hypertension: Secondary | ICD-10-CM

## 2024-03-24 DIAGNOSIS — I5030 Unspecified diastolic (congestive) heart failure: Secondary | ICD-10-CM | POA: Diagnosis not present

## 2024-03-24 DIAGNOSIS — G4733 Obstructive sleep apnea (adult) (pediatric): Secondary | ICD-10-CM

## 2024-03-24 DIAGNOSIS — R9389 Abnormal findings on diagnostic imaging of other specified body structures: Secondary | ICD-10-CM

## 2024-03-24 DIAGNOSIS — E7800 Pure hypercholesterolemia, unspecified: Secondary | ICD-10-CM

## 2024-03-24 MED ORDER — BISOPROLOL FUMARATE 5 MG PO TABS: 2.5000 mg | ORAL_TABLET | Freq: Every day | ORAL | 3 refills | Status: AC

## 2024-03-24 MED ORDER — FUROSEMIDE 20 MG PO TABS: 20.0000 mg | ORAL_TABLET | Freq: Every day | ORAL | 6 refills | Status: AC

## 2024-03-24 MED ORDER — LOSARTAN POTASSIUM 25 MG PO TABS: 12.5000 mg | ORAL_TABLET | Freq: Every day | ORAL | 6 refills | Status: AC

## 2024-03-24 NOTE — Patient Instructions (Addendum)
 Medication Instructions:  Your physician recommends the following medication changes.  START TAKING:  BISOPROLOL 2.5 MG Daily  DECREASE:  Lasix (furosemide) to daily Losartan (COZAAR) TO 12.5 MG Daily   *If you need a refill on your cardiac medications before your next appointment, please call your pharmacy*  Lab Work: No labs ordered today  If you have labs (blood work) drawn today and your tests are completely normal, you will receive your results only by: MyChart Message (if you have MyChart) OR A paper copy in the mail If you have any lab test that is abnormal or we need to change your treatment, we will call you to review the results.  Testing/Procedures: No test ordered today   Follow-Up: At [facility], you and your health needs are our priority.  As part of our continuing mission to provide you with exceptional heart care, our providers are all part of one team.  This team includes your primary Cardiologist (physician) and Advanced Practice Providers or APPs (Physician Assistants and Nurse Practitioners) who all work together to provide you with the care you need, when you need it.  Your next appointment:   4 week(s)  Provider:   You may see Constancia Delton, MD or one of the following Advanced Practice Providers on your designated Care Team:   Laneta Pintos, NP Gildardo Labrador, PA-C Varney Gentleman, PA-C Cadence Westcreek, PA-C Ronald Cockayne, NP Morey Ar, NP

## 2024-03-24 NOTE — Progress Notes (Signed)
 Cardiology Office Note:  .   Date:  03/24/2024  ID:  Virginia Camacho, DOB [birth year], MRN [id number] PCP: Berniece Salines, FNP  [facility] Providers Cardiologist:  Debbe Odea, MD    History of Present Illness: .   Virginia Camacho is a 43 y.o. female with a past medical history of hyperlipidemia, morbid obesity, former smoker x 15 years, atypical chest pain, asthma, emphysema, obstructive sleep apnea, chronic migraines, chronic back pain, who is here today for follow-up.  She was referred to cardiology for the evaluation of chest pain with no prior cardiac history and recurrent shortness of breath that been happening for the last year where she been evaluated and treated by pulmonary.  She had no documented chest pain with normal EKG at her PCP office and also complains of heart palpitations during exertion.  She was seen in clinic 06/18/2023 by Dr.Agbor-Etang.  She was scheduled for an echocardiogram and a coronary CTA.  Unfortunately neither of these tests were completed.  She presented to [facility] Emergency Department with complaints of shortness of breath on 03/11/2024.  She has history of asthma and recent diagnosis of emphysema.  She was noted to be at her PCPs office with oxygen saturations in the 70s and she was given albuterol and steroids and admitted for further evaluation.  She was treated for acute respiratory failure with hypoxia and HFpEF exacerbation.  2D echo revealed indeterminate diastolic filling, LVEF 55%, no RWMA,  RA volume overload mildly reduced systolic function with severe RV enlargement and moderately elevated pulmonary artery systolic pressure.  She was treated with IV Lasix and was diuresed about 10 pounds.  Losartan was added to her medication regimen along with oral furosemide twice daily.  She was educated and encouraged to continue with CPAP regularly to prevent further right heart failure.  She also had mediastinal masslike opacity noted on resolution CT  recently ordered by pulmonary with recommendation to continue with outpatient follow-up.  She was considered stable and discharged 03/17/24.  She returns to clinic today accompanied by her mother with several questions related to her hospitalization.  She is extremely nervous with concerns for heart failure with preserved ejection fraction and she was told that she had the hospital where she was diuresed and lost approximately 10 pounds.  She was started on goal-directed medical therapy with losartan and furosemide.  Her breathing has improved.  She denies any chest pain.  She has not been diligent about tracking her sodium intake, daily weights, and fluid intake.  She has followed up with her PCP and has had repeat labs completed and has an upcoming appointment with pulmonary.   ROS: 10 point review of system has been reviewed and considered negative except ones are listed in the HPI  Studies Reviewed: .       2D echo 03/12/2024 1. Left ventricular ejection fraction, by estimation, is 55%. The left  ventricle has normal function. The left ventricle has no regional wall  motion abnormalities. Indeterminate diastolic filling due to E-A fusion.   2. D-shaped septum suggestive of RV pressure/volume overload. Right  ventricular systolic function is moderately reduced. The right ventricular  size is severely enlarged. There is moderately elevated pulmonary artery  systolic pressure. The estimated  right ventricular systolic pressure is 52.1 mmHg.   3. Right atrial size was mildly dilated.   4. The mitral valve is normal in structure. No evidence of mitral valve  regurgitation. No evidence of mitral stenosis.   5.  The aortic valve is tricuspid. Aortic valve regurgitation is not  visualized. No aortic stenosis is present.   6. The inferior vena cava is dilated in size with >50% respiratory  variability, suggesting right atrial pressure of 8 mmHg.   Risk Assessment/Calculations:             Physical  Exam:   VS:  BP 108/75 (BP Location: Left Arm)   Pulse (!) 109   Ht 5\' 4"  (1.626 m)   Wt 229 lb 3.2 oz (104 kg)   LMP 02/24/2024   SpO2 93%   BMI 39.34 kg/m    Wt Readings from Last 3 Encounters:  03/24/24 229 lb 3.2 oz (104 kg)  03/19/24 236 lb 11.2 oz (107.4 kg)  03/17/24 237 lb 11.2 oz (107.8 kg)    GEN: Well nourished, well developed in no acute distress NECK: No JVD; No carotid bruits CARDIAC: RRR, no murmurs, rubs, gallops RESPIRATORY:  Clear to auscultation without rales, wheezing or rhonchi  ABDOMEN: Soft, non-tender, non-distended EXTREMITIES:  No edema; No deformity   ASSESSMENT AND PLAN: .   HFpEF with recent hospitalization for exacerbation.  She previously been treated for asthma and emphysema without resolution of shortness of breath.  Echocardiogram revealed an LVEF of 55% with with no RWMA, right ventricular systolic function is moderately reduced in size with severely enlarged, moderately elevated pulmonary artery systolic pressure with a right ventricular systolic pressure measuring 52.1 mmHg, no valvular abnormalities were noted.  She had a D-shaped septum concerning for overload CT of the chest was negative for PE.  She has been continued on losartan with a dose of dropped to 12.5 mg daily and started on bisoprolol 2.5 mg daily for beta-blocker component escalate GDMT.  Avoided Toprol-XL at this time with longstanding history of asthma.  Will need repeat BMP on return with slight bump in serum creatinine on labs at her PCPs office a few days ago suggestive of possible dehydration.  With furosemide was decreased to 20 mg daily versus twice daily.  Will consider initiation of MRA therapy or SGLT2 inhibitors on return.  Will also discuss further testing of modalities to rule out any ischemic causes on return.  Pulmonary hypertension there was noted to be moderately increased on recent echocardiogram.  Will repeat echocardiogram in several weeks to reevaluate function and  elevated pressures.  Could potentially benefit from being volume overloaded.  Hypertension with blood pressure today 108/75.  Blood pressures remain stable but is slightly soft today changes made to medication with losartan decreased to 12.5 mg daily, Lasix decreased to 1 dose daily and started on 2.5 mg of bisoprolol.  Encouraged to monitor pressure 1 to 2 hours postmedication administration as well.  Pure hypercholesterolemia with an LDL of 141.  Goal is less than 100.  lifestyle modification to reduce risk factors is recommended at this time.  10-year ASCVD risk is 1.2% in the left risk of CVD is 39%.    Obstructive sleep apnea compliant on CPAP.  Upcoming follow-up with pulmonary.  Abnormal chest CT with mediastinal masslike opacity noted on high-resolution CTA that was recently ordered by pulmonary.  Recommend she keep follow-up with pulmonary.  Morbid obesity with a BMI of 39.34.  Mixed prognosis difficult.  She would benefit from weight loss.  Encouraged to slowly increase her activity to rebuild her stamina.       Dispo: Patient return to clinic to see MD/APP in 4 weeks or sooner for reevaluation of symptoms after recent medication  changes.  Signed, Weltha Cathy, NP

## 2024-03-28 LAB — ALPHA-1-ANTITRYPSIN: A-1 Antitrypsin: 153 mg/dL (ref 101–187)

## 2024-03-28 LAB — ANA 12 PLUS PROFILE, POSITIVE
Anti-CCP Ab, IgG & IgA (RDL): <20 U (ref <20)
Anti-Cardiolipin Ab, IgA (RDL): <12 U/mL (ref <12)
Anti-Cardiolipin Ab, IgG (RDL): <15 GPL U/mL (ref <15)
Anti-Cardiolipin Ab, IgM (RDL): 18 [MPL'U]/mL (ref <13)
Anti-Centromere Ab (RDL): <1:40 {titer}
Anti-Chromatin Ab, IgG (RDL): <20 U (ref <20)
Anti-La (SS-B) Ab (RDL): <20 U (ref <20)
Anti-Ro (SS-A) Ab (RDL): <20 U (ref <20)
Anti-Scl-70 Ab (RDL): <20 U (ref <20)
Anti-Sm Ab (RDL): <20 U (ref <20)
Anti-TPO Ab (RDL): <9 [IU]/mL (ref <9.0)
Anti-U1 RNP Ab (RDL): <20 U (ref <20)
Anti-dsDNA Ab by Farr(RDL): <8 [IU]/mL (ref <8.0)
C3 Complement (RDL): 177 mg/dL (ref 90–180)
C4 Complement (RDL): 26 mg/dL (ref 10–40)
Rheumatoid Factor by Turb RDL: 20 [IU]/mL — ABNORMAL HIGH (ref <14)
Speckled Pattern: 1:160 {titer} — ABNORMAL HIGH

## 2024-03-28 LAB — ANA 12 PLUS PROFILE (RDL): Anti-Nuclear Ab by IFA (RDL): POSITIVE — AB

## 2024-04-01 ENCOUNTER — Telehealth: Admitting: Physician Assistant

## 2024-04-01 DIAGNOSIS — M25561 Pain in right knee: Secondary | ICD-10-CM

## 2024-04-01 MED ORDER — NAPROXEN 500 MG PO TABS: 500.0000 mg | ORAL_TABLET | Freq: Two times a day (BID) | ORAL | 0 refills | Status: DC

## 2024-04-01 NOTE — Progress Notes (Signed)
 Virtual Visit Consent   Virginia Camacho, you are scheduled for a virtual visit with a [facility] provider today. Just as with appointments in the office, your consent must be obtained to participate. Your consent will be active for this visit and any virtual visit you may have with one of our providers in the next 365 days. If you have a MyChart account, a copy of this consent can be sent to you electronically.  As this is a virtual visit, video technology does not allow for your provider to perform a traditional examination. This may limit your provider's ability to fully assess your condition. If your provider identifies any concerns that need to be evaluated in person or the need to arrange testing (such as labs, EKG, etc.), we will make arrangements to do so. Although advances in technology are sophisticated, we cannot ensure that it will always work on either your end or our end. If the connection with a video visit is poor, the visit may have to be switched to a telephone visit. With either a video or telephone visit, we are not always able to ensure that we have a secure connection.  By engaging in this virtual visit, you consent to the provision of healthcare and authorize for your insurance to be billed (if applicable) for the services provided during this visit. Depending on your insurance coverage, you may receive a charge related to this service.  I need to obtain your verbal consent now. Are you willing to proceed with your visit today? Virginia Camacho has provided verbal consent on 04/01/2024 for a virtual visit (video or telephone). Hyla Maillard, [place]  Date: 04/01/2024 12:12 PM   Virtual Visit via Video Note   I, Hyla Maillard, connected with  Virginia Camacho  ([id number], [birth year]) on 04/01/24 at 12:00 PM EDT by a video-enabled telemedicine application and verified that I am speaking with the correct person using two identifiers.  Location: Patient: Virtual Visit  Location Patient: Home Provider: Virtual Visit Location Provider: Home Office   I discussed the limitations of evaluation and management by telemedicine and the availability of in person appointments. The patient expressed understanding and agreed to proceed.    History of Present Illness: Virginia Camacho is a 43 y.o. who identifies as a female who was assigned female at birth, and is being seen today for R knee pain. Notes Tuesday night while sleeping and rolling over, her patella popped out and into place. Is double jointed and this has happened before but not during the night. Notes some mild swelling at base of patella with soreness. Denies bruising or other skin changes. Is able to bare weight and normal ROM. Has been icing and elevating.  Using an old brace now for extra stability. Has taken Tylenol  OTC. HPI: HPI  Problems:  Patient Active Problem List   Diagnosis Date Noted   COPD exacerbation (HCC) 03/19/2024   Acute respiratory failure with hypoxia (HCC) 03/17/2024   OSA (obstructive sleep apnea) 03/17/2024   (HFpEF) heart failure with preserved ejection fraction (HCC) 03/17/2024   Cor, pulmonale, acute (HCC) 03/17/2024   Moderate persistent asthma with acute exacerbation 03/11/2024   Centrilobular emphysema (HCC) 03/11/2024   Reactive airway disease 04/21/2023   Mild episode of recurrent major depressive disorder (HCC) 04/21/2023   Vitamin D  deficiency 04/21/2023   COVID-19 long hauler 10/21/2022   Iron  deficiency anemia 10/21/2022   Hyperlipidemia 06/14/2022   Anxiety 06/14/2022   Sleep apnea 05/31/2021  Class 3 severe obesity due to excess calories with serious comorbidity and body mass index (BMI) of 40.0 to 44.9 in adult [facility]) 01/25/2019   Breast asymmetry on examination 05/08/2017   Dermatitis 05/07/2017   Acute pain of left shoulder 03/20/2016   Well woman exam with routine gynecological exam 03/07/2016   Breast mass, right 03/07/2016   Degeneration of  intervertebral disc of lumbar region 12/20/2015   Chronic pain of right knee 12/20/2015   Headache, tension type, episodic 10/06/2015   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   Headache, migraine 10/06/2015    Allergies:  Allergies  Allergen Reactions   Oxycodone Hives   Medications:  Current Outpatient Medications:    naproxen  (NAPROSYN ) 500 MG tablet, Take 1 tablet (500 mg total) by mouth 2 (two) times daily with a meal., Disp: 20 tablet, Rfl: 0   bisoprolol  (ZEBETA ) 5 MG tablet, Take 0.5 tablets (2.5 mg total) by mouth daily., Disp: 45 tablet, Rfl: 3   escitalopram  (LEXAPRO ) 10 MG tablet, Take 1 tablet (10 mg total) by mouth daily., Disp: 30 tablet, Rfl: 0   furosemide  (LASIX ) 20 MG tablet, Take 1 tablet (20 mg total) by mouth daily., Disp: 60 tablet, Rfl: 6   losartan  (COZAAR ) 25 MG tablet, Take 0.5 tablets (12.5 mg total) by mouth daily., Disp: 30 tablet, Rfl: 6   potassium chloride  SA (KLOR-CON  M) 20 MEQ tablet, Take 0.5 tablets (10 mEq total) by mouth daily., Disp: 15 tablet, Rfl: 0   Tiotropium Bromide Monohydrate  (SPIRIVA  RESPIMAT) 2.5 MCG/ACT AERS, Inhale 2 puffs into the lungs daily., Disp: 60 each, Rfl: 12   WIXELA INHUB 100-50 MCG/ACT AEPB, INHALE 1 DOSE BY MOUTH TWICE A DAY (Patient taking differently: Inhale 1 puff into the lungs 2 (two) times daily.), Disp: 180 each, Rfl: 1  Current Facility-Administered Medications:    albuterol  (PROVENTIL ) (2.5 MG/3ML) 0.083% nebulizer solution 2.5 mg, 2.5 mg, Nebulization, Once, Quinton Buckler, FNP  Observations/Objective: Patient is well-developed, well-nourished in no acute distress.  Resting comfortably at home.  Head is normocephalic, atraumatic.  No labored breathing. Speech is clear and coherent with logical content.  Patient is alert and oriented at baseline.  Minimal swelling noted of anterior knee, inferior to patella. Normal ROM demonstrated.  Assessment and Plan: 1. Acute pain of right knee  (Primary) - naproxen  (NAPROSYN ) 500 MG tablet; Take 1 tablet (500 mg total) by mouth 2 (two) times daily with a meal.  Dispense: 20 tablet; Refill: 0  Supportive measures and OTC medications reviewed. Continue RICE. Add on Naprosyn . In person evaluation if not continuing to improve/resolve over weekend or any new/worsening symptoms despite conservative treatment.   Follow Up Instructions: I discussed the assessment and treatment plan with the patient. The patient was provided an opportunity to ask questions and all were answered. The patient agreed with the plan and demonstrated an understanding of the instructions.  A copy of instructions were sent to the patient via MyChart unless otherwise noted below.   The patient was advised to call back or seek an in-person evaluation if the symptoms worsen or if the condition fails to improve as anticipated.    Hyla Maillard, PA-C

## 2024-04-01 NOTE — Patient Instructions (Signed)
 Federico Hopkins, thank you for joining Hyla Maillard, PA-C for today's virtual visit.  While this provider is not your primary care provider (PCP), if your PCP is located in our provider database this encounter information will be shared with them immediately following your visit.   A [facility] MyChart account gives you access to today's visit and all your visits, tests, and labs performed at [facility] " click here if you don't have a [facility] MyChart account or go to [email or web address]  Consent: (Patient) Rameen HARIKA LAIDLAW provided verbal consent for this virtual visit at the beginning of the encounter.  Current Medications:  Current Outpatient Medications:    naproxen  (NAPROSYN ) 500 MG tablet, Take 1 tablet (500 mg total) by mouth 2 (two) times daily with a meal., Disp: 20 tablet, Rfl: 0   bisoprolol  (ZEBETA ) 5 MG tablet, Take 0.5 tablets (2.5 mg total) by mouth daily., Disp: 45 tablet, Rfl: 3   escitalopram  (LEXAPRO ) 10 MG tablet, Take 1 tablet (10 mg total) by mouth daily., Disp: 30 tablet, Rfl: 0   furosemide  (LASIX ) 20 MG tablet, Take 1 tablet (20 mg total) by mouth daily., Disp: 60 tablet, Rfl: 6   losartan  (COZAAR ) 25 MG tablet, Take 0.5 tablets (12.5 mg total) by mouth daily., Disp: 30 tablet, Rfl: 6   potassium chloride  SA (KLOR-CON  M) 20 MEQ tablet, Take 0.5 tablets (10 mEq total) by mouth daily., Disp: 15 tablet, Rfl: 0   Tiotropium Bromide Monohydrate  (SPIRIVA  RESPIMAT) 2.5 MCG/ACT AERS, Inhale 2 puffs into the lungs daily., Disp: 60 each, Rfl: 12   WIXELA INHUB 100-50 MCG/ACT AEPB, INHALE 1 DOSE BY MOUTH TWICE A DAY (Patient taking differently: Inhale 1 puff into the lungs 2 (two) times daily.), Disp: 180 each, Rfl: 1  Current Facility-Administered Medications:    albuterol  (PROVENTIL ) (2.5 MG/3ML) 0.083% nebulizer solution 2.5 mg, 2.5 mg, Nebulization, Once, Pender, Julie F, FNP   Medications ordered in this encounter:  Meds ordered this  encounter  Medications   naproxen  (NAPROSYN ) 500 MG tablet    Sig: Take 1 tablet (500 mg total) by mouth 2 (two) times daily with a meal.    Dispense:  20 tablet    Refill:  0    Supervising Provider:   Corine Dice [phone]     *If you need refills on other medications prior to your next appointment, please contact your pharmacy*  Follow-Up: Call back or seek an in-person evaluation if the symptoms worsen or if the condition fails to improve as anticipated.  [facility] [phone]  Other Instructions Continue rest, ice, elevation and compression. Ok to continue OTC tylenol . I am adding on Naprosyn  to take twice daily. Follow-up in person if symptoms are not resolving over the weekend or you note any new/worsening symptoms despite treatment.    If you have been instructed to have an in-person evaluation today at a local Urgent Care facility, please use the link below. It will take you to a list of all of our available [facility], including address, phone number and hours of operation. Please do not delay care.  [facility]  If you or a family member do not have a primary care provider, use the link below to schedule a visit and establish care. When you choose a [facility] primary care physician or advanced practice provider, you gain a long-term partner in health. Find a Primary Care Provider  Learn more about [facility] in-office  and virtual care options: [facility] - Get Care Now

## 2024-04-05 ENCOUNTER — Encounter: Payer: Self-pay | Admitting: Nurse Practitioner

## 2024-04-06 ENCOUNTER — Encounter: Payer: Self-pay | Admitting: Gastroenterology

## 2024-04-06 ENCOUNTER — Telehealth: Payer: Self-pay | Admitting: Nurse Practitioner

## 2024-04-06 NOTE — Telephone Encounter (Signed)
 Return to work form has been placed in [employer].  Pt send message in mychart about the return to work form. Pt also have dropped off the form today. Stated her anticipated return to work date is 04/19/24. She is needing to turn this in by 04/14/24. She is asking that you give her a call before sending it in [phone]  Pt last seen 03/19/24 and next sch'd appt 04/21/24

## 2024-04-07 NOTE — Telephone Encounter (Signed)
 Paperwork finished, pt notified

## 2024-04-09 ENCOUNTER — Telehealth: Payer: Self-pay | Admitting: Cardiology

## 2024-04-09 ENCOUNTER — Other Ambulatory Visit: Payer: Self-pay | Admitting: Nurse Practitioner

## 2024-04-09 ENCOUNTER — Other Ambulatory Visit: Payer: Self-pay

## 2024-04-09 NOTE — Telephone Encounter (Signed)
 Patient calling in about getting the covid shot, seeing if it would be okay for her to do. Please advise

## 2024-04-12 ENCOUNTER — Other Ambulatory Visit: Payer: Self-pay

## 2024-04-13 ENCOUNTER — Other Ambulatory Visit: Payer: Self-pay

## 2024-04-13 MED FILL — Potassium Chloride Microencapsulated Crys ER Tab 20 mEq: ORAL | 30 days supply | Qty: 15 | Fill #0 | Status: AC

## 2024-04-13 NOTE — Telephone Encounter (Signed)
 Requested medications are due for refill today.  yes  Requested medications are on the active medications list.  yes  Last refill. 03/17/2024 #15 0 rf  Future visit scheduled.   yes  Notes to clinic.  Please review for refill.    Requested Prescriptions  Pending Prescriptions Disp Refills   potassium chloride  SA (KLOR-CON  M) 20 MEQ tablet 15 tablet 0    Sig: Take 0.5 tablets (10 mEq total) by mouth daily.     Endocrinology:  Minerals - Potassium Supplementation Failed - 04/13/2024  8:06 AM      Failed - Cr in normal range and within 360 days    Creat  Date Value Ref Range Status  03/19/2024 1.15 (H) 0.50 - 0.99 mg/dL Final         Failed - Valid encounter within last 12 months    Recent Outpatient Visits           3 weeks ago Hospital discharge follow-up   [facility] Quinton Buckler, FNP   1 month ago    [facility] Donny Gall F, FNP   1 month ago Moderate persistent asthma with acute exacerbation   [facility] Quinton Buckler, FNP       Future Appointments             In 1 week Abram Hoguet Monalisa Angles, FNP [facility]   In 3 weeks Ronald Cockayne, NP [facility] - K in normal range and within 360 days    Potassium  Date Value Ref Range Status  03/19/2024 4.7 3.5 - 5.3 mmol/L Final

## 2024-04-14 MED ORDER — POTASSIUM CHLORIDE ER 10 MEQ PO TBCR: 10.0000 meq | EXTENDED_RELEASE_TABLET | Freq: Every day | ORAL | 3 refills | Status: DC

## 2024-04-15 ENCOUNTER — Other Ambulatory Visit: Payer: Self-pay | Admitting: Nurse Practitioner

## 2024-04-15 DIAGNOSIS — F419 Anxiety disorder, unspecified: Secondary | ICD-10-CM

## 2024-04-16 NOTE — Telephone Encounter (Signed)
 Requested medications are due for refill today.  yes  Requested medications are on the active medications list.  yes  Last refill. 03/19/2024 #30 0 rf  Future visit scheduled.   yes  Notes to clinic.  New pt. New medication to this pt. Current supply will run out before next appt. Please advise.     Requested Prescriptions  Pending Prescriptions Disp Refills   escitalopram  (LEXAPRO ) 10 MG tablet [Pharmacy Med Name: ESCITALOPRAM  10 MG TABLET] 30 tablet 0    Sig: TAKE 1 TABLET BY MOUTH EVERY DAY     Psychiatry:  Antidepressants - SSRI Failed - 04/16/2024  1:49 PM      Failed - Valid encounter within last 6 months    Recent Outpatient Visits           4 weeks ago Hospital discharge follow-up   [facility] Quinton Buckler, FNP   1 month ago    [facility] Donny Gall F, FNP   1 month ago Moderate persistent asthma with acute exacerbation   [facility] Quinton Buckler, FNP       Future Appointments             In 5 days Abram Hoguet, Monalisa Angles, FNP [facility], [facility]   In 2 weeks Ronald Cockayne, NP [facility] - Completed PHQ-2 or PHQ-9 in the last 360 days

## 2024-04-21 ENCOUNTER — Encounter: Payer: Self-pay | Admitting: Nurse Practitioner

## 2024-04-21 ENCOUNTER — Ambulatory Visit (INDEPENDENT_AMBULATORY_CARE_PROVIDER_SITE_OTHER): Payer: Self-pay | Admitting: Nurse Practitioner

## 2024-04-21 VITALS — BP 124/80 | HR 103 | Wt 229.8 lb

## 2024-04-21 DIAGNOSIS — G4733 Obstructive sleep apnea (adult) (pediatric): Secondary | ICD-10-CM | POA: Diagnosis not present

## 2024-04-21 DIAGNOSIS — E66813 Obesity, class 3: Secondary | ICD-10-CM

## 2024-04-21 DIAGNOSIS — Z1231 Encounter for screening mammogram for malignant neoplasm of breast: Secondary | ICD-10-CM

## 2024-04-21 DIAGNOSIS — J42 Unspecified chronic bronchitis: Secondary | ICD-10-CM | POA: Diagnosis not present

## 2024-04-21 DIAGNOSIS — F419 Anxiety disorder, unspecified: Secondary | ICD-10-CM

## 2024-04-21 DIAGNOSIS — M25561 Pain in right knee: Secondary | ICD-10-CM

## 2024-04-21 DIAGNOSIS — I5030 Unspecified diastolic (congestive) heart failure: Secondary | ICD-10-CM

## 2024-04-21 DIAGNOSIS — F330 Major depressive disorder, recurrent, mild: Secondary | ICD-10-CM

## 2024-04-21 DIAGNOSIS — J9859 Other diseases of mediastinum, not elsewhere classified: Secondary | ICD-10-CM

## 2024-04-21 DIAGNOSIS — J4540 Moderate persistent asthma, uncomplicated: Secondary | ICD-10-CM | POA: Diagnosis not present

## 2024-04-21 DIAGNOSIS — Z6841 Body Mass Index (BMI) 40.0 and over, adult: Secondary | ICD-10-CM

## 2024-04-21 MED ORDER — ESCITALOPRAM OXALATE 10 MG PO TABS: 10.0000 mg | ORAL_TABLET | Freq: Every day | ORAL | 0 refills | Status: DC

## 2024-04-21 MED ORDER — ALBUTEROL SULFATE 1.25 MG/3ML IN NEBU: 1.0000 | INHALATION_SOLUTION | Freq: Four times a day (QID) | RESPIRATORY_TRACT | 12 refills | Status: AC | PRN

## 2024-04-21 MED ORDER — NAPROXEN 500 MG PO TABS: 500.0000 mg | ORAL_TABLET | Freq: Two times a day (BID) | ORAL | 0 refills | Status: DC

## 2024-04-21 NOTE — Progress Notes (Signed)
 BP 124/80   Pulse (!) 103   Wt 229 lb 12.8 oz (104.2 kg)   LMP 04/10/2024 (Exact Date)   BMI 39.45 kg/m    Subjective:    Patient ID: Virginia Camacho, female    DOB: 03/18/1981, 43 y.o.   MRN: [id number]  HPI: Virginia Camacho is a 43 y.o. female  Chief Complaint  Patient presents with   Medical Management of Chronic Issues    Discussed the use of AI scribe software for clinical note transcription with the patient, who gave verbal consent to proceed.  History of Present Illness Virginia Camacho Stable Gonzaga is a 43 year old female with a history of heart failure, COPD, asthma, sleep apnea, anxiety, obesity, and depression who presents for medication management and follow-up on a mediastinal mass.  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 She has an upcoming appointment with GI on June 01, 2024, for further evaluation.  She describes a recent knee issue where her knee 'popped out' and 'popped back' while laying in bed, leading to stiffness and difficulty walking. She has been taking naproxen  for inflammation, which has helped, but she still experiences stiffness. This occurred about three weeks ago, and she has been elevating her knee and recently returned to work.  She mentions experiencing dry skin on her breasts, stomach, and thighs, which started about a week and a half ago. She uses cocoa butter but describes the skin as feeling different, not flaky. She has not been taking Lexapro  for over a week, as she felt better.  She discusses her breathing issues, noting that physical exertion feels like being on a treadmill. She uses an oximeter to monitor her oxygen levels, which have been around 91. She has been gradually increasing her physical activity, including using a bike and walking inclines. She reports feeling better overall but is cautious about  overexertion.  She inquires about her COVID-19 vaccination status, having received the initial vaccine and a booster in 2021, and recently another dose. She also asks about the RSV and pneumonia vaccines.  She is concerned about her vitamin D  intake, currently taking 5000 IU three to four times a week. She also mentions needing a refill for her nebulizer medication, as she has the device but no medication. She uses the nebulizer as needed for shortness of breath.         04/21/2024    9:26 AM 03/11/2024    2:51 PM 10/22/2023    3:11 PM  Depression screen PHQ 2/9  Decreased Interest 0 0 0  Down, Depressed, Hopeless 0 0   PHQ - 2 Score 0 0 0  Altered sleeping 0 1   Tired, decreased energy 0 1   Change in appetite 0 1   Feeling bad or failure about yourself  0 0   Trouble concentrating 0 0   Moving slowly or fidgety/restless 0 0   Suicidal thoughts 0 0   PHQ-9 Score 0 3   Difficult doing work/chores Not difficult at all Not difficult at all        04/21/2024    9:26 AM 03/11/2024    2:52 PM 06/14/2022    1:23 PM 12/07/2021    8:37 AM  GAD 7 : Generalized Anxiety Score  Nervous, Anxious, on Edge 1 1 1 1   Control/stop worrying 0 1 1 2   Worry too much -  different things 0 1 1 2   Trouble relaxing 0 1 0 1  Restless 0 0 0 1  Easily annoyed or irritable 0 0 0 1  Afraid - awful might happen 0 1 0 1  Total GAD 7 Score 1 5 3 9   Anxiety Difficulty Not difficult at all Not difficult at all Somewhat difficult      Relevant past medical, surgical, family and social history reviewed and updated as indicated. Interim medical history since our last visit reviewed. Allergies and medications reviewed and updated.  Review of Systems  Constitutional: Negative for fever or weight change.  Respiratory: Negative for cough and shortness of breath.   Cardiovascular: Negative for chest pain or palpitations.  Gastrointestinal: Negative for abdominal pain, no bowel changes.  Musculoskeletal: Negative  for gait problem or joint swelling.  Skin: Negative for rash.  Neurological: Negative for dizziness or headache.  No other specific complaints in a complete review of systems (except as listed in HPI above).      Objective:      BP 124/80   Pulse (!) 103   Wt 229 lb 12.8 oz (104.2 kg)   LMP 04/10/2024 (Exact Date)   BMI 39.45 kg/m    Wt Readings from Last 3 Encounters:  04/21/24 229 lb 12.8 oz (104.2 kg)  03/24/24 229 lb 3.2 oz (104 kg)  03/19/24 236 lb 11.2 oz (107.4 kg)    Physical Exam Vitals reviewed.  Constitutional:      Appearance: Normal appearance.  HENT:     Head: Normocephalic.  Cardiovascular:     Rate and Rhythm: Normal rate and regular rhythm.  Pulmonary:     Effort: Pulmonary effort is normal.     Breath sounds: Normal breath sounds.  Musculoskeletal:        General: Normal range of motion.  Skin:    General: Skin is warm and dry.  Neurological:     General: No focal deficit present.     Mental Status: She is alert and oriented to person, place, and time. Mental status is at baseline.  Psychiatric:        Mood and Affect: Mood normal.        Behavior: Behavior normal.        Thought Content: Thought content normal.        Judgment: Judgment normal.       Results for orders placed or performed in visit on 03/19/24  CBC with Differential/Platelet   Collection Time: 03/19/24  8:48 AM  Result Value Ref Range   WBC 6.6 3.8 - 10.8 Thousand/uL   RBC 6.90 (H) 3.80 - 5.10 Million/uL   Hemoglobin 16.6 (H) 11.7 - 15.5 g/dL   HCT 16.1 (H) 09.6 - 04.5 %   MCV 78.1 (L) 80.0 - 100.0 fL   MCH 24.1 (L) 27.0 - 33.0 pg   MCHC 30.8 (L) 32.0 - 36.0 g/dL   RDW 40.9 (H) 81.1 - 91.4 %   Platelets 324 140 - 400 Thousand/uL   MPV 10.9 7.5 - 12.5 fL   Neutro Abs 4,099 1,500 - 7,800 cells/uL   Absolute Lymphocytes 2,026 850 - 3,900 cells/uL   Absolute Monocytes 363 200 - 950 cells/uL   Eosinophils Absolute 92 15 - 500 cells/uL   Basophils Absolute 20 0 - 200  cells/uL   Neutrophils Relative % 62.1 %   Total Lymphocyte 30.7 %   Monocytes Relative 5.5 %   Eosinophils Relative 1.4 %   Basophils Relative 0.3 %  Comprehensive metabolic panel with GFR   Collection Time: 03/19/24  8:48 AM  Result Value Ref Range   Glucose, Bld 85 65 - 99 mg/dL   BUN 28 (H) 7 - 25 mg/dL   Creat 6.04 (H) 5.40 - 0.99 mg/dL   eGFR 61 > OR = 60 JW/JXB/1.47W2   BUN/Creatinine Ratio 24 (H) 6 - 22 (calc)   Sodium 138 135 - 146 mmol/L   Potassium 4.7 3.5 - 5.3 mmol/L   Chloride 99 98 - 110 mmol/L   CO2 30 20 - 32 mmol/L   Calcium 9.8 8.6 - 10.2 mg/dL   Total Protein 6.4 6.1 - 8.1 g/dL   Albumin 3.7 3.6 - 5.1 g/dL   Globulin 2.7 1.9 - 3.7 g/dL (calc)   AG Ratio 1.4 1.0 - 2.5 (calc)   Total Bilirubin 0.8 0.2 - 1.2 mg/dL   Alkaline phosphatase (APISO) 30 (L) 31 - 125 U/L   AST 14 10 - 30 U/L   ALT 22 6 - 29 U/L  Brain natriuretic peptide   Collection Time: 03/19/24  8:48 AM  Result Value Ref Range   Brain Natriuretic Peptide 53 <100 pg/mL          Assessment & Plan:   Problem List Items Addressed This Visit       Cardiovascular and Mediastinum   (HFpEF) heart failure with preserved ejection fraction (HCC) - Primary     Respiratory   Asthma   Relevant Medications   albuterol  (ACCUNEB ) 1.25 MG/3ML nebulizer solution   OSA (obstructive sleep apnea)   COPD (chronic obstructive pulmonary disease) (HCC)   Relevant Medications   albuterol  (ACCUNEB ) 1.25 MG/3ML nebulizer solution     Other   Class 3 severe obesity due to excess calories with serious comorbidity and body mass index (BMI) of 40.0 to 44.9 in adult   Anxiety   Relevant Medications   escitalopram  (LEXAPRO ) 10 MG tablet   Mild episode of recurrent major depressive disorder (HCC)   Relevant Medications   escitalopram  (LEXAPRO ) 10 MG tablet   Other Visit Diagnoses       Mediastinal mass         Encounter for screening mammogram for malignant neoplasm of breast       Relevant Orders   MM  3D SCREENING MAMMOGRAM BILATERAL BREAST     Acute pain of right knee       Relevant Medications   naproxen  (NAPROSYN ) 500 MG tablet        Assessment and Plan Assessment & Plan Chronic Obstructive Pulmonary Disease (COPD) COPD with emphysema. Reports improved breathing with current regimen but experiences exertional dyspnea. Oxygen saturation decreases with exertion but recovers. Discussed appropriate inhaler use and advised limiting nebulizer use to significant dyspnea due to potential for tachycardia and anxiety. - Resend nebulizer solution prescription. - Instruct to use nebulizer only for significant dyspnea. - Advise albuterol  inhaler 30 minutes before physical activity. - Continue Wixela twice daily.  Asthma Asthma managed with current inhaler regimen. Experiences exertional dyspnea but improving with exercise and activity modifications. Encouraged gradual increase in physical activity to improve conditioning. - Continue Wixela twice daily. - Use albuterol  inhaler 30 minutes before physical activity.  Heart Failure Heart failure managed with bisoprolol , losartan , and Lasix . - Continue current cardiac medication regimen.  Knee Sprain Right knee sprain with ACL reconstruction history. Reports stiffness and previous inability to bear weight. Naproxen  has been helpful. Symptoms persist for three weeks. Advised to monitor symptoms and consider orthopedic referral  if no improvement. - Refill naproxen  for 10 days, instruct to take twice daily. - Advise to monitor symptoms and consider orthopedic referral if no improvement.  Anxiety Anxiety previously managed with Lexapro . Reports improvement and has not taken Lexapro  for over a week. Discussed importance of continuing medication to prevent recurrence. Advised to continue Lexapro  daily for at least three months to maintain symptom control. - Refill Lexapro  prescription. - Instruct to take Lexapro  daily for at least three  months.  General Health Maintenance Discussed COVID-19 vaccination status and recommendations. Has received three doses. Discussed RSV and pneumonia vaccinations. Advised to wait for new COVID-19 vaccine before receiving another dose. RSV vaccine not indicated due to age. Recommended pneumonia vaccine and checked availability at pharmacy. Advised vitamin D  5000 IU three to four times a week. - Advise to wait for new COVID-19 vaccine before receiving another dose. - Inform that RSV vaccine is not currently indicated due to age. - Recommend pneumonia vaccine, check availability at pharmacy. - Advise to take vitamin D  5000 IU three to four times a week.  Obesity -continue working on increasing physical activity as tolerated and eating a well balanced diet  Follow-up Follow-up planned to assess progress and results from GI consultation. Will review new findings and adjust management as needed. - Schedule follow-up appointment in four months.        Follow up plan: Return in about 4 months (around 08/22/2024) for follow up.

## 2024-04-26 ENCOUNTER — Ambulatory Visit (INDEPENDENT_AMBULATORY_CARE_PROVIDER_SITE_OTHER): Admitting: Student in an Organized Health Care Education/Training Program

## 2024-04-26 ENCOUNTER — Other Ambulatory Visit
Admission: RE | Admit: 2024-04-26 | Discharge: 2024-04-26 | Disposition: A | Discharge status: Home / Self Care | Source: Ambulatory Visit | Attending: Student in an Organized Health Care Education/Training Program | Admitting: Student in an Organized Health Care Education/Training Program

## 2024-04-26 ENCOUNTER — Encounter: Payer: Self-pay | Admitting: Student in an Organized Health Care Education/Training Program

## 2024-04-26 VITALS — BP 124/88 | HR 99 | Temp 97.6°F | Ht 64.0 in | Wt 225.0 lb

## 2024-04-26 DIAGNOSIS — J4520 Mild intermittent asthma, uncomplicated: Secondary | ICD-10-CM | POA: Diagnosis not present

## 2024-04-26 DIAGNOSIS — J432 Centrilobular emphysema: Secondary | ICD-10-CM

## 2024-04-26 DIAGNOSIS — I5030 Unspecified diastolic (congestive) heart failure: Secondary | ICD-10-CM

## 2024-04-26 DIAGNOSIS — G4733 Obstructive sleep apnea (adult) (pediatric): Secondary | ICD-10-CM

## 2024-04-26 DIAGNOSIS — Z87891 Personal history of nicotine dependence: Secondary | ICD-10-CM

## 2024-04-26 DIAGNOSIS — Z9981 Dependence on supplemental oxygen: Secondary | ICD-10-CM

## 2024-04-26 LAB — C-REACTIVE PROTEIN: CRP: 0.6 mg/dL (ref <1.0)

## 2024-04-26 LAB — SEDIMENTATION RATE: Sed Rate: 3 mm/h (ref 0–20)

## 2024-04-26 NOTE — Progress Notes (Signed)
 Assessment & Plan:   #Centrilobular emphysema #Acute Hypoxic Respiratory Failure #Acute Decompensated HFpEF #RV Dysfunction  #Group II Pulmonary Hypertension #OSA on CPAP  Patient presenting for follow-up after recent hospitalization with decompensated heart failure requiring IV diuresis.  She was last seen in clinic in March where PFTs had shown an obstructive defect with a significant bronchodilator response.  There was a concomitant drop in DLCO and imaging that had shown severe centrilobular emphysema with the report not suggesting cystic lung disease.  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  She has had an alpha-1 antitrypsin level which was normal and did not show any signs of deficiency.  While the CT scan report suggests severe emphysema, I will continue with workup for cystic lung disease.  Today, I will reorder her TSC1/TSC2 and FLCN gene analysis and assess for VEGF-D deficiency.  I have also reviewed her mildly positive ANA with negative specific antibody testing.  It is possible that this is a false positive and I will further evaluate for acute inflammation with ESR/CRP.  Should these be normal/negative, will support the ANA results being false positive.  Given admission for decompensated heart failure with RV dysfunction, I think she might benefit from a right heart cath to assess her filling pressures and evaluate for pre vs post capillary pulmonary hypertension.  Finally, I did broach the topic of transplant with Virginia Camacho today.  Will continue with our workup and work on optimizing her cardiopulmonary status.  We might have to visit this topic in the future.  - Other/Misc lab test; TSC1/TSC2  - Other/Misc lab test; FLCN gene  - Other/Misc lab test; VEGF-D  - Sedimentation rate; Future - C-reactive protein; Future - CPAP compliance report  reviewed > in compliance with no residual AHI - Continue Wixela 1 puff twice daily - Continue Spiriva  Respimat once daily   Return in about 4 weeks (around 05/24/2024).  I spent 30 minutes caring for this patient today, including preparing to see the patient, obtaining a medical history , reviewing a separately obtained history, performing a medically appropriate examination and/or evaluation, counseling and educating the patient/family/caregiver, ordering medications, tests, or procedures, documenting clinical information in the electronic health record, and independently interpreting results (not separately reported/billed) and communicating results to the patient/family/caregiver  Virginia Glasgow, MD [facility]  End of visit medications:  No orders of the defined types were placed in this encounter.    Current Outpatient Medications:    albuterol  (ACCUNEB ) 1.25 MG/3ML nebulizer solution, Take 3 mLs (1.25 mg total) by nebulization every 6 (six) hours as needed for wheezing., Disp: 75 mL, Rfl: 12   bisoprolol  (ZEBETA ) 5 MG tablet, Take 0.5 tablets (2.5 mg total) by mouth daily., Disp: 45 tablet, Rfl: 3   cholecalciferol (VITAMIN D3) 25 MCG (1000 UNIT) tablet, Take 1,000 Units by mouth daily., Disp: , Rfl:    escitalopram  (LEXAPRO ) 10 MG tablet, Take 1 tablet (10 mg total) by mouth daily., Disp: 90 tablet, Rfl: 0   ferrous sulfate  324 MG TBEC, Take 324 mg by mouth daily., Disp: , Rfl:    furosemide  (LASIX ) 20 MG tablet, Take 1 tablet (20 mg total) by mouth daily., Disp: 60 tablet, Rfl: 6   losartan  (COZAAR ) 25 MG tablet, Take 0.5 tablets (12.5 mg total) by mouth daily., Disp: 30 tablet, Rfl: 6   naproxen  (NAPROSYN ) 500 MG tablet, Take 1  tablet (500 mg total) by mouth 2 (two) times daily with a meal., Disp: 20 tablet, Rfl: 0   potassium chloride  (KLOR-CON ) 10 MEQ tablet, Take 1 tablet (10 mEq total) by mouth daily., Disp: 90 tablet, Rfl: 3   Tiotropium Bromide Monohydrate   (SPIRIVA  RESPIMAT) 2.5 MCG/ACT AERS, Inhale 2 puffs into the lungs daily., Disp: 60 each, Rfl: 12   WIXELA INHUB 100-50 MCG/ACT AEPB, INHALE 1 DOSE BY MOUTH TWICE A DAY (Patient taking differently: Inhale 1 puff into the lungs 2 (two) times daily.), Disp: 180 each, Rfl: 1   Subjective:   PATIENT ID: Virginia Camacho GENDER: female DOB: [birth year], MRN: [id number]  Chief Complaint  Patient presents with   Follow-up    Occasional cough.     HPI  Virginia Camacho is a pleasant 43 year old female presenting to clinic for follow up.  I last met with Virginia Camacho in March with a video visit to discuss the findings from her CT scan that showed significant centrilobular emphysema.  Since then, she has had a hospitalization with increased shortness of breath secondary to decompensated heart failure.  She was admitted for 7 days at [facility] with respiratory failure secondary to pulmonary edema.  Echocardiogram showed RV volume and pressure overload.  She was aggressively diuresed with IV furosemide  with significant improvement in her symptoms and oxygenation.  She was seen by cardiology in clinic with her overall heart failure regimen optimized and with improvement in her symptoms.  She is also referred to cardiology for the evaluation of the paraesophageal mass noted incidentally on her chest CT.  She has been compliant with her diuretic regimen as well as with her inhalers (Wixela and Spiriva  Respimat).  She continues to be short of breath with significant exertion.  She reports going back to regular using her CPAP device and compliance report reviewed after today's visit shows excellent compliance (more than 4 hours 97% of the time).  Her residual AHI is 0.1.  Patient has had some of the blood work that I had ordered during her prior visit which included an ANA that returned positive but with negative specific antibody testing.  Her alpha-1 antitrypsin was normal.  She was unable to get the  TSC1/TSC2, FLC and, and FDG F-D gene analysis.    She reports no personal history of lung disease. She grew up in [place] and was exposed to extreme cold as a young adult which did not cause her to be short of breath.  She was never treated for any bronchitis or similar presentations.  She was very active in her youth and played a lot of basketball without limitation.  She is actively working on weight loss and has been successful in losing weight.   She has a history of smoking, quit 2 years ago (around 10 pack years of smoking history). She also reports previous heavy marijuana use (around 4-5 rolled cigarettes a day) for a few years, but hasn't done so in a long time. There are no current inhalational exposures reported.    She does not have any pets but used to have a dog. She does notice some environmental allergies but has not had to be treated for them.  She lives in an apartment but does not have any mold on her house is mostly hardwood. 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 Family history is notable for severe asthma in her  mother and sister.  Ancillary information including prior medications, full medical/surgical/family/social histories, and PFTs (when available) are listed below and have been reviewed.   Review of Systems  Constitutional:  Negative for chills, diaphoresis, fever, malaise/fatigue and weight loss.  Respiratory:  Positive for shortness of breath. Negative for cough, hemoptysis, sputum production and wheezing.   Cardiovascular:  Negative for chest pain.  Skin:  Negative for rash.     Objective:   Vitals:   04/26/24 1536  BP: 124/88  Pulse: 99  Temp: 97.6 F (36.4 C)  TempSrc: Temporal  SpO2: 90%  Weight: 225 lb (102.1 kg)  Height: 5\' 4"  (1.626 m)   90% on RA  BMI Readings from Last 3 Encounters:  04/26/24 38.62 kg/m  04/21/24 39.45 kg/m  03/24/24 39.34 kg/m   Wt Readings from  Last 3 Encounters:  04/26/24 225 lb (102.1 kg)  04/21/24 229 lb 12.8 oz (104.2 kg)  03/24/24 229 lb 3.2 oz (104 kg)    Physical Exam Constitutional:      General: She is not in acute distress.    Appearance: Normal appearance. She is obese. She is not ill-appearing.  HENT:     Head: Normocephalic.     Mouth/Throat:     Mouth: Mucous membranes are moist.  Eyes:     Extraocular Movements: Extraocular movements intact.     Pupils: Pupils are equal, round, and reactive to light.  Cardiovascular:     Rate and Rhythm: Normal rate.     Pulses: Normal pulses.     Heart sounds: Normal heart sounds.  Pulmonary:     Effort: Pulmonary effort is normal. No respiratory distress.     Breath sounds: Rales present. No wheezing or rhonchi.  Abdominal:     Palpations: Abdomen is soft.  Musculoskeletal:     Cervical back: Normal range of motion.  Skin:    General: Skin is warm.  Neurological:     General: No focal deficit present.     Mental Status: She is alert and oriented to person, place, and time. Mental status is at baseline.       Ancillary Information    Past Medical History:  Diagnosis Date   Allergy    Asthma    Back pain    Emphysema lung (HCC)    Morbid obesity with BMI of 40.0-44.9, adult (HCC) 03/20/2016     Family History  Problem Relation Age of Onset   Heart disease Mother    Diabetes Mother    Breast cancer Maternal Grandmother    Heart disease Maternal Grandfather      Past Surgical History:  Procedure Laterality Date   KNEE SURGERY Right     Social History   Socioeconomic History   Marital status: Single    Spouse name: Not on file   Number of children: Not on file   Years of education: Not on file   Highest education level: Not on file  Occupational History   Not on file  Tobacco Use   Smoking status: Former    Current packs/day: 0.00    Average packs/day: 0.3 packs/day for 20.0 years (5.0 ttl pk-yrs)    Types: Cigarettes    Start date:  2002    Quit date: 2022    Years since quitting: 3.3   Smokeless tobacco: Never  Vaping Use   Vaping status: Never Used  Substance and Sexual Activity   Alcohol use: Yes    Alcohol/week: 0.0 standard drinks of alcohol  Comment: rarely   Drug use: Yes    Frequency: 1.0 times per week    Types: Marijuana    Comment: occasional    Sexual activity: Yes    Partners: Female, Female    Birth control/protection: Condom  Other Topics Concern   Not on file  Social History Narrative   Not on file   Social Drivers of Health   Financial Resource Strain: Low Risk  (05/31/2021)   Overall Financial Resource Strain (CARDIA)    Difficulty of Paying Living Expenses: Not hard at all  Food Insecurity: No Food Insecurity (03/11/2024)   Hunger Vital Sign    Worried About Running Out of Food in the Last Year: Never true    Ran Out of Food in the Last Year: Never true  Transportation Needs: No Transportation Needs (03/11/2024)   PRAPARE - Administrator, Civil Service (Medical): No    Lack of Transportation (Non-Medical): No  Physical Activity: Inactive (05/31/2021)   Exercise Vital Sign    Days of Exercise per Week: 0 days    Minutes of Exercise per Session: 0 min  Stress: No Stress Concern Present (05/31/2021)   [employer] of Occupational Health - Occupational Stress Questionnaire    Feeling of Stress : Only a little  Social Connections: Socially Isolated (05/31/2021)   Social Connection and Isolation Panel [NHANES]    Frequency of Communication with Friends and Family: More than three times a week    Frequency of Social Gatherings with Friends and Family: Once a week    Attends Religious Services: Never    Database administrator or Organizations: No    Attends Banker Meetings: Never    Marital Status: Never married  Intimate Partner Violence: Not At Risk (03/11/2024)   Humiliation, Afraid, Rape, and Kick questionnaire    Fear of Current or Ex-Partner: No     Emotionally Abused: No    Physically Abused: No    Sexually Abused: No     Allergies  Allergen Reactions   Oxycodone Hives     CBC    Component Value Date/Time   WBC 6.6 03/19/2024 0848   RBC 6.90 (H) 03/19/2024 0848   HGB 16.6 (H) 03/19/2024 0848   HGB 14.0 12/24/2022 1130   HCT 53.9 (H) 03/19/2024 0848   HCT 45.4 12/24/2022 1130   PLT 324 03/19/2024 0848   PLT 356 12/24/2022 1130   MCV 78.1 (L) 03/19/2024 0848   MCV 72 (L) 12/24/2022 1130   MCH 24.1 (L) 03/19/2024 0848   MCHC 30.8 (L) 03/19/2024 0848   RDW 15.5 (H) 03/19/2024 0848   RDW 21.4 (H) 12/24/2022 1130   LYMPHSABS 0.7 03/12/2024 0405   LYMPHSABS 2.2 12/24/2022 1130   MONOABS 0.1 03/12/2024 0405   EOSABS 92 03/19/2024 0848   EOSABS 0.1 12/24/2022 1130   BASOSABS 20 03/19/2024 0848   BASOSABS 0.1 12/24/2022 1130    Pulmonary Functions Testing Results:    Latest Ref Rng & Units 01/14/2023    4:34 PM  PFT Results  FVC-Pre L 2.83   FVC-Predicted Pre % 77   FVC-Post L 3.12   FVC-Predicted Post % 85   Pre FEV1/FVC % % 63   Post FEV1/FCV % % 68   FEV1-Pre L 1.78   FEV1-Predicted Pre % 59   FEV1-Post L 2.12   DLCO uncorrected ml/min/mmHg 10.07   DLCO UNC% % 46   DLVA Predicted % 51   TLC L 5.53  TLC % Predicted % 109   RV % Predicted % 149     Outpatient Medications Prior to Visit  Medication Sig Dispense Refill   albuterol  (ACCUNEB ) 1.25 MG/3ML nebulizer solution Take 3 mLs (1.25 mg total) by nebulization every 6 (six) hours as needed for wheezing. 75 mL 12   bisoprolol  (ZEBETA ) 5 MG tablet Take 0.5 tablets (2.5 mg total) by mouth daily. 45 tablet 3   cholecalciferol (VITAMIN D3) 25 MCG (1000 UNIT) tablet Take 1,000 Units by mouth daily.     escitalopram  (LEXAPRO ) 10 MG tablet Take 1 tablet (10 mg total) by mouth daily. 90 tablet 0   ferrous sulfate  324 MG TBEC Take 324 mg by mouth daily.     furosemide  (LASIX ) 20 MG tablet Take 1 tablet (20 mg total) by mouth daily. 60 tablet 6   losartan  (COZAAR )  25 MG tablet Take 0.5 tablets (12.5 mg total) by mouth daily. 30 tablet 6   naproxen  (NAPROSYN ) 500 MG tablet Take 1 tablet (500 mg total) by mouth 2 (two) times daily with a meal. 20 tablet 0   potassium chloride  (KLOR-CON ) 10 MEQ tablet Take 1 tablet (10 mEq total) by mouth daily. 90 tablet 3   Tiotropium Bromide Monohydrate  (SPIRIVA  RESPIMAT) 2.5 MCG/ACT AERS Inhale 2 puffs into the lungs daily. 60 each 12   WIXELA INHUB 100-50 MCG/ACT AEPB INHALE 1 DOSE BY MOUTH TWICE A DAY (Patient taking differently: Inhale 1 puff into the lungs 2 (two) times daily.) 180 each 1   No facility-administered medications prior to visit.

## 2024-04-27 LAB — MISC LABCORP TEST (SEND OUT)

## 2024-05-04 ENCOUNTER — Ambulatory Visit
Admission: RE | Admit: 2024-05-04 | Discharge: 2024-05-04 | Disposition: A | Discharge status: Home / Self Care | Source: Ambulatory Visit | Attending: Nurse Practitioner | Admitting: Nurse Practitioner

## 2024-05-04 ENCOUNTER — Encounter: Payer: Self-pay | Admitting: Cardiology

## 2024-05-04 ENCOUNTER — Ambulatory Visit: Attending: Cardiology | Admitting: Cardiology

## 2024-05-04 VITALS — BP 130/78 | HR 90 | Ht 64.0 in | Wt 225.0 lb

## 2024-05-04 DIAGNOSIS — G4733 Obstructive sleep apnea (adult) (pediatric): Secondary | ICD-10-CM | POA: Diagnosis not present

## 2024-05-04 DIAGNOSIS — E7800 Pure hypercholesterolemia, unspecified: Secondary | ICD-10-CM | POA: Diagnosis not present

## 2024-05-04 DIAGNOSIS — Z1231 Encounter for screening mammogram for malignant neoplasm of breast: Secondary | ICD-10-CM | POA: Diagnosis present

## 2024-05-04 DIAGNOSIS — I272 Pulmonary hypertension, unspecified: Secondary | ICD-10-CM

## 2024-05-04 DIAGNOSIS — I10 Essential (primary) hypertension: Secondary | ICD-10-CM

## 2024-05-04 DIAGNOSIS — I5030 Unspecified diastolic (congestive) heart failure: Secondary | ICD-10-CM | POA: Diagnosis not present

## 2024-05-04 NOTE — Progress Notes (Signed)
 Cardiology Office Note:  .   Date:  05/04/2024  ID:  Virginia Camacho, DOB [birth year], MRN [id number] PCP: Quinton Buckler, FNP  [facility] Providers Cardiologist:  Constancia Delton, MD    History of Present Illness: .   Virginia Camacho is a 43 y.o. female with past medical history of hyperlipidemia, morbid obesity, former smoker x 15 years, atypical chest pain, asthma, emphysema, struct of sleep apnea, chronic migraines, chronic back pain, HFpEF, pulmonary hypertension moderately increased on recent echocardiogram, who is here today for follow-up.   She had previously been referred to cardiology for evaluation of chest pain with no prior cardiac history of recurrent shortness of breath that have been happening for the last year when she was evaluated treated by pulmonary.  She was seen in clinic 06/18/2023.  He was scheduled for an echocardiogram and a coronary CTA.  Unfortunately neither of those tests were completed at this time.  She presented to [facility] with complaints of shortness of breath on 03/11/2024.  She has history of asthma and recent diagnosis of emphysema.  She was noted to be at her PCPs office with oxygen saturations in the 70s and she was given albuterol  and steroids and admitted for further evaluation.  She was treated for acute respiratory failure with hypoxia and HFpEF exacerbation.  2D echo revealed indeterminate diastolic filling, LVEF 55%, no RWMA,  RA volume overload mildly reduced systolic function with severe RV enlargement and moderately elevated pulmonary artery systolic pressure.  She was treated with IV Lasix  and was diuresed about 10 pounds.  Losartan  was added to her medication regimen along with oral furosemide  twice daily.  She was educated and encouraged to continue with CPAP regularly to prevent further right heart failure.  She also had mediastinal masslike opacity noted on resolution CT recently ordered by pulmonary with  recommendation to continue with outpatient follow-up.  She was considered stable and discharged 03/17/24.    She was last seen in clinic/16/25 of.  She was extremely concerned over her heart failure diagnoses in her hospital.  She was diuresed and approximately lost 10 pounds.  She was started on GDMT and they had several questions related to her current medication regimen.  Furosemide  was decreased to 20 mg daily versus twice daily.  Losartan  was decreased to 12.5 mg daily and she was encouraged to continue to monitor pressure 1 to 2 hours postmedication administration.  She also had an abnormal chest CT with mediastinal masslike opacity noted on high-resolution CT scan and was encouraged to continue to keep her follow-up with pulmonary.  She returns to clinic today accompanied by her friend.  She states overall she has been doing well.  Weight has remained stable, shortness of breath has slightly improved, no further instances of peripheral edema, no chest pain, no palpitations.  She is tolerating medication changes previously made with the only adverse side effect noted is dry skin that has slightly improved with decreasing furosemide  at last visit.  She has followed up with pulmonary.  She has noted occasional cough with sputum that is clear.  Possible postnasal drip.  She also states that she has been compliant with her CPAP.  She denies any hospitalizations or visits to the emergency department.  ROS: 10 point review of systems has been reviewed and considered negative except ones been listed in HPI  Studies Reviewed: .        2D echo 03/12/2024 1. Left ventricular ejection fraction, by estimation, is  55%. The left  ventricle has normal function. The left ventricle has no regional wall  motion abnormalities. Indeterminate diastolic filling due to E-A fusion.   2. D-shaped septum suggestive of RV pressure/volume overload. Right  ventricular systolic function is moderately reduced. The right  ventricular  size is severely enlarged. There is moderately elevated pulmonary artery  systolic pressure. The estimated  right ventricular systolic pressure is 52.1 mmHg.   3. Right atrial size was mildly dilated.   4. The mitral valve is normal in structure. No evidence of mitral valve  regurgitation. No evidence of mitral stenosis.   5. The aortic valve is tricuspid. Aortic valve regurgitation is not  visualized. No aortic stenosis is present.   6. The inferior vena cava is dilated in size with >50% respiratory  variability, suggesting right atrial pressure of 8 mmHg.    Risk Assessment/Calculations:             Physical Exam:   VS:  BP 130/78 (BP Location: Left Arm, Patient Position: Sitting, Cuff Size: Large)   Pulse 90   Ht 5\' 4"  (1.626 m)   Wt 225 lb (102.1 kg)   LMP 04/10/2024 (Exact Date)   SpO2 92%   BMI 38.62 kg/m    Wt Readings from Last 3 Encounters:  05/04/24 225 lb (102.1 kg)  04/26/24 225 lb (102.1 kg)  04/21/24 229 lb 12.8 oz (104.2 kg)    GEN: Well nourished, well developed in no acute distress NECK: No JVD; No carotid bruits CARDIAC: RRR, no murmurs, rubs, gallops RESPIRATORY:  Clear to auscultation without rales, wheezing or rhonchi  ABDOMEN: Soft, non-tender, non-distended EXTREMITIES:  No edema; No deformity   ASSESSMENT AND PLAN: .   HFpEF with an LVEF 55%, no RWMA, right ventricular systolic function is moderately reduced as of this is severely enlarged, moderately elevated pulmonary artery systolic pressure with right ventricular systolic pressure measuring 52.1 mmHg, no valvular abnormalities were noted.  She has been continued on losartan  12.5 mg daily bisoprolol  2.5 mg daily, avoided Toprol -XL at this time with longstanding history of asthma.  She will have repeat labs today of CBC and a BMP.  She has been scheduled for a right heart catheterization to understand hemodynamics better.  We did discuss initiation of MRA therapy and SGLT2 inhibitors.  She  has amendable to trying either medication after her procedure has been completed.  With occasional cough and postnasal drip she has been advised that it is safe for her to take plain Mucinex if she feels as though she needs an expectorant, Delsym for cough, and for postnasal drip that is safe for her to take antihistamine of Claritin or Zyrtec without the D or decongestant.  Pulmonary hypertension noted to be moderately increased on recent echocardiogram.  Right heart catheterization scheduled for further evaluation to understand hemodynamics for better treatment.  She continues to follow with pulmonary who is in agreement with the need for right heart catheterization.  Primary hypertension with blood pressure today of 130/70.  Blood pressures remain stable.  She has been continued on losartan  12.5 mg daily, furosemide  20 mg daily and bisoprolol  2.5 mg daily.  She has been encouraged to continue to monitor blood pressure 1 to 2 hours postmedication administration as well.  She has noted several days her blood pressure may be in the mid to upper 90s systolic, which further discussion today she was advised that that is acceptable as she is asymptomatic.  Pure hypercholesterolemia with an LDL of 141.  Goal is less than 100.  Lites, modification to reduce risk factors was recommended at this time.  10-year ASCVD risk is 1.2% and the lifetime risk of CVD is 39%.  Obstructive sleep apnea which she is compliant with CPAP.    Morbid obesity with a BMI of 38.62.  Continues to decrease.  Congratulated on continued weight loss.      Informed Consent   Shared Decision Making/Informed Consent The risks [stroke (1 in 1000), death (1 in 1000), bleeding (1 in 200), allergic reaction [possibly serious] (1 in 200)], benefits (diagnostic support and management of coronary artery disease) and alternatives of a cardiac catheterization were discussed in detail with Ms. Burgueno and she is willing to proceed.     Dispo:  Patient return to clinic to see MD/APP 2 to 3 weeks postprocedure  Signed, Abbigail Anstey, NP

## 2024-05-04 NOTE — Patient Instructions (Signed)
 Medication Instructions:  Your Physician recommend you continue on your current medication as directed.    *If you need a refill on your cardiac medications before your next appointment, please call your pharmacy*  Lab Work: Your provider would like for you to have following labs drawn today CBC, BMP.   If you have labs (blood work) drawn today and your tests are completely normal, you will receive your results only by: MyChart Message (if you have MyChart) OR A paper copy in the mail If you have any lab test that is abnormal or we need to change your treatment, we will call you to review the results.  Testing/Procedures:  [place] [facility] DEPT OF [place] [place] [name] [place] [address] Dept: [phone] Loc: [phone]  Virginia Camacho  05/04/2024  You are scheduled for a Cardiac Catheterization on Tuesday, May 10 with Dr. Veryl Gottron End.  1. Please arrive at the [facility] Entrance of [facility], [address], [place] [address] at 7:30 AM (This is 1 hour(s) prior to your procedure time).  Proceed to the Check-In Desk directly inside the entrance.  Procedure Parking: Use the entrance off of the [address] side of the hospital. Turn right upon entering and follow the driveway to parking that is directly in front of the Heart & Vascular Center. There is no valet parking available at this entrance, however there is an awning directly in front of the Heart & Vascular Center for drop off/ pick up for patients.  Special note: Every effort is made to have your procedure done on time. Please understand that emergencies sometimes delay scheduled procedures.  2. Diet: Do not eat solid foods after midnight.  The patient may have clear liquids until 5am upon the day of the procedure.  3. Labs: You will need to have blood drawn on Tuesday, May 27   4. Medication instructions in  preparation for your procedure:   Contrast Allergy: No  On the morning of your procedure, Hold LASIX  until after procedure. You may use sips of water.  5. Plan to go home the same day, you will only stay overnight if medically necessary. 6. Bring a current list of your medications and current insurance cards. 7. You MUST have a responsible person to drive you home. 8. Someone MUST be with you the first 24 hours after you arrive home or your discharge will be delayed. 9. Please wear clothes that are easy to get on and off and wear slip-on shoes.  Thank you for allowing us  to care for you!   -- [facility]   Follow-Up: At [facility], you and your health needs are our priority.  As part of our continuing mission to provide you with exceptional heart care, our providers are all part of one team.  This team includes your primary Cardiologist (physician) and Advanced Practice Providers or APPs (Physician Assistants and Nurse Practitioners) who all work together to provide you with the care you need, when you need it.  Your next appointment:   3 week(s)  Provider:   Constancia Delton, MD or Ronald Cockayne, NP

## 2024-05-04 NOTE — H&P (View-Only) (Signed)
 Cardiology Office Note:  .   Date:  05/04/2024  ID:  [name], DOB [birth year], MRN [id number] PCP: Quinton Buckler, FNP  [facility] Providers Cardiologist:  Constancia Delton, MD    History of Present Illness: .   Virginia Camacho is a 43 y.o. female with past medical history of hyperlipidemia, morbid obesity, former smoker x 15 years, atypical chest pain, asthma, emphysema, struct of sleep apnea, chronic migraines, chronic back pain, HFpEF, pulmonary hypertension moderately increased on recent echocardiogram, who is here today for follow-up.   She had previously been referred to cardiology for evaluation of chest pain with no prior cardiac history of recurrent shortness of breath that have been happening for the last year when she was evaluated treated by pulmonary.  She was seen in clinic 06/18/2023.  He was scheduled for an echocardiogram and a coronary CTA.  Unfortunately neither of those tests were completed at this time.  She presented to [facility] with complaints of shortness of breath on 03/11/2024.  She has history of asthma and recent diagnosis of emphysema.  She was noted to be at her PCPs office with oxygen saturations in the 70s and she was given albuterol  and steroids and admitted for further evaluation.  She was treated for acute respiratory failure with hypoxia and HFpEF exacerbation.  2D echo revealed indeterminate diastolic filling, LVEF 55%, no RWMA,  RA volume overload mildly reduced systolic function with severe RV enlargement and moderately elevated pulmonary artery systolic pressure.  She was treated with IV Lasix  and was diuresed about 10 pounds.  Losartan  was added to her medication regimen along with oral furosemide  twice daily.  She was educated and encouraged to continue with CPAP regularly to prevent further right heart failure.  She also had mediastinal masslike opacity noted on resolution CT recently ordered by pulmonary with  recommendation to continue with outpatient follow-up.  She was considered stable and discharged 03/17/24.    She was last seen in clinic/16/25 of.  She was extremely concerned over her heart failure diagnoses in her hospital.  She was diuresed and approximately lost 10 pounds.  She was started on GDMT and they had several questions related to her current medication regimen.  Furosemide  was decreased to 20 mg daily versus twice daily.  Losartan  was decreased to 12.5 mg daily and she was encouraged to continue to monitor pressure 1 to 2 hours postmedication administration.  She also had an abnormal chest CT with mediastinal masslike opacity noted on high-resolution CT scan and was encouraged to continue to keep her follow-up with pulmonary.  She returns to clinic today accompanied by her friend.  She states overall she has been doing well.  Weight has remained stable, shortness of breath has slightly improved, no further instances of peripheral edema, no chest pain, no palpitations.  She is tolerating medication changes previously made with the only adverse side effect noted is dry skin that has slightly improved with decreasing furosemide  at last visit.  She has followed up with pulmonary.  She has noted occasional cough with sputum that is clear.  Possible postnasal drip.  She also states that she has been compliant with her CPAP.  She denies any hospitalizations or visits to the emergency department.  ROS: 10 point review of systems has been reviewed and considered negative except ones been listed in HPI  Studies Reviewed: .        2D echo 03/12/2024 1. Left ventricular ejection fraction, by estimation, is  55%. The left  ventricle has normal function. The left ventricle has no regional wall  motion abnormalities. Indeterminate diastolic filling due to E-A fusion.   2. D-shaped septum suggestive of RV pressure/volume overload. Right  ventricular systolic function is moderately reduced. The right  ventricular  size is severely enlarged. There is moderately elevated pulmonary artery  systolic pressure. The estimated  right ventricular systolic pressure is 52.1 mmHg.   3. Right atrial size was mildly dilated.   4. The mitral valve is normal in structure. No evidence of mitral valve  regurgitation. No evidence of mitral stenosis.   5. The aortic valve is tricuspid. Aortic valve regurgitation is not  visualized. No aortic stenosis is present.   6. The inferior vena cava is dilated in size with >50% respiratory  variability, suggesting right atrial pressure of 8 mmHg.    Risk Assessment/Calculations:             Physical Exam:   VS:  BP 130/78 (BP Location: Left Arm, Patient Position: Sitting, Cuff Size: Large)   Pulse 90   Ht 5\' 4"  (1.626 m)   Wt 225 lb (102.1 kg)   LMP 04/10/2024 (Exact Date)   SpO2 92%   BMI 38.62 kg/m    Wt Readings from Last 3 Encounters:  05/04/24 225 lb (102.1 kg)  04/26/24 225 lb (102.1 kg)  04/21/24 229 lb 12.8 oz (104.2 kg)    GEN: Well nourished, well developed in no acute distress NECK: No JVD; No carotid bruits CARDIAC: RRR, no murmurs, rubs, gallops RESPIRATORY:  Clear to auscultation without rales, wheezing or rhonchi  ABDOMEN: Soft, non-tender, non-distended EXTREMITIES:  No edema; No deformity   ASSESSMENT AND PLAN: .   HFpEF with an LVEF 55%, no RWMA, right ventricular systolic function is moderately reduced as of this is severely enlarged, moderately elevated pulmonary artery systolic pressure with right ventricular systolic pressure measuring 52.1 mmHg, no valvular abnormalities were noted.  She has been continued on losartan  12.5 mg daily bisoprolol  2.5 mg daily, avoided Toprol -XL at this time with longstanding history of asthma.  She will have repeat labs today of CBC and a BMP.  She has been scheduled for a right heart catheterization to understand hemodynamics better.  We did discuss initiation of MRA therapy and SGLT2 inhibitors.  She  has amendable to trying either medication after her procedure has been completed.  With occasional cough and postnasal drip she has been advised that it is safe for her to take plain Mucinex if she feels as though she needs an expectorant, Delsym for cough, and for postnasal drip that is safe for her to take antihistamine of Claritin or Zyrtec without the D or decongestant.  Pulmonary hypertension noted to be moderately increased on recent echocardiogram.  Right heart catheterization scheduled for further evaluation to understand hemodynamics for better treatment.  She continues to follow with pulmonary who is in agreement with the need for right heart catheterization.  Primary hypertension with blood pressure today of 130/70.  Blood pressures remain stable.  She has been continued on losartan  12.5 mg daily, furosemide  20 mg daily and bisoprolol  2.5 mg daily.  She has been encouraged to continue to monitor blood pressure 1 to 2 hours postmedication administration as well.  She has noted several days her blood pressure may be in the mid to upper 90s systolic, which further discussion today she was advised that that is acceptable as she is asymptomatic.  Pure hypercholesterolemia with an LDL of 141.  Goal is less than 100.  Lites, modification to reduce risk factors was recommended at this time.  10-year ASCVD risk is 1.2% and the lifetime risk of CVD is 39%.  Obstructive sleep apnea which she is compliant with CPAP.    Morbid obesity with a BMI of 38.62.  Continues to decrease.  Congratulated on continued weight loss.      Informed Consent   Shared Decision Making/Informed Consent The risks [stroke (1 in 1000), death (1 in 1000), bleeding (1 in 200), allergic reaction [possibly serious] (1 in 200)], benefits (diagnostic support and management of coronary artery disease) and alternatives of a cardiac catheterization were discussed in detail with Ms. Burgueno and she is willing to proceed.     Dispo:  Patient return to clinic to see MD/APP 2 to 3 weeks postprocedure  Signed, Abbigail Anstey, NP

## 2024-05-05 ENCOUNTER — Telehealth: Payer: Self-pay | Admitting: Cardiology

## 2024-05-05 ENCOUNTER — Ambulatory Visit: Payer: Self-pay

## 2024-05-05 DIAGNOSIS — Z79899 Other long term (current) drug therapy: Secondary | ICD-10-CM

## 2024-05-05 LAB — BASIC METABOLIC PANEL WITH GFR
BUN/Creatinine Ratio: 16 (ref 9–23)
BUN: 16 mg/dL (ref 6–24)
CO2: 17 mmol/L — ABNORMAL LOW (ref 20–29)
Calcium: 10.2 mg/dL (ref 8.7–10.2)
Chloride: 104 mmol/L (ref 96–106)
Creatinine, Ser: 1 mg/dL (ref 0.57–1.00)
Glucose: 82 mg/dL (ref 70–99)
Potassium: 5.8 mmol/L (ref 3.5–5.2)
Sodium: 139 mmol/L (ref 134–144)
eGFR: 72 mL/min/{1.73_m2} (ref >59)

## 2024-05-05 LAB — CBC
Hematocrit: 57.9 % — ABNORMAL HIGH (ref 34.0–46.6)
Hemoglobin: 18.4 g/dL — ABNORMAL HIGH (ref 11.1–15.9)
MCH: 25.8 pg — ABNORMAL LOW (ref 26.6–33.0)
MCHC: 31.8 g/dL (ref 31.5–35.7)
MCV: 81 fL (ref 79–97)
Platelets: 253 10*3/uL (ref 150–450)
RBC: 7.13 x10E6/uL (ref 3.77–5.28)
RDW: 19.7 % — ABNORMAL HIGH (ref 11.7–15.4)
WBC: 4.8 10*3/uL (ref 3.4–10.8)

## 2024-05-05 MED ORDER — LOKELMA 10 G PO PACK: 10.0000 g | PACK | Freq: Every day | ORAL | Status: AC

## 2024-05-05 NOTE — Progress Notes (Signed)
 Yes to stopping potassium supplements.

## 2024-05-05 NOTE — Progress Notes (Signed)
 Serum potassium elevated at 5.8.  Recommend taking Lokelma 10 grams orally daily times two days.  Decrease high potassium foods.  Repeat BMP Friday.

## 2024-05-05 NOTE — Telephone Encounter (Signed)
 Received a call from [facility] reporting the following critical lab.  Potassium- 5.8  Per chart review, NP has already been made aware and provided recommendations. Please see result note.

## 2024-05-05 NOTE — Telephone Encounter (Signed)
   Reached out to patient in regards to an elevated potassium level on her recent labs. She denies any symptoms. No N/V/D, cramps, palpitations, chest pain, shortness of breath, weakness, syncope. She reports that she has been taking her potassium supplementation daily. She was instructed to hold today and repeat her potassium level. She verbalizing understanding and will get her potasium drawn at her earliest convenience.  Jiles Mote, PA-C 05/05/2024 7:17 AM

## 2024-05-05 NOTE — Telephone Encounter (Signed)
 [facility] calling to report critical lab results. Please advise

## 2024-05-05 NOTE — Telephone Encounter (Signed)
 Called patient advise of elevated potassium - samples of lokelma 10 mg pulled for pick up by Eloisa Hait, patient will come to hospital to have labs collected on Friday

## 2024-05-07 ENCOUNTER — Other Ambulatory Visit: Payer: Self-pay | Admitting: Emergency Medicine

## 2024-05-07 DIAGNOSIS — Z79899 Other long term (current) drug therapy: Secondary | ICD-10-CM

## 2024-05-08 ENCOUNTER — Telehealth: Payer: Self-pay | Admitting: Student in an Organized Health Care Education/Training Program

## 2024-05-08 ENCOUNTER — Other Ambulatory Visit: Payer: Self-pay | Admitting: Cardiology

## 2024-05-08 DIAGNOSIS — E875 Hyperkalemia: Secondary | ICD-10-CM

## 2024-05-08 LAB — BASIC METABOLIC PANEL WITH GFR
BUN/Creatinine Ratio: 19 (ref 9–23)
BUN: 21 mg/dL (ref 6–24)
CO2: 16 mmol/L — ABNORMAL LOW (ref 20–29)
Calcium: 9.2 mg/dL (ref 8.7–10.2)
Chloride: 104 mmol/L (ref 96–106)
Creatinine, Ser: 1.13 mg/dL — ABNORMAL HIGH (ref 0.57–1.00)
Glucose: 83 mg/dL (ref 70–99)
Potassium: 5.8 mmol/L (ref 3.5–5.2)
Sodium: 140 mmol/L (ref 134–144)
eGFR: 62 mL/min/{1.73_m2} (ref >59)

## 2024-05-08 MED ORDER — LOKELMA 10 G PO PACK: 10.0000 g | PACK | Freq: Two times a day (BID) | ORAL | 0 refills | Status: DC

## 2024-05-08 MED ORDER — LOKELMA 10 G PO PACK: 10.0000 g | PACK | Freq: Two times a day (BID) | ORAL | Status: AC

## 2024-05-08 MED ORDER — LOKELMA 10 G PO PACK: 10.0000 g | PACK | Freq: Every day | ORAL | 0 refills | Status: AC

## 2024-05-08 NOTE — Addendum Note (Signed)
 Addended by: Johnie Nailer B on: 05/08/2024 01:54 PM   Modules accepted: Orders

## 2024-05-08 NOTE — Progress Notes (Addendum)
 Resent Rx. Patient called back regarding issues with filling the meds at pharmacy. I spoke with the pharmacy, they do have the medication but her insurance will not cover dose. [facility] does not carry kayexalate. Also spoke with our [facility] outpatient pharmacy, they do not carry lokelma  or kayexalate. Updated patient regarding issues, she is willing to pay out of pocket for meds with plans for recheck of labs tomorrow as initially planned.

## 2024-05-08 NOTE — Telephone Encounter (Signed)
 Paged that kayexalate and/or lokelma  not at pharmacy for pickup. Insurance rejected coverage. Took W/R once daily (10 gm) and had recheck K 5.8 yesterday same from 5.8 prior. Called in to local pharmacy, she is in [place]. Will pick up in the morning and take once in the morning and second dose Monday. Instead of labs tomorrow will get Monday. Cash pay ~60 dollars.

## 2024-05-08 NOTE — Progress Notes (Addendum)
 Received sign out from fellow regarding outpatient call from [facility] regarding K + 5.8 (possible hemolysis) from office yesterday. Spoke with patient, reports she is still taking losartan . No K+ supplement. Advised to stop the losartan , will re-dose with Lokelma  10mg  x2 with plans for repeat labs here at [facility] tomorrow. She voiced understanding and thanked me for callback.

## 2024-05-09 ENCOUNTER — Ambulatory Visit: Payer: Self-pay | Admitting: Cardiology

## 2024-05-09 ENCOUNTER — Other Ambulatory Visit: Payer: Self-pay | Admitting: Cardiology

## 2024-05-09 DIAGNOSIS — E875 Hyperkalemia: Secondary | ICD-10-CM

## 2024-05-09 NOTE — Progress Notes (Signed)
 Potassium remains elevated even after two doses of Lokelma . Start Lokelma  5 grams twice daily for two days and repeat BMP Wednesday of this week.

## 2024-05-10 ENCOUNTER — Telehealth: Payer: Self-pay | Admitting: Cardiology

## 2024-05-10 ENCOUNTER — Other Ambulatory Visit (HOSPITAL_COMMUNITY): Payer: Self-pay

## 2024-05-10 MED ORDER — LOKELMA 10 G PO PACK: PACK | ORAL | Status: DC

## 2024-05-10 NOTE — Telephone Encounter (Signed)
 Patient called to report she was not able to get the medication until yesterday and wants to know if she still needs to do the lab or can she get her blood work done after 4:00 pm today.

## 2024-05-10 NOTE — Telephone Encounter (Signed)
 Called patient, LVM to call back.  Left call back number.

## 2024-05-10 NOTE — Telephone Encounter (Signed)
 Called patient, advised of results from blood work. Patient will take Lokelma  5 grams twice daily for 2 days (per result) lab work for Wednesday (lab order placed) patient verbalized understanding.   She requested samples of Lokelma  as she had to pay out of pocket for it previously.   Samples of this drug were given to the patient Lokelma , quantity 2 packets (10 grams per packet), Lot Number [id number] EXP: 08/08/2024

## 2024-05-12 ENCOUNTER — Telehealth: Payer: Self-pay | Admitting: Pharmacy Technician

## 2024-05-12 ENCOUNTER — Other Ambulatory Visit
Admission: RE | Admit: 2024-05-12 | Discharge: 2024-05-12 | Disposition: A | Discharge status: Home / Self Care | Source: Ambulatory Visit | Attending: Cardiology | Admitting: Cardiology

## 2024-05-12 DIAGNOSIS — I272 Pulmonary hypertension, unspecified: Secondary | ICD-10-CM | POA: Insufficient documentation

## 2024-05-12 LAB — BASIC METABOLIC PANEL WITH GFR
Anion gap: 11 (ref 5–15)
BUN: 18 mg/dL (ref 6–20)
CO2: 22 mmol/L (ref 22–32)
Calcium: 9.3 mg/dL (ref 8.9–10.3)
Chloride: 106 mmol/L (ref 98–111)
Creatinine, Ser: 0.98 mg/dL (ref 0.44–1.00)
GFR, Estimated: >60 mL/min (ref >60)
Glucose, Bld: 85 mg/dL (ref 70–99)
Potassium: 4.2 mmol/L (ref 3.5–5.1)
Sodium: 139 mmol/L (ref 135–145)

## 2024-05-12 NOTE — Telephone Encounter (Signed)
   Per note:  Called patient, advised of results from blood work. Patient will take Lokelma  5 grams twice daily for 2 days (per result) lab work for Wednesday (lab order placed) patient verbalized understanding.    She requested samples of Lokelma  as she had to pay out of pocket for it previously.    Samples of this drug were given to the patient Lokelma , quantity 2 packets (10 grams per packet), Lot Number [id number] EXP: 08/08/2024     I called the pharmacy [phone] to let them know to cancel pa request since she got samples. I had to leave them a message since I was hung up on twice when being connected to the pharmacy

## 2024-05-13 ENCOUNTER — Ambulatory Visit: Payer: Self-pay | Admitting: Cardiology

## 2024-05-18 ENCOUNTER — Ambulatory Visit
Admission: RE | Admit: 2024-05-18 | Discharge: 2024-05-18 | Disposition: A | Discharge status: Home / Self Care | Attending: Internal Medicine | Admitting: Internal Medicine

## 2024-05-18 ENCOUNTER — Ambulatory Visit: Payer: Self-pay

## 2024-05-18 ENCOUNTER — Encounter: Payer: Self-pay | Admitting: Internal Medicine

## 2024-05-18 ENCOUNTER — Other Ambulatory Visit: Payer: Self-pay

## 2024-05-18 ENCOUNTER — Encounter
Admission: RE | Disposition: A | Discharge status: Home / Self Care | Payer: Self-pay | Source: Home / Self Care | Attending: Internal Medicine

## 2024-05-18 DIAGNOSIS — I110 Hypertensive heart disease with heart failure: Secondary | ICD-10-CM | POA: Diagnosis not present

## 2024-05-18 DIAGNOSIS — G4733 Obstructive sleep apnea (adult) (pediatric): Secondary | ICD-10-CM | POA: Diagnosis not present

## 2024-05-18 DIAGNOSIS — I272 Pulmonary hypertension, unspecified: Secondary | ICD-10-CM

## 2024-05-18 DIAGNOSIS — I5030 Unspecified diastolic (congestive) heart failure: Secondary | ICD-10-CM

## 2024-05-18 DIAGNOSIS — Z79899 Other long term (current) drug therapy: Secondary | ICD-10-CM | POA: Diagnosis not present

## 2024-05-18 DIAGNOSIS — Z87891 Personal history of nicotine dependence: Secondary | ICD-10-CM | POA: Insufficient documentation

## 2024-05-18 DIAGNOSIS — E7800 Pure hypercholesterolemia, unspecified: Secondary | ICD-10-CM | POA: Insufficient documentation

## 2024-05-18 DIAGNOSIS — I5032 Chronic diastolic (congestive) heart failure: Secondary | ICD-10-CM | POA: Insufficient documentation

## 2024-05-18 DIAGNOSIS — Z6838 Body mass index (BMI) 38.0-38.9, adult: Secondary | ICD-10-CM | POA: Insufficient documentation

## 2024-05-18 DIAGNOSIS — Z01810 Encounter for preprocedural cardiovascular examination: Secondary | ICD-10-CM | POA: Diagnosis not present

## 2024-05-18 DIAGNOSIS — G8929 Other chronic pain: Secondary | ICD-10-CM | POA: Diagnosis not present

## 2024-05-18 HISTORY — PX: RIGHT HEART CATH: CATH118263

## 2024-05-18 LAB — POCT I-STAT EG7
Acid-base deficit: 4 mmol/L — ABNORMAL HIGH (ref 0.0–2.0)
Bicarbonate: 18.9 mmol/L — ABNORMAL LOW (ref 20.0–28.0)
Calcium, Ion: 1.22 mmol/L (ref 1.15–1.40)
HCT: 52 % — ABNORMAL HIGH (ref 36.0–46.0)
Hemoglobin: 17.7 g/dL — ABNORMAL HIGH (ref 12.0–15.0)
O2 Saturation: 67 %
Potassium: 3.8 mmol/L (ref 3.5–5.1)
Sodium: 139 mmol/L (ref 135–145)
TCO2: 20 mmol/L — ABNORMAL LOW (ref 22–32)
pCO2, Ven: 28.6 mmHg — ABNORMAL LOW (ref 44–60)
pH, Ven: 7.43 (ref 7.25–7.43)
pO2, Ven: 33 mmHg (ref 32–45)

## 2024-05-18 SURGERY — RIGHT HEART CATH
Anesthesia: Moderate Sedation | Laterality: Right

## 2024-05-18 MED ORDER — LIDOCAINE HCL (PF) 1 % IJ SOLN
INTRAMUSCULAR | Status: DC | PRN
  Administered 2024-05-18: 2 mL

## 2024-05-18 MED ORDER — LABETALOL HCL 5 MG/ML IV SOLN: 10.0000 mg | INTRAVENOUS | Status: DC | PRN

## 2024-05-18 MED ORDER — LIDOCAINE HCL 1 % IJ SOLN
INTRAMUSCULAR | Status: AC
  Filled 2024-05-18: qty 20

## 2024-05-18 MED ORDER — SODIUM CHLORIDE 0.9 % IV SOLN: 250.0000 mL | INTRAVENOUS | Status: DC | PRN

## 2024-05-18 MED ORDER — SODIUM CHLORIDE 0.9 % IV SOLN: INTRAVENOUS | Status: DC

## 2024-05-18 MED ORDER — SODIUM CHLORIDE 0.9% FLUSH: 3.0000 mL | INTRAVENOUS | Status: DC | PRN

## 2024-05-18 MED ORDER — HEPARIN (PORCINE) IN NACL 1000-0.9 UT/500ML-% IV SOLN
INTRAVENOUS | Status: DC | PRN
  Administered 2024-05-18 (×2): 500 mL

## 2024-05-18 MED ORDER — SODIUM CHLORIDE 0.9% FLUSH: 3.0000 mL | Freq: Two times a day (BID) | INTRAVENOUS | Status: DC

## 2024-05-18 MED ORDER — HYDRALAZINE HCL 20 MG/ML IJ SOLN: 10.0000 mg | INTRAMUSCULAR | Status: DC | PRN

## 2024-05-18 MED ORDER — ACETAMINOPHEN 325 MG PO TABS
650.0000 mg | ORAL_TABLET | ORAL | Status: DC | PRN
Start: 2024-05-18 — End: 2024-05-18

## 2024-05-18 SURGICAL SUPPLY — 6 items
CATH BALLN WEDGE 5F 110CM (CATHETERS) IMPLANT
DRAPE BRACHIAL (DRAPES) IMPLANT
PACK CARDIAC CATH (CUSTOM PROCEDURE TRAY) ×1 IMPLANT
SET ATX-X65L (MISCELLANEOUS) IMPLANT
SHEATH GLIDE SLENDER 4/5FR (SHEATH) IMPLANT
STATION PROTECTION PRESSURIZED (MISCELLANEOUS) IMPLANT

## 2024-05-18 NOTE — Interval H&P Note (Signed)
 History and Physical Interval Note:  05/18/2024 9:13 AM  Marcel TILLEY FAETH  has presented today for surgery, with the diagnosis of pulmonary hypertension.  The various methods of treatment have been discussed with the patient and family. After consideration of risks, benefits and other options for treatment, the patient has consented to  Procedure(s): RIGHT HEART CATH (Right) as a surgical intervention.  The patient's history has been reviewed, patient examined, no change in status, stable for surgery.  I have reviewed the patient's chart and labs.  Questions were answered to the patient's satisfaction.     Latrisha Coiro

## 2024-05-19 ENCOUNTER — Other Ambulatory Visit: Payer: Self-pay | Admitting: Nurse Practitioner

## 2024-05-19 DIAGNOSIS — F419 Anxiety disorder, unspecified: Secondary | ICD-10-CM

## 2024-05-20 NOTE — Telephone Encounter (Signed)
 Requested Prescriptions  Pending Prescriptions Disp Refills   escitalopram  (LEXAPRO ) 10 MG tablet [Pharmacy Med Name: ESCITALOPRAM  10 MG TABLET] 30 tablet     Sig: TAKE 1 TABLET BY MOUTH EVERY DAY     Psychiatry:  Antidepressants - SSRI Passed - 05/20/2024 12:00 PM      Passed - Completed PHQ-2 or PHQ-9 in the last 360 days      Passed - Valid encounter within last 6 months    Recent Outpatient Visits           4 weeks ago Heart failure with preserved ejection fraction, unspecified HF chronicity [facility])   [facility] Quinton Buckler, FNP   2 months ago Hospital discharge follow-up   [facility] Quinton Buckler, FNP   2 months ago    [facility] Donny Gall F, FNP   2 months ago Moderate persistent asthma with acute exacerbation   [facility] Quinton Buckler, FNP       Future Appointments             In 3 weeks Ronald Cockayne, NP [facility]

## 2024-05-21 ENCOUNTER — Other Ambulatory Visit: Payer: Self-pay

## 2024-05-21 ENCOUNTER — Emergency Department

## 2024-05-21 ENCOUNTER — Observation Stay
Admission: EM | Admit: 2024-05-21 | Discharge: 2024-05-23 | Disposition: A | Discharge status: Home / Self Care | Attending: Internal Medicine | Admitting: Internal Medicine

## 2024-05-21 DIAGNOSIS — I272 Pulmonary hypertension, unspecified: Secondary | ICD-10-CM | POA: Diagnosis not present

## 2024-05-21 DIAGNOSIS — Z96651 Presence of right artificial knee joint: Secondary | ICD-10-CM | POA: Diagnosis not present

## 2024-05-21 DIAGNOSIS — E785 Hyperlipidemia, unspecified: Secondary | ICD-10-CM | POA: Diagnosis not present

## 2024-05-21 DIAGNOSIS — I5032 Chronic diastolic (congestive) heart failure: Secondary | ICD-10-CM | POA: Insufficient documentation

## 2024-05-21 DIAGNOSIS — I739 Peripheral vascular disease, unspecified: Secondary | ICD-10-CM | POA: Diagnosis not present

## 2024-05-21 DIAGNOSIS — I509 Heart failure, unspecified: Secondary | ICD-10-CM

## 2024-05-21 DIAGNOSIS — J189 Pneumonia, unspecified organism: Secondary | ICD-10-CM | POA: Insufficient documentation

## 2024-05-21 DIAGNOSIS — Z9889 Other specified postprocedural states: Secondary | ICD-10-CM

## 2024-05-21 DIAGNOSIS — R29702 NIHSS score 2: Secondary | ICD-10-CM

## 2024-05-21 DIAGNOSIS — H538 Other visual disturbances: Secondary | ICD-10-CM | POA: Diagnosis present

## 2024-05-21 DIAGNOSIS — Z87891 Personal history of nicotine dependence: Secondary | ICD-10-CM | POA: Diagnosis not present

## 2024-05-21 DIAGNOSIS — Z7982 Long term (current) use of aspirin: Secondary | ICD-10-CM | POA: Diagnosis not present

## 2024-05-21 DIAGNOSIS — I110 Hypertensive heart disease with heart failure: Secondary | ICD-10-CM | POA: Diagnosis not present

## 2024-05-21 DIAGNOSIS — Z7902 Long term (current) use of antithrombotics/antiplatelets: Secondary | ICD-10-CM | POA: Diagnosis not present

## 2024-05-21 DIAGNOSIS — Z79899 Other long term (current) drug therapy: Secondary | ICD-10-CM | POA: Diagnosis not present

## 2024-05-21 DIAGNOSIS — I6381 Other cerebral infarction due to occlusion or stenosis of small artery: Secondary | ICD-10-CM | POA: Diagnosis present

## 2024-05-21 DIAGNOSIS — G4730 Sleep apnea, unspecified: Secondary | ICD-10-CM | POA: Insufficient documentation

## 2024-05-21 DIAGNOSIS — J449 Chronic obstructive pulmonary disease, unspecified: Secondary | ICD-10-CM | POA: Diagnosis not present

## 2024-05-21 DIAGNOSIS — J4489 Other specified chronic obstructive pulmonary disease: Secondary | ICD-10-CM | POA: Diagnosis not present

## 2024-05-21 DIAGNOSIS — I6389 Other cerebral infarction: Principal | ICD-10-CM | POA: Insufficient documentation

## 2024-05-21 DIAGNOSIS — R270 Ataxia, unspecified: Secondary | ICD-10-CM | POA: Diagnosis not present

## 2024-05-21 DIAGNOSIS — J45909 Unspecified asthma, uncomplicated: Secondary | ICD-10-CM | POA: Diagnosis present

## 2024-05-21 DIAGNOSIS — I5030 Unspecified diastolic (congestive) heart failure: Secondary | ICD-10-CM | POA: Diagnosis present

## 2024-05-21 DIAGNOSIS — I639 Cerebral infarction, unspecified: Principal | ICD-10-CM | POA: Diagnosis present

## 2024-05-21 DIAGNOSIS — J9601 Acute respiratory failure with hypoxia: Secondary | ICD-10-CM | POA: Insufficient documentation

## 2024-05-21 LAB — PROTIME-INR
INR: 1.2 (ref 0.8–1.2)
Prothrombin Time: 15.4 s — ABNORMAL HIGH (ref 11.4–15.2)

## 2024-05-21 LAB — DIFFERENTIAL
Abs Immature Granulocytes: 0.01 10*3/uL (ref 0.00–0.07)
Basophils Absolute: 0 10*3/uL (ref 0.0–0.1)
Basophils Relative: 1 %
Eosinophils Absolute: 0.1 10*3/uL (ref 0.0–0.5)
Eosinophils Relative: 2 %
Immature Granulocytes: 0 %
Lymphocytes Relative: 26 %
Lymphs Abs: 1 10*3/uL (ref 0.7–4.0)
Monocytes Absolute: 0.3 10*3/uL (ref 0.1–1.0)
Monocytes Relative: 8 %
Neutro Abs: 2.3 10*3/uL (ref 1.7–7.7)
Neutrophils Relative %: 63 %

## 2024-05-21 LAB — CBC
HCT: 50.3 % — ABNORMAL HIGH (ref 36.0–46.0)
Hemoglobin: 16.1 g/dL — ABNORMAL HIGH (ref 12.0–15.0)
MCH: 25.8 pg — ABNORMAL LOW (ref 26.0–34.0)
MCHC: 32 g/dL (ref 30.0–36.0)
MCV: 80.5 fL (ref 80.0–100.0)
Platelets: 228 10*3/uL (ref 150–400)
RBC: 6.25 MIL/uL — ABNORMAL HIGH (ref 3.87–5.11)
RDW: 20.7 % — ABNORMAL HIGH (ref 11.5–15.5)
WBC: 3.7 10*3/uL — ABNORMAL LOW (ref 4.0–10.5)
nRBC: 0 % (ref 0.0–0.2)

## 2024-05-21 LAB — COMPREHENSIVE METABOLIC PANEL WITH GFR
ALT: 16 U/L (ref 0–44)
AST: 17 U/L (ref 15–41)
Albumin: 3.3 g/dL — ABNORMAL LOW (ref 3.5–5.0)
Alkaline Phosphatase: 21 U/L — ABNORMAL LOW (ref 38–126)
Anion gap: 9 (ref 5–15)
BUN: 17 mg/dL (ref 6–20)
CO2: 18 mmol/L — ABNORMAL LOW (ref 22–32)
Calcium: 8.5 mg/dL — ABNORMAL LOW (ref 8.9–10.3)
Chloride: 105 mmol/L (ref 98–111)
Creatinine, Ser: 0.86 mg/dL (ref 0.44–1.00)
GFR, Estimated: >60 mL/min (ref >60)
Glucose, Bld: 92 mg/dL (ref 70–99)
Potassium: 3.8 mmol/L (ref 3.5–5.1)
Sodium: 132 mmol/L — ABNORMAL LOW (ref 135–145)
Total Bilirubin: 1.5 mg/dL — ABNORMAL HIGH (ref 0.0–1.2)
Total Protein: 5.8 g/dL — ABNORMAL LOW (ref 6.5–8.1)

## 2024-05-21 LAB — POC URINE PREG, ED: Preg Test, Ur: NEGATIVE

## 2024-05-21 LAB — URINE DRUG SCREEN, QUALITATIVE (ARMC ONLY)
Amphetamines, Ur Screen: NOT DETECTED
Barbiturates, Ur Screen: NOT DETECTED
Benzodiazepine, Ur Scrn: NOT DETECTED
Cannabinoid 50 Ng, Ur ~~LOC~~: NOT DETECTED
Cocaine Metabolite,Ur ~~LOC~~: NOT DETECTED
MDMA (Ecstasy)Ur Screen: NOT DETECTED
Methadone Scn, Ur: NOT DETECTED
Opiate, Ur Screen: NOT DETECTED
Phencyclidine (PCP) Ur S: NOT DETECTED
Tricyclic, Ur Screen: NOT DETECTED

## 2024-05-21 LAB — APTT: aPTT: 32 s (ref 24–36)

## 2024-05-21 LAB — HEMOGLOBIN A1C
Hgb A1c MFr Bld: 5.6 % (ref 4.8–5.6)
Mean Plasma Glucose: 114.02 mg/dL

## 2024-05-21 LAB — ETHANOL: Alcohol, Ethyl (B): <15 mg/dL (ref <15)

## 2024-05-21 MED ORDER — IOHEXOL 350 MG/ML SOLN: 75.0000 mL | Freq: Once | INTRAVENOUS | Status: AC | PRN

## 2024-05-21 MED ORDER — ASPIRIN 81 MG PO TBEC
81.0000 mg | DELAYED_RELEASE_TABLET | Freq: Every day | ORAL | Status: DC
  Administered 2024-05-22 – 2024-05-23 (×2): 81 mg via ORAL
  Filled 2024-05-21 (×3): qty 1

## 2024-05-21 MED ORDER — SENNOSIDES-DOCUSATE SODIUM 8.6-50 MG PO TABS: 1.0000 | ORAL_TABLET | Freq: Every evening | ORAL | Status: DC | PRN

## 2024-05-21 MED ORDER — CLOPIDOGREL BISULFATE 75 MG PO TABS
300.0000 mg | ORAL_TABLET | Freq: Once | ORAL | Status: AC
  Filled 2024-05-21: qty 4

## 2024-05-21 MED ORDER — ACETAMINOPHEN 160 MG/5ML PO SOLN: 650.0000 mg | ORAL | Status: DC | PRN

## 2024-05-21 MED ORDER — SODIUM CHLORIDE 0.9% FLUSH: 3.0000 mL | INTRAVENOUS | Status: DC | PRN

## 2024-05-21 MED ORDER — HYDRALAZINE HCL 20 MG/ML IJ SOLN: 5.0000 mg | Freq: Four times a day (QID) | INTRAMUSCULAR | Status: DC | PRN

## 2024-05-21 MED ORDER — CLOPIDOGREL BISULFATE 75 MG PO TABS
75.0000 mg | ORAL_TABLET | Freq: Every day | ORAL | Status: DC
  Administered 2024-05-22 – 2024-05-23 (×2): 75 mg via ORAL
  Filled 2024-05-21 (×2): qty 1

## 2024-05-21 MED ORDER — ACETAMINOPHEN 325 MG PO TABS: 650.0000 mg | ORAL_TABLET | ORAL | Status: DC | PRN

## 2024-05-21 MED ORDER — ALBUTEROL SULFATE (2.5 MG/3ML) 0.083% IN NEBU: 3.0000 mL | INHALATION_SOLUTION | Freq: Four times a day (QID) | RESPIRATORY_TRACT | Status: DC | PRN

## 2024-05-21 MED ORDER — ENOXAPARIN SODIUM 60 MG/0.6ML IJ SOSY
50.0000 mg | PREFILLED_SYRINGE | INTRAMUSCULAR | Status: DC
  Administered 2024-05-21 – 2024-05-22 (×2): 50 mg via SUBCUTANEOUS
  Filled 2024-05-21 (×2): qty 0.6

## 2024-05-21 MED ORDER — GADOBUTROL 1 MMOL/ML IV SOLN: 10.0000 mL | Freq: Once | INTRAVENOUS | Status: AC | PRN

## 2024-05-21 MED ORDER — SODIUM CHLORIDE 0.9% FLUSH
3.0000 mL | Freq: Two times a day (BID) | INTRAVENOUS | Status: DC
  Administered 2024-05-21: 3 mL via INTRAVENOUS
  Administered 2024-05-22 – 2024-05-23 (×3): 10 mL via INTRAVENOUS

## 2024-05-21 MED ORDER — TIOTROPIUM BROMIDE MONOHYDRATE 2.5 MCG/ACT IN AERS: 2.0000 | INHALATION_SPRAY | Freq: Every day | RESPIRATORY_TRACT | Status: DC

## 2024-05-21 MED ORDER — UMECLIDINIUM BROMIDE 62.5 MCG/ACT IN AEPB
1.0000 | INHALATION_SPRAY | Freq: Every day | RESPIRATORY_TRACT | Status: DC
  Administered 2024-05-22 – 2024-05-23 (×2): 1 via RESPIRATORY_TRACT
  Filled 2024-05-21 (×2): qty 7

## 2024-05-21 MED ORDER — STROKE: EARLY STAGES OF RECOVERY BOOK: Freq: Once | Status: AC

## 2024-05-21 MED ORDER — BISOPROLOL FUMARATE 5 MG PO TABS
2.5000 mg | ORAL_TABLET | Freq: Every day | ORAL | Status: DC
  Administered 2024-05-22 – 2024-05-23 (×2): 2.5 mg via ORAL
  Filled 2024-05-21 (×4): qty 0.5

## 2024-05-21 MED ORDER — ESCITALOPRAM OXALATE 10 MG PO TABS
10.0000 mg | ORAL_TABLET | Freq: Every day | ORAL | Status: DC
  Administered 2024-05-22 – 2024-05-23 (×2): 10 mg via ORAL
  Filled 2024-05-21 (×3): qty 1

## 2024-05-21 MED ORDER — ACETAMINOPHEN 650 MG RE SUPP: 650.0000 mg | RECTAL | Status: DC | PRN

## 2024-05-21 MED ORDER — FLUTICASONE FUROATE-VILANTEROL 100-25 MCG/ACT IN AEPB
1.0000 | INHALATION_SPRAY | Freq: Every day | RESPIRATORY_TRACT | Status: DC
  Administered 2024-05-22 – 2024-05-23 (×2): 1 via RESPIRATORY_TRACT
  Filled 2024-05-21 (×2): qty 28

## 2024-05-21 NOTE — H&P (Signed)
 History and Physical    Virginia Camacho:[id number] DOB: 05-28-1981 DOA: 05/21/2024  PCP: Quinton Buckler, FNP (Confirm with patient/family/NH records and if not entered, this has to be entered at [facility] point of entry) Patient coming from: Home  I have personally briefly reviewed patient's old medical records in [facility]  Chief Complaint: Blurry vision  HPI: Virginia Camacho is a 43 y.o. female with medical history significant of asthma, COPD, OSA on CPAP, morbid obesity, chronic HFpEF, severe pulmonary hypertension, presented with sudden onset of vision problems.  Patient woke up this morning with blurry vision and unsteady gait on and off.  At the time I interviewed her, all of her and symptoms are relieved.  Denied any headache, no double vision no weakness numbness of any of the limbs.  3 days ago, she underwent right-sided heart cath to investigate pulmonary hypertension.  Reported that last few days she has been experiencing some wheezing and she has been using several rounds of albuterol  last night before went to bed.  But denies any fever chills no chest pains. ED Course: Afebrile, blood pressure 150/90 oxygenation 97% on room air.  CT head negative for acute findings CTA negative for LVO.  MRI showed no acute 18 mm acute infarct within the right thalamus and small chronic bilateral cerebellar infarct.  Review of Systems: As per HPI otherwise 14 point review of systems negative.    Past Medical History:  Diagnosis Date   Allergy    Asthma    Back pain    Diastolic heart failure (HCC) 03/2024   Emphysema lung (HCC)    Morbid obesity with BMI of 40.0-44.9, adult (HCC) 03/20/2016    Past Surgical History:  Procedure Laterality Date   KNEE SURGERY Right    RIGHT HEART CATH Right 05/18/2024   Procedure: RIGHT HEART CATH;  Surgeon: Sammy Crisp, MD;  Location: [facility] INVASIVE CV LAB;  Service: Cardiovascular;  Laterality: Right;     reports that she quit smoking about  3 years ago. Her smoking use included cigarettes. She started smoking about 23 years ago. She has a 5 pack-year smoking history. She has never used smokeless tobacco. She reports current alcohol use. She reports that she does not currently use drugs after having used the following drugs: Marijuana. Frequency: 1.00 time per week.  Allergies  Allergen Reactions   Clarithromycin Hives and Nausea Only    Hot   Oxycodone Hives    Family History  Problem Relation Age of Onset   Heart disease Mother    Diabetes Mother    Breast cancer Maternal Grandmother    Heart disease Maternal Grandfather      Prior to Admission medications   Medication Sig Start Date End Date Taking? Authorizing Provider  albuterol  (ACCUNEB ) 1.25 MG/3ML nebulizer solution Take 3 mLs (1.25 mg total) by nebulization every 6 (six) hours as needed for wheezing. 04/21/24   Pender, Julie F, FNP  albuterol  (VENTOLIN  HFA) 108 (90 Base) MCG/ACT inhaler Inhale 1-2 puffs into the lungs every 6 (six) hours as needed for wheezing or shortness of breath.    [provider]  bisoprolol  (ZEBETA ) 5 MG tablet Take 0.5 tablets (2.5 mg total) by mouth daily. 03/24/24   Ronald Cockayne, NP  cholecalciferol (VITAMIN D3) 25 MCG (1000 UNIT) tablet Take 1,000 Units by mouth daily.    [provider]  escitalopram  (LEXAPRO ) 10 MG tablet Take 1 tablet (10 mg total) by mouth daily. 04/21/24   Pender, Julie  F, FNP  ferrous sulfate  324 MG TBEC Take 324 mg by mouth daily.    [provider]  furosemide  (LASIX ) 20 MG tablet Take 1 tablet (20 mg total) by mouth daily. 03/24/24 03/24/25  Ronald Cockayne, NP  losartan  (COZAAR ) 25 MG tablet Take 0.5 tablets (12.5 mg total) by mouth daily. 03/24/24   Ronald Cockayne, NP  naproxen  (NAPROSYN ) 500 MG tablet Take 1 tablet (500 mg total) by mouth 2 (two) times daily with a meal. Patient taking differently: Take 500 mg by mouth 2 (two) times daily as needed for mild pain (pain score 1-3) or  moderate pain (pain score 4-6). 04/21/24   Quinton Buckler, FNP  NON FORMULARY Pt uses a c-pap nightly    [provider]  potassium chloride  (KLOR-CON ) 10 MEQ tablet Take 1 tablet (10 mEq total) by mouth daily. Patient not taking: Reported on 05/12/2024 04/14/24 04/14/25  Furth, Cadence H, PA-C  sodium zirconium cyclosilicate  (LOKELMA ) 10 g PACK packet Take 5 grams twice daily for 2 days Patient not taking: Reported on 05/12/2024 05/10/24   Ronald Cockayne, NP  Tiotropium Bromide Monohydrate  (SPIRIVA  RESPIMAT) 2.5 MCG/ACT AERS Inhale 2 puffs into the lungs daily. 02/25/24   [name], MD  Zachery Hermes INHUB 100-50 MCG/ACT AEPB INHALE 1 DOSE BY MOUTH TWICE A DAY 01/30/24   Quinton Buckler, FNP    Physical Exam: Vitals:   05/21/24 1005 05/21/24 1030 05/21/24 1100 05/21/24 1300  BP: (!) 139/100 (!) 143/93 (!) 133/93 (!) 144/99  Pulse: 77 77 81 81  Resp: 16 (!) 21 (!) 21 (!) 21  Temp:      TempSrc:      SpO2:  90% 92% 93%    Constitutional: NAD, calm, comfortable Vitals:   05/21/24 1005 05/21/24 1030 05/21/24 1100 05/21/24 1300  BP: (!) 139/100 (!) 143/93 (!) 133/93 (!) 144/99  Pulse: 77 77 81 81  Resp: 16 (!) 21 (!) 21 (!) 21  Temp:      TempSrc:      SpO2:  90% 92% 93%   Eyes: PERRL, lids and conjunctivae normal ENMT: Mucous membranes are moist. Posterior pharynx clear of any exudate or lesions.Normal dentition.  Neck: normal, supple, no masses, no thyromegaly Respiratory: clear to auscultation bilaterally, no wheezing, no crackles. Normal respiratory effort. No accessory muscle use.  Cardiovascular: Regular rate and rhythm, no murmurs / rubs / gallops. No extremity edema. 2+ pedal pulses. No carotid bruits.  Abdomen: no tenderness, no masses palpated. No hepatosplenomegaly. Bowel sounds positive.  Musculoskeletal: no clubbing / cyanosis. No joint deformity upper and lower extremities. Good ROM, no contractures. Normal muscle tone.  Skin: no rashes, lesions, ulcers. No  induration Neurologic: CN 2-12 grossly intact. Sensation intact, DTR normal. Strength 5/5 in all 4.  Psychiatric: Normal judgment and insight. Alert and oriented x 3. Normal mood.     Labs on Admission: I have personally reviewed following labs and imaging studies  CBC: Recent Labs  Lab 05/18/24 0937 05/21/24 0839  WBC  --  3.7*  NEUTROABS  --  2.3  HGB 17.7* 16.1*  HCT 52.0* 50.3*  MCV  --  80.5  PLT  --  228   Basic Metabolic Panel: Recent Labs  Lab 05/18/24 0937 05/21/24 0839  NA 139 132*  K 3.8 3.8  CL  --  105  CO2  --  18*  GLUCOSE  --  92  BUN  --  17  CREATININE  --  0.86  CALCIUM  --  8.5*   GFR: Estimated Creatinine Clearance: 98.7 mL/min (by C-G formula based on SCr of 0.86 mg/dL). Liver Function Tests: Recent Labs  Lab 05/21/24 0839  AST 17  ALT 16  ALKPHOS 21*  BILITOT 1.5*  PROT 5.8*  ALBUMIN 3.3*   No results for input(s): LIPASE, AMYLASE in the last 168 hours. No results for input(s): AMMONIA in the last 168 hours. Coagulation Profile: Recent Labs  Lab 05/21/24 0839  INR 1.2   Cardiac Enzymes: No results for input(s): CKTOTAL, CKMB, CKMBINDEX, TROPONINI in the last 168 hours. BNP (last 3 results) No results for input(s): PROBNP in the last 8760 hours. HbA1C: No results for input(s): HGBA1C in the last 72 hours. CBG: No results for input(s): GLUCAP in the last 168 hours. Lipid Profile: No results for input(s): CHOL, HDL, LDLCALC, TRIG, CHOLHDL, LDLDIRECT in the last 72 hours. Thyroid Function Tests: No results for input(s): TSH, T4TOTAL, FREET4, T3FREE, THYROIDAB in the last 72 hours. Anemia Panel: No results for input(s): VITAMINB12, FOLATE, FERRITIN, TIBC, IRON , RETICCTPCT in the last 72 hours. Urine analysis: No results found for: COLORURINE, APPEARANCEUR, LABSPEC, PHURINE, GLUCOSEU, HGBUR, BILIRUBINUR, KETONESUR, PROTEINUR, UROBILINOGEN, NITRITE,  LEUKOCYTESUR  Radiological Exams on Admission: MR BRAIN W WO CONTRAST Result Date: 05/21/2024 CLINICAL DATA:  Provided history: Diplopia. EXAM: MRI HEAD AND ORBITS WITHOUT AND WITH CONTRAST TECHNIQUE: Multiplanar, multiecho pulse sequences of the brain and surrounding structures were obtained without and with intravenous contrast. Multiplanar, multiecho pulse sequences of the orbits and surrounding structures were obtained including fat saturation techniques, before and after intravenous contrast administration. CONTRAST:  10mL GADAVIST GADOBUTROL 1 MMOL/ML IV SOLN COMPARISON:  Non-contrast head CT and CT angiogram head/neck performed earlier today 05/21/2024. FINDINGS: MRI HEAD FINDINGS Brain: No age-advanced or lobar predominant cerebral atrophy. 18 mm acute infarct within the right thalamus (for instance as seen on series 5, images 24-28). Small chronic infarcts within the bilateral cerebellar hemispheres. Partial empty sella turcica. No cortical encephalomalacia is identified. No significant cerebral white matter disease. No evidence of an intracranial mass. No chronic intracranial blood products. No extra-axial fluid collection. No midline shift. No pathologic intracranial enhancement identified. Vascular: Maintained flow voids within the proximal large arterial vessels. Skull and upper cervical spine: No focal worrisome marrow lesion. MRI ORBITS FINDINGS Orbits: The globes are normal in size and contour. The extraocular muscles, optic nerve sheath complexes and lacrimal glands are symmetric and unremarkable. No orbital mass or pathologic orbital enhancement. Visualized sinuses: No significant paranasal sinus disease. Soft tissues: The visible maxillofacial and upper neck soft tissues are unremarkable. IMPRESSION: MRI brain: 1. 18 mm acute infarct within the right thalamus. 2. 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 3. Partially empty sella turcica. This finding can reflect  incidental anatomic variation, or alternatively, it can be associated with chronic idiopathic intracranial hypertension (pseudotumor cerebri). MRI orbits: Unremarkable MRI appearance of the orbits. Electronically Signed   By: Bascom Lily D.O.   On: 05/21/2024 13:15   MR ORBITS W WO CONTRAST Result Date: 05/21/2024 CLINICAL DATA:  Provided history: Diplopia. EXAM: MRI HEAD AND ORBITS WITHOUT AND WITH CONTRAST TECHNIQUE: Multiplanar, multiecho pulse sequences of the brain and surrounding structures were obtained without and with intravenous contrast. Multiplanar, multiecho pulse sequences of the orbits and surrounding structures were obtained including fat saturation techniques, before and after intravenous contrast administration. CONTRAST:  10mL GADAVIST GADOBUTROL 1 MMOL/ML IV SOLN COMPARISON:  Non-contrast head CT and CT angiogram head/neck performed earlier today 05/21/2024. FINDINGS: MRI HEAD FINDINGS Brain: No  age-advanced or lobar predominant cerebral atrophy. 18 mm acute infarct within the right thalamus (for instance as seen on series 5, images 24-28). Small chronic infarcts within the bilateral cerebellar hemispheres. Partial empty sella turcica. No cortical encephalomalacia is identified. No significant cerebral white matter disease. No evidence of an intracranial mass. No chronic intracranial blood products. No extra-axial fluid collection. No midline shift. No pathologic intracranial enhancement identified. Vascular: Maintained flow voids within the proximal large arterial vessels. Skull and upper cervical spine: No focal worrisome marrow lesion. MRI ORBITS FINDINGS Orbits: The globes are normal in size and contour. The extraocular muscles, optic nerve sheath complexes and lacrimal glands are symmetric and unremarkable. No orbital mass or pathologic orbital enhancement. Visualized sinuses: No significant paranasal sinus disease. Soft tissues: The visible maxillofacial and upper neck soft tissues are  unremarkable. IMPRESSION: MRI brain: 1. 18 mm acute infarct within the right thalamus. 2. 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 3. Partially empty sella turcica. This finding can reflect incidental anatomic variation, or alternatively, it can be associated with chronic idiopathic intracranial hypertension (pseudotumor cerebri). MRI orbits: Unremarkable MRI appearance of the orbits. Electronically Signed   By: Bascom Lily D.O.   On: 05/21/2024 13:15   CT ANGIO HEAD NECK W WO CM (CODE STROKE) Result Date: 05/21/2024 CLINICAL DATA:  Neuro deficit, concern for stroke, partial left facial paralysis. EXAM: CT ANGIOGRAPHY HEAD AND NECK WITH AND WITHOUT CONTRAST TECHNIQUE: Multidetector CT imaging of the head and neck was performed using the standard protocol during bolus administration of intravenous contrast. Multiplanar CT image reconstructions and MIPs were obtained to evaluate the vascular anatomy. Carotid stenosis measurements (when applicable) are obtained utilizing NASCET criteria, using the distal internal carotid diameter as the denominator. RADIATION DOSE REDUCTION: This exam was performed according to the departmental dose-optimization program which includes automated exposure control, adjustment of the mA and/or kV according to patient size and/or use of iterative reconstruction technique. CONTRAST:  75mL OMNIPAQUE  IOHEXOL  350 MG/ML SOLN COMPARISON:  Same-day head CT. FINDINGS: CTA NECK FINDINGS Aortic arch: 3 vessel configuration of the aortic arch with common origin of the brachiocephalic and left common carotid arteries and direct origin of the left vertebral artery on the aortic arch. Imaged portion of the aortic arch without aneurysmal dilatation or significant stenosis. No stenosis at the origins of the aortic arch vessels. Pulmonary arteries: As permitted by contrast timing, there are no filling defects in the visualized pulmonary arteries. Subclavian arteries: The  subclavian arteries are patent bilaterally. Right carotid system: No evidence of dissection, stenosis (50% or greater), or occlusion. Mild tortuosity of the cervical ICA without stenosis. Left carotid system: No evidence of dissection, stenosis (50% or greater), or occlusion. Mild tortuosity of the cervical ICA without stenosis. Vertebral arteries: Codominant. No evidence of dissection, stenosis (50% or greater), or occlusion. Skeleton: No acute or aggressive finding noted. Other neck: The visualized airway is patent. No cervical lymphadenopathy. Upper chest: Significant centrilobular emphysema in the visualized lungs. Review of the MIP images confirms the above findings CTA HEAD FINDINGS ANTERIOR CIRCULATION: The intracranial ICAs are patent bilaterally. No significant stenosis, proximal occlusion, aneurysm, or vascular malformation. MCAs: The middle cerebral arteries are patent bilaterally. ACAs: The anterior cerebral arteries are patent bilaterally. POSTERIOR CIRCULATION: No significant stenosis, proximal occlusion, aneurysm, or vascular malformation. PCAs: The posterior cerebral arteries are patent bilaterally. Pcomm: The posterior communicating arteries are visualized bilaterally. SCAs: The superior cerebellar arteries are patent bilaterally. Basilar artery: Patent AICAs: Patent PICAs: Patent Vertebral arteries: The  intracranial vertebral arteries are patent. Venous sinuses: As permitted by contrast timing, patent. Anatomic variants: None Review of the MIP images confirms the above findings IMPRESSION: No large vessel occlusion. No high-grade stenosis, aneurysm, or dissection of the arteries in the head and neck. Emphysema (ICD10-J43.9). These results were communicated to Dr. Alecia Ames at 8:48 am on 05/21/2024 by text page via the [facility] messaging system. Electronically Signed   By: Denny Flack M.D.   On: 05/21/2024 08:48   CT HEAD CODE STROKE WO CONTRAST Result Date: 05/21/2024 CLINICAL DATA:  Code  stroke. Vision disturbances and partial facial paralysis. EXAM: CT HEAD WITHOUT CONTRAST TECHNIQUE: Contiguous axial images were obtained from the base of the skull through the vertex without intravenous contrast. RADIATION DOSE REDUCTION: This exam was performed according to the departmental dose-optimization program which includes automated exposure control, adjustment of the mA and/or kV according to patient size and/or use of iterative reconstruction technique. COMPARISON:  CT of the head dated September 28, 2015. FINDINGS: Brain: The brain appears normal. There is no evidence of hemorrhage, mass, cortical infarct or hydrocephalus. Vascular: Unremarkable. Skull: Intact and unremarkable. Sinuses/Orbits: The visualized paranasal sinuses are clear. The orbits are unremarkable. Other: None. ASPECTS [facility] Stroke Program Early CT Score) - Ganglionic level infarction (caudate, lentiform nuclei, internal capsule, insula, M1-M3 cortex): 7 - Supraganglionic infarction (M4-M6 cortex): 3 Total score (0-10 with 10 being normal): 10 IMPRESSION: 1. Normal brain. 2. ASPECTS is 10. 3. These results were called by telephone at the time of interpretation on 05/21/2024 at 8:31 am to provider Dr. Alecia Ames, who verbally acknowledged these results. Electronically Signed   By: Maribeth Shivers M.D.   On: 05/21/2024 08:34    EKG: Independently reviewed.  Sinus rhythm, chronic RBBB  Assessment/Plan Principal Problem:   Stroke [facility]) Active Problems:   Stroke (cerebrum) (HCC)  (please populate well all problems here in Problem List. (For example, if patient is on BP meds at home and you resume or decide to hold them, it is a problem that needs to be her. Same for CAD, COPD, HLD and so on)  Acute right thalamus stroke Acute blurry vision Acute ataxia - Neurology consultation appreciated - Aspirin and Plavix - Telemonitoring x 24 hours to rule out PAF.  If negative, recommend patient outpatient follow-up with cardiology  for Zio patch monitoring to rule out PAF.  Explained to patient and mother over the phone, both agreed with the plan. - Allow permissive hypertension -A1c and lipid panel - Echocardiogram - PT OT evaluation  HTN - Hold off home BP meds - As needed hydralazine  for 200/110  Sleep apnea - Continue CPAP at bedtime  COPD Severe pulmonary hypertension - Outpatient follow-up with cardiology  Morbid obesity - Outpatient GLP-1 evaluation  DVT prophylaxis: Lovenox  Code Status: Full Family Communication: Mother over the phone Disposition Plan: Expect less than 2 midnight hospital stay Consults called: Neurology Admission status: Telemetry observation   [place] MD [facility] Pager [phone]  05/21/2024, 2:03 PM

## 2024-05-21 NOTE — ED Notes (Signed)
 Dr. Alecia Ames at bedside, no TNK

## 2024-05-21 NOTE — Consult Note (Signed)
 NEUROLOGY CONSULT NOTE   Date of service: May 21, 2024 Patient Name: MOSELLA KASA MRN:  [id number] DOB:  14-Feb-1981 Chief Complaint: Vision change Requesting Provider: Bryson Carbine, MD  History of Present Illness  Camilah JOVANA REMBOLD is a 43 y.o. female with hx of heart failure who presents with disconjugate gaze that was new onset on awakening around 6:30 AM.  She states that she got up at four and did not have the symptoms, but then when she awoke again she noticed that she was having difficulty focusing.  She presented to the emergency department where code stroke was activated.  On my assessment, she was only a couple of minutes away from being outside the 4-1/2-hour IV tenecteplase window.  At that point, I felt that the risks of IV tenecteplase were likely higher than the downside of patching or other interventions for ophthalmoplegia and that could be obtained at a later date.    LKW: 4 AM Modified rankin score: 0-Completely asymptomatic and back to baseline post- stroke IV Thrombolysis: No, mild symptoms(activation was also 15 minutes prior to the end of the TNK window) EVT: No, no LVO   NIHSS components Score: Comment  1a Level of Conscious 0[x]  1[]  2[]  3[]      1b LOC Questions 0[x]  1[]  2[]       1c LOC Commands 0[x]  1[]  2[]       2 Best Gaze 0[]  1[x]  2[]       3 Visual 0[x]  1[]  2[]  3[]      4 Facial Palsy 0[]  1[x]  2[]  3[]      5a Motor Arm - left 0[x]  1[]  2[]  3[]  4[]  UN[]    5b Motor Arm - Right 0[x]  1[]  2[]  3[]  4[]  UN[]    6a Motor Leg - Left 0[x]  1[]  2[]  3[]  4[]  UN[]    6b Motor Leg - Right 0[x]  1[]  2[]  3[]  4[]  UN[]    7 Limb Ataxia 0[x]  1[]  2[]  UN[]      8 Sensory 0[x]  1[]  2[]  UN[]      9 Best Language 0[x]  1[]  2[]  3[]      10 Dysarthria 0[x]  1[]  2[]  UN[]      11 Extinct. and Inattention 0[x]  1[]  2[]       TOTAL: 2      Past History   Past Medical History:  Diagnosis Date   Allergy    Asthma    Back pain    Diastolic heart failure (HCC) 03/2024   Emphysema lung (HCC)     Morbid obesity with BMI of 40.0-44.9, adult (HCC) 03/20/2016    Past Surgical History:  Procedure Laterality Date   KNEE SURGERY Right    RIGHT HEART CATH Right 05/18/2024   Procedure: RIGHT HEART CATH;  Surgeon: Sammy Crisp, MD;  Location: [facility] INVASIVE CV LAB;  Service: Cardiovascular;  Laterality: Right;    Family History: Family History  Problem Relation Age of Onset   Heart disease Mother    Diabetes Mother    Breast cancer Maternal Grandmother    Heart disease Maternal Grandfather     Social History  reports that she quit smoking about 3 years ago. Her smoking use included cigarettes. She started smoking about 23 years ago. She has a 5 pack-year smoking history. She has never used smokeless tobacco. She reports current alcohol use. She reports that she does not currently use drugs after having used the following drugs: Marijuana. Frequency: 1.00 time per week.  Allergies  Allergen Reactions   Clarithromycin Hives and Nausea Only    Hot   Oxycodone  Hives    Medications  No current facility-administered medications for this encounter.  Current Outpatient Medications:    albuterol  (ACCUNEB ) 1.25 MG/3ML nebulizer solution, Take 3 mLs (1.25 mg total) by nebulization every 6 (six) hours as needed for wheezing., Disp: 75 mL, Rfl: 12   albuterol  (VENTOLIN  HFA) 108 (90 Base) MCG/ACT inhaler, Inhale 1-2 puffs into the lungs every 6 (six) hours as needed for wheezing or shortness of breath., Disp: , Rfl:    bisoprolol  (ZEBETA ) 5 MG tablet, Take 0.5 tablets (2.5 mg total) by mouth daily., Disp: 45 tablet, Rfl: 3   cholecalciferol (VITAMIN D3) 25 MCG (1000 UNIT) tablet, Take 1,000 Units by mouth daily., Disp: , Rfl:    escitalopram  (LEXAPRO ) 10 MG tablet, Take 1 tablet (10 mg total) by mouth daily., Disp: 90 tablet, Rfl: 0   ferrous sulfate  324 MG TBEC, Take 324 mg by mouth daily., Disp: , Rfl:    furosemide  (LASIX ) 20 MG tablet, Take 1 tablet (20 mg total) by mouth daily.,  Disp: 60 tablet, Rfl: 6   losartan  (COZAAR ) 25 MG tablet, Take 0.5 tablets (12.5 mg total) by mouth daily., Disp: 30 tablet, Rfl: 6   naproxen  (NAPROSYN ) 500 MG tablet, Take 1 tablet (500 mg total) by mouth 2 (two) times daily with a meal. (Patient taking differently: Take 500 mg by mouth 2 (two) times daily as needed for mild pain (pain score 1-3) or moderate pain (pain score 4-6).), Disp: 20 tablet, Rfl: 0   NON FORMULARY, Pt uses a c-pap nightly, Disp: , Rfl:    potassium chloride  (KLOR-CON ) 10 MEQ tablet, Take 1 tablet (10 mEq total) by mouth daily. (Patient not taking: Reported on 05/12/2024), Disp: 90 tablet, Rfl: 3   sodium zirconium cyclosilicate  (LOKELMA ) 10 g PACK packet, Take 5 grams twice daily for 2 days (Patient not taking: Reported on 05/12/2024), Disp: 2 packet, Rfl:    Tiotropium Bromide Monohydrate  (SPIRIVA  RESPIMAT) 2.5 MCG/ACT AERS, Inhale 2 puffs into the lungs daily., Disp: 60 each, Rfl: 12   WIXELA INHUB 100-50 MCG/ACT AEPB, INHALE 1 DOSE BY MOUTH TWICE A DAY, Disp: 180 each, Rfl: 1  Vitals   Vitals:   05/21/24 0811 05/21/24 0838 05/21/24 0845  BP: (!) 157/99 (!) 139/96   Pulse: 96 88   Resp: 18 19   Temp:   98.5 F (36.9 C)  TempSrc:   Oral  SpO2: 95% (!) 89%     There is no height or weight on file to calculate BMI.   Physical Exam   Constitutional: Appears well-developed and well-nourished.   Neurologic Examination    Neuro: Mental Status: Patient is awake, alert, oriented to person, place, month, year, and situation. Patient is able to give a clear and coherent history. No signs of aphasia or neglect Cranial Nerves: II: Visual Fields are full. Pupils are equal, round, and reactive to light.   III,IV, VI: She has full EOM in her right eye, and her left eye she is unable to lift her eye past midline, she has restricted abduction as well, though she does have some ability to abduct her left eye, she is unable to get it all the way to the side.  She is able to  medially move her left eye relatively well. V: Facial sensation is symmetric to touch VII: She has impaired eyebrow raising and has a slightly asymmetric smile though her family member does indicate that she normally has a crooked smile and it appears normal currently. VIII: hearing is  intact to voice X: Uvula elevates symmetrically XII: tongue is midline without atrophy or fasciculations.  Motor: Tone is normal. Bulk is normal. 5/5 strength was present in all four extremities.  Sensory: Sensation is symmetric to light touch and temperature in the arms and legs Cerebellar: Does not perform     Labs/Imaging/Neurodiagnostic studies   CBC:  Recent Labs  Lab June 04, 2024 0937 05/21/24 0839  WBC  --  3.7*  NEUTROABS  --  2.3  HGB 17.7* 16.1*  HCT 52.0* 50.3*  MCV  --  80.5  PLT  --  228   Basic Metabolic Panel:  Lab Results  Component Value Date   NA 139 Jun 04, 2024   K 3.8 04-Jun-2024   CO2 22 05/12/2024   GLUCOSE 85 05/12/2024   BUN 18 05/12/2024   CREATININE 0.98 05/12/2024   CALCIUM 9.3 05/12/2024   GFRNONAA >60 05/12/2024   GFRAA 119 05/31/2021   Lipid Panel:  Lab Results  Component Value Date   LDLCALC 141 (H) 10/22/2023   HgbA1c:  Lab Results  Component Value Date   HGBA1C 5.8 (H) 10/22/2023   CT Head without contrast(Personally reviewed): negative  CT angio Head and Neck with contrast(Personally reviewed): negative  MRI Brain(Personally reviewed): Right thalamic stroke  ASSESSMENT   Murray MAGEN SURIANO is a 43 y.o. female with a history of hyperlipidemia, obesity, previous smoker, heart failure and severe pulmonary hypertension who presents with right thalamic stroke.  Her symptoms are difficult to tie to the thalamic lesion, and so my suspicion is that there is some degree of brainstem ischemia as well given that they share vascular supply.  Given the abrupt onset, painless nature, and lack of pathology in the cavernous sinus on MRI, my suspicion is her  symptoms are related to acute ischemia.  I would favor admission for secondary stroke workup.  She had a catheterization 2 days ago, and therefore this could be related, but this could also be small vessel disease.  RECOMMENDATIONS  - HgbA1c, fasting lipid panel - Frequent neuro checks - Echocardiogram - Prophylactic therapy-Antiplatelet med: Aspirin - dose 81mg  and plavix 75mg  daily  after 300mg  load  - Risk factor modification - Telemetry monitoring - PT consult, OT consult, Speech consult  ______________________________________________________________________    Signed, Ann Keto, MD [facility]

## 2024-05-21 NOTE — ED Notes (Signed)
 Delay in obtaining blood work due to patient having poor venous access.

## 2024-05-21 NOTE — Progress Notes (Signed)
   05/21/24 0845  Spiritual Encounters  Type of Visit Initial  Care provided to: Pt and family  Conversation partners present during encounter Nurse  Reason for visit Code  OnCall Visit Yes   Chaplain visited patient because of a Code STROKE.  Patient was alert and immediately asked for prayer.  Chaplain prayed with patient and patient's fiance.  Patient and fiance shared the health concerns that the patient has been dealing with since the start of the year.  Chaplain offered a compassionate presence and reflective listening.  Chaplain provided nourishment for patient's fiance.  Patient shared she'd like to complete an AD packet that she brought with her from [facility].  Chaplain reviewed the document, explained it and verified all parts were filled out.  Chaplain found Notary in the ED and got to volunteers as witnesses.  Chaplain provided copies and scanned in the document to be uploaded to patient's file.  Chaplain reminded patient and fiance that Chaplains are available if they need any additional spiritual care.    Rev. Rana M. Nolon Baxter, M.Div. Chaplain [facility]

## 2024-05-21 NOTE — Progress Notes (Signed)
 Code stroke cart activated at 0816-pt in CT.  Teleneurology paged at [phone].  Dr. Alecia Ames in CT at [facility] too assess pt.   Virginia Camacho, Multimedia programmer

## 2024-05-21 NOTE — ED Provider Notes (Signed)
 [facility] Provider Note    Event Date/Time   First MD Initiated Contact with Patient 05/21/24 0813     (approximate)   History   Eye Problem  HPI  Virginia Camacho is a 43 y.o. female who presents with visual changes.  Patient notes she felt well last night, mildly short of breath, recent heart catheterization, does have a history of COPD.  Woke up around 4 AM to go to the bathroom, felt well no issues, woke up at 6:30 AM and noted difficulty focusing her eyes and felt very unsteady while walking     Physical Exam   Triage Vital Signs: ED Triage Vitals [05/21/24 0811]  Encounter Vitals Group     BP (!) 157/99     Girls Systolic BP Percentile      Girls Diastolic BP Percentile      Boys Systolic BP Percentile      Boys Diastolic BP Percentile      Pulse Rate 96     Resp 18     Temp      Temp src      SpO2 95 %     Weight      Height      Head Circumference      Peak Flow      Pain Score      Pain Loc      Pain Education      Exclude from Growth Chart     Most recent vital signs: Vitals:   05/21/24 1100 05/21/24 1300  BP: (!) 133/93 (!) 144/99  Pulse: 81 81  Resp: (!) 21 (!) 21  Temp:    SpO2: 92% 93%     General: Awake, no distress.  CV:  Good peripheral perfusion.  Resp:  Normal effort.  Abd:  No distention.  Other:  Neuro: Patient has left superior rectus deficit, her left eye is unable to go above midline.  She also appears to have forehead droop on the left likely consistent with cranial nerve III deficit   ED Results / Procedures / Treatments   Labs (all labs ordered are listed, but only abnormal results are displayed) Labs Reviewed  PROTIME-INR - Abnormal; Notable for the following components:      Result Value   Prothrombin Time 15.4 (*)    All other components within normal limits  CBC - Abnormal; Notable for the following components:   WBC 3.7 (*)    RBC 6.25 (*)    Hemoglobin 16.1 (*)    HCT 50.3 (*)     MCH 25.8 (*)    RDW 20.7 (*)    All other components within normal limits  COMPREHENSIVE METABOLIC PANEL WITH GFR - Abnormal; Notable for the following components:   Sodium 132 (*)    CO2 18 (*)    Calcium 8.5 (*)    Total Protein 5.8 (*)    Albumin 3.3 (*)    Alkaline Phosphatase 21 (*)    Total Bilirubin 1.5 (*)    All other components within normal limits  ETHANOL  APTT  DIFFERENTIAL  URINE DRUG SCREEN, QUALITATIVE ([facility] ONLY)  HEMOGLOBIN A1C  POC URINE PREG, ED     EKG  ED ECG REPORT I, Bryson Carbine, the attending physician, personally viewed and interpreted this ECG.  Date: 05/21/2024  Rhythm: normal sinus rhythm QRS Axis: normal Intervals: normal ST/T Wave abnormalities: normal Narrative Interpretation: no evidence of acute ischemia    RADIOLOGY CT  head, MRI brain    PROCEDURES:  Critical Care performed: yes  CRITICAL CARE Performed by: Bryson Carbine   Total critical care time: 30 minutes  Critical care time was exclusive of separately billable procedures and treating other patients.  Critical care was necessary to treat or prevent imminent or life-threatening deterioration.  Critical care was time spent personally by me on the following activities: development of treatment plan with patient and/or surrogate as well as nursing, discussions with consultants, evaluation of patient's response to treatment, examination of patient, obtaining history from patient or surrogate, ordering and performing treatments and interventions, ordering and review of laboratory studies, ordering and review of radiographic studies, pulse oximetry and re-evaluation of patient's condition.   Procedures   MEDICATIONS ORDERED IN ED: Medications  aspirin EC tablet 81 mg (81 mg Oral Given 05/21/24 1353)  clopidogrel (PLAVIX) tablet 300 mg (300 mg Oral Given 05/21/24 1353)    And  clopidogrel (PLAVIX) tablet 75 mg (has no administration in time range)  bisoprolol  (ZEBETA )  tablet 2.5 mg (has no administration in time range)  escitalopram  (LEXAPRO ) tablet 10 mg (has no administration in time range)  albuterol  (PROVENTIL ) (2.5 MG/3ML) 0.083% nebulizer solution 3 mL (has no administration in time range)  Tiotropium Bromide Monohydrate  AERS 2 puff (has no administration in time range)  fluticasone  furoate-vilanterol (BREO ELLIPTA ) 100-25 MCG/ACT 1 puff (has no administration in time range)   stroke: early stages of recovery book (has no administration in time range)  acetaminophen  (TYLENOL ) tablet 650 mg (has no administration in time range)    Or  acetaminophen  (TYLENOL ) 160 MG/5ML solution 650 mg (has no administration in time range)    Or  acetaminophen  (TYLENOL ) suppository 650 mg (has no administration in time range)  senna-docusate (Senokot-S) tablet 1 tablet (has no administration in time range)  sodium chloride  flush (NS) 0.9 % injection 3-10 mL (has no administration in time range)  sodium chloride  flush (NS) 0.9 % injection 3-10 mL (has no administration in time range)  enoxaparin  (LOVENOX ) injection 50 mg (has no administration in time range)  hydrALAZINE  (APRESOLINE ) injection 5 mg (has no administration in time range)  iohexol  (OMNIPAQUE ) 350 MG/ML injection 75 mL (75 mLs Intravenous Contrast Given 05/21/24 0830)  gadobutrol (GADAVIST) 1 MMOL/ML injection 10 mL (10 mLs Intravenous Contrast Given 05/21/24 1235)     IMPRESSION / MDM / ASSESSMENT AND PLAN / ED COURSE  I reviewed the triage vital signs and the nursing notes. Patient's presentation is most consistent with acute presentation with potential threat to life or bodily function.  Patient presents with findings concerning for cranial nerve III superior branch deficit within tPA window.  Code stroke activated  Patient seen by Dr. Alecia Ames of neurology, he has decided against TNK.  Is recommending MRI and MRI orbits  MRI is consistent with thalamic CVA, had admitted to the hospitalist team       FINAL CLINICAL IMPRESSION(S) / ED DIAGNOSES   Final diagnoses:  Cerebrovascular accident (CVA), unspecified mechanism (HCC)     Rx / DC Orders   ED Discharge Orders     None        Note:  This document was prepared using Dragon voice recognition software and may include unintentional dictation errors.   Bryson Carbine, MD 05/21/24 [phone]

## 2024-05-21 NOTE — ED Notes (Signed)
 [facility] called for codestroke per RN Lovett Ruck , spoke with [facility]

## 2024-05-21 NOTE — ED Notes (Signed)
 Patient placed on 2L NC.

## 2024-05-21 NOTE — ED Triage Notes (Signed)
 Patient states she went to bed last night, woke up at 0400 and was normal. Went back to bed and woke up at 0600 but had difficulty with her vision. States she felt wobbly and couldn't focus. MD Kinner at bedside.    BP 173/107

## 2024-05-21 NOTE — ED Notes (Signed)
 8119 Code stroke called by Dr. Martina Sledge 0815 Arrived in CT [phone] Telestroke notified 0818 Dr. Alecia Ames to CT [phone] Per Dr. Alecia Ames, no TNK.

## 2024-05-22 ENCOUNTER — Other Ambulatory Visit: Payer: Self-pay | Admitting: Nurse Practitioner

## 2024-05-22 ENCOUNTER — Observation Stay

## 2024-05-22 ENCOUNTER — Ambulatory Visit: Attending: Nurse Practitioner

## 2024-05-22 DIAGNOSIS — I639 Cerebral infarction, unspecified: Secondary | ICD-10-CM | POA: Diagnosis not present

## 2024-05-22 DIAGNOSIS — I272 Pulmonary hypertension, unspecified: Secondary | ICD-10-CM | POA: Diagnosis not present

## 2024-05-22 DIAGNOSIS — I739 Peripheral vascular disease, unspecified: Secondary | ICD-10-CM | POA: Diagnosis not present

## 2024-05-22 DIAGNOSIS — R29702 NIHSS score 2: Secondary | ICD-10-CM | POA: Diagnosis not present

## 2024-05-22 LAB — LIPID PANEL
Cholesterol: 162 mg/dL (ref 0–200)
HDL: 36 mg/dL — ABNORMAL LOW (ref >40)
LDL Cholesterol: 113 mg/dL — ABNORMAL HIGH (ref 0–99)
Total CHOL/HDL Ratio: 4.5 ratio
Triglycerides: 67 mg/dL (ref <150)
VLDL: 13 mg/dL (ref 0–40)

## 2024-05-22 LAB — BRAIN NATRIURETIC PEPTIDE: B Natriuretic Peptide: 208.7 pg/mL — ABNORMAL HIGH (ref 0.0–100.0)

## 2024-05-22 LAB — D-DIMER, QUANTITATIVE: D-Dimer, Quant: 0.81 ug{FEU}/mL — ABNORMAL HIGH (ref 0.00–0.50)

## 2024-05-22 NOTE — Plan of Care (Signed)
 Pt continues to desat into the 80s when up and ambulating with O2 at 2L. MD made aware.

## 2024-05-22 NOTE — Evaluation (Signed)
 Occupational Therapy Evaluation Patient Details Name: Virginia Camacho MRN: [id number] DOB: 03-Jul-1981 Today's Date: 05/22/2024   History of Present Illness   Pt admitted to [facility] on 05/21/24 under observation for c/o stroke like symptoms including: vision changes and imbalance. TNK not indicated at this time. Recent right heart cath for investigation of pulmonary HTN. MRI showed no acute 18 mm acute infarct within the right thalamus and small chronic bilateral cerebellar infarct. Significant PMH includes: HLD, obesity, previous smoker, HFpEF, COPD, OSA on CPAP, severe pulmonary hypertension     Clinical Impressions Pt seen for OT evaluation this date.  Pt lives in an apartment with her significant other, has 19 steps to get into the apartment.  She is able to demonstrate the ability to perform her basic self care tasks with modified independence.  Recommend shower chair for safety with shower transfers.  Educated on safety with transfers and mobility, her O2 sats decrease into the 80s with activity this date. She is able to perform breathing techniques to return to 90%.  Nursing monitoring and continuing to address since she did not have oxygen in the past. Gait is slower but able to ambulate to and from the bathroom without AD.  Pt denies any weakness in UEs, unable to fully test left elbow secondary to IV placement but appears within functional limits.  Pt does not require further skilled OT services at this time.       If plan is discharge home, recommend the following:         Functional Status Assessment   Patient has had a recent decline in their functional status and demonstrates the ability to make significant improvements in function in a reasonable and predictable amount of time.     Equipment Recommendations   Tub/shower bench     Recommendations for Other Services         Precautions/Restrictions   Precautions Precautions: Fall     Mobility Bed  Mobility Overal bed mobility: Independent                  Transfers Overall transfer level: Modified independent                        Balance Overall balance assessment: Needs assistance   Sitting balance-Leahy Scale: Normal     Standing balance support: No upper extremity supported, During functional activity Standing balance-Leahy Scale: Good Standing balance comment: mild antalgic gait, slowed cadence                           ADL either performed or assessed with clinical judgement   ADL Overall ADL's : Modified independent                                       General ADL Comments: Pt able to demonstrate basic self care tasks with modified independence.  Her O2 sats drop in 80s when moving, O2 in place and able to bring levels up to 90% with breathing techniques. Pt did not have oxygen at home prior to admission.     Vision Baseline Vision/History: 1 Wears glasses Additional Comments: wears glasses for reading, denies any blurred vision or double vision this date     Perception         Praxis         Pertinent  Vitals/Pain Pain Assessment Pain Assessment: No/denies pain     Extremity/Trunk Assessment Upper Extremity Assessment Upper Extremity Assessment: Overall WFL for tasks assessed   Lower Extremity Assessment Lower Extremity Assessment: Defer to PT evaluation   Cervical / Trunk Assessment Cervical / Trunk Assessment: Normal   Communication Communication Communication: No apparent difficulties   Cognition Arousal: Alert Behavior During Therapy: WFL for tasks assessed/performed                                 Following commands: Intact       Cueing  General Comments      Vestibular screen performed and negative. Pt endorses improved vision since admission. Walk test performed: 87-88% on RA, unable to recover to 94% per precaution. Required 2L O2 via [place] for SpO2 >/= 94%. Desaturation  to 83% after ambulation and stair navigation, slowly recovers but requires increased titration of 4L over for recovery to 94%. Able to wean back down to 2L on wall oxygen after a few minutes with SpO2 reading 93% upon exit. Cues for pursed lip breathing throughout walk test, and education for pacing.   Exercises     Shoulder Instructions      Home Living Family/patient expects to be discharged to:: Private residence Living Arrangements: Spouse/significant other Available Help at Discharge: Family;Friend(s);Available PRN/intermittently Type of Home: Apartment Home Access: Stairs to enter Entrance Stairs-Number of Steps: 19 Entrance Stairs-Rails: Can reach both Home Layout: One level     Bathroom Shower/Tub: Door;Walk-in Human resources officer: Standard Bathroom Accessibility: Yes   Home Equipment: None          Prior Functioning/Environment Prior Level of Function : Independent/Modified Independent;Driving             Mobility Comments: IND ambulation without AD. No hx falls. ADLs Comments: IND ADL's, IADL's, medication management. Pt was working full time as an Product/process development scientist for food service    OT Problem List: Impaired balance (sitting and/or standing)   OT Treatment/Interventions:        OT Goals(Current goals can be found in the care plan section)   Acute Rehab OT Goals Patient Stated Goal: to return home and be as independent as possible OT Goal Formulation: With patient Time For Goal Achievement: 06/05/24 Potential to Achieve Goals: Good ADL Goals Pt Will Transfer to Toilet: (P) with modified independence   OT Frequency:       Co-evaluation              AM-PAC OT 6 Clicks Daily Activity     Outcome Measure Help from another person eating meals?: None Help from another person taking care of personal grooming?: None Help from another person toileting, which includes using toliet, bedpan, or urinal?: None Help from another person bathing  (including washing, rinsing, drying)?: None Help from another person to put on and taking off regular upper body clothing?: None Help from another person to put on and taking off regular lower body clothing?: None 6 Click Score: 24   End of Session    Activity Tolerance: Patient tolerated treatment well Patient left: in bed;with call bell/phone within reach;with family/visitor present  OT Visit Diagnosis: Unsteadiness on feet (R26.81)                Time: 8295-6213 OT Time Calculation (min): 38 min Charges:  OT General Charges $OT Visit: 1 Visit OT Evaluation $OT Eval Moderate Complexity: 1 Mod  OT Treatments $Self Care/Home Management : 8-22 mins  Jalani Cullifer T Carnita Golob, OTR/L, CLT Asako Saliba 05/22/2024, 12:47 PM

## 2024-05-22 NOTE — Evaluation (Signed)
 Physical Therapy Evaluation Patient Details Name: Virginia Camacho MRN: [id number] DOB: May 16, 1981 Today's Date: 05/22/2024  History of Present Illness  Pt admitted to [facility] on 05/21/24 under observation for c/o stroke like symptoms including: vision changes and imbalance. TNK not indicated at this time. Recent right heart cath for investigation of pulmonary HTN. MRI showed no acute 18 mm acute infarct within the right thalamus and small chronic bilateral cerebellar infarct. Significant PMH includes: HLD, obesity, previous smoker, HFpEF, COPD, OSA on CPAP, severe pulmonary hypertension   Clinical Impression  Pt is a 43 year old F admitted to hospital on 6/13 for ischemic stroke. At baseline, pt is completely IND with ADL's, IADL's, ambulation without AD, and driving.   Pt presents with increased O2 dependence from baseline, mild standing dynamic balance deficit, and decreased activity tolerance resulting in impaired functional mobility from baseline. Pt was grossly mod I for transfers and provided supervision for safety with ambulation and stair navigation. Increased standing rest breaks required during functional mobility secondary to DOE requiring supplemental O2.   Walk test performed: At rest, SpO2 reading 87-88% on RA, unable to recover to 94% per precaution by MD. Required 2L O2 via [place] for SpO2 >/= 94%. Desaturation to 83% after ambulation and stair navigation, slowly recovers but requires increased titration of 4L over for recovery to 94%. Able to wean back down to 2L on wall oxygen after a few minutes with SpO2 reading 93% upon exit.   Deficits limit the pt's ability to safely and independently perform ADL's, transfer, and ambulate. Pt will benefit from acute skilled PT services to address deficits for return to baseline function. Pt will benefit from post acute therapy services to address deficits for return to baseline function. Pt/fiance agreeable.         If plan is discharge home,  recommend the following: Assistance with cooking/housework;Help with stairs or ramp for entrance   Can travel by private vehicle    Yes    Equipment Recommendations BSC/3in1 (home O2)  Recommendations for Other Services       Functional Status Assessment Patient has had a recent decline in their functional status and demonstrates the ability to make significant improvements in function in a reasonable and predictable amount of time.     Precautions / Restrictions Precautions Precautions: Fall Restrictions Other Position/Activity Restrictions: SpO2 >/= 94%      Mobility  Bed Mobility               General bed mobility comments: seated EOB upon entry; has been IND with mobility in room    Transfers Overall transfer level: Modified independent                 General transfer comment: mod I to perform STS transfers at EOB with UE support, demonstrates good eccentric lowering with proper hand placement    Ambulation/Gait Ambulation/Gait assistance: Supervision Gait Distance (Feet): 60 Feet (12ft x2 (room<>stairwell)) Assistive device: None         General Gait Details: Supervision for safety to ambulate to/from stairwell without AD; demonstrates mild antalgic gait on R side due to recent hx of knee sprain, reciprocal gait pattern, decreased LUE arm swing.  Stairs Stairs: Yes Stairs assistance: Supervision Stair Management: Two rails Number of Stairs: 12 General stair comments: Supervision for safety and O2 management to ambulate flight of stairs, ascending with reciprocal gait pattern and BUE support, descending with step to gait pattern and UUE support. x3 standing rest breaks due  to labored breathing; cues for pursed lip breathing.      Balance Overall balance assessment: Needs assistance   Sitting balance-Leahy Scale: Normal     Standing balance support: No upper extremity supported, During functional activity Standing balance-Leahy Scale:  Good Standing balance comment: mild antalgic gait, slowed cadence                             Pertinent Vitals/Pain Pain Assessment Pain Assessment: No/denies pain    Home Living Family/patient expects to be discharged to:: Private residence Living Arrangements: Spouse/significant other Available Help at Discharge: Family;Friend(s);Available PRN/intermittently (fiance works; friends available PRN) Type of Home: Apartment (2nd floor) Home Access: Stairs to enter Entrance Stairs-Rails: Can reach both Entrance Stairs-Number of Steps: 19   Home Layout: One level Home Equipment: None      Prior Function Prior Level of Function : Independent/Modified Independent;Driving             Mobility Comments: IND ambulation without AD. No hx falls. ADLs Comments: IND ADL's, IADL's, medication management.     Extremity/Trunk Assessment   Upper Extremity Assessment Upper Extremity Assessment: Overall WFL for tasks assessed (grossly 4+/5, sensation intact, bilateral finger opposition intact)    Lower Extremity Assessment Lower Extremity Assessment: Overall WFL for tasks assessed (grossly 4+/5, sensation intact, alternating toe tap coordination intact)    Cervical / Trunk Assessment Cervical / Trunk Assessment: Normal  Communication   Communication Communication: No apparent difficulties    Cognition Arousal: Alert Behavior During Therapy: WFL for tasks assessed/performed   PT - Cognitive impairments: No apparent impairments                         Following commands: Intact       Cueing Cueing Techniques: Verbal cues     General Comments General comments (skin integrity, edema, etc.): Vestibular screen performed and negative. Pt endorses improved vision since admission. Walk test performed: 87-88% on RA, unable to recover to 94% per precaution. Required 2L O2 via [place] for SpO2 >/= 94%. Desaturation to 83% after ambulation and stair navigation, slowly  recovers but requires increased titration of 4L over for recovery to 94%. Able to wean back down to 2L on wall oxygen after a few minutes with SpO2 reading 93% upon exit. Cues for pursed lip breathing throughout walk test, and education for pacing.    Exercises Other Exercises Other Exercises: Pt/fiance educated re: PT role/POC, DC recommendations, safety with functional mobility, stair navigation, activity pacing/energy conservation, walk test results, DME recommendations for safety/energy conservation, maintenance of O2 donned in the hospital and importance for lung health given pulmonary PMH. They verbalized understanding.   Assessment/Plan    PT Assessment Patient needs continued PT services  PT Problem List Decreased activity tolerance;Decreased balance;Decreased mobility;Cardiopulmonary status limiting activity       PT Treatment Interventions DME instruction;Stair training;Functional mobility training;Therapeutic activities;Therapeutic exercise;Balance training;Gait training;Neuromuscular re-education;Patient/family education    PT Goals (Current goals can be found in the Care Plan section)  Acute Rehab PT Goals Patient Stated Goal: go home PT Goal Formulation: With patient/family Time For Goal Achievement: 06/05/24 Potential to Achieve Goals: Good    Frequency Min 1X/week        AM-PAC PT 6 Clicks Mobility  Outcome Measure Help needed turning from your back to your side while in a flat bed without using bedrails?: None Help needed moving from lying on your  back to sitting on the side of a flat bed without using bedrails?: None Help needed moving to and from a bed to a chair (including a wheelchair)?: None Help needed standing up from a chair using your arms (e.g., wheelchair or bedside chair)?: None Help needed to walk in hospital room?: A Little Help needed climbing 3-5 steps with a railing? : A Little 6 Click Score: 22    End of Session Equipment Utilized  During Treatment: Gait belt;Oxygen Activity Tolerance: Patient tolerated treatment well Patient left: in bed;with family/visitor present (no bed alarm warranted as pt is safe for IND ambulation in room; provided O2 extension cord) Nurse Communication: Mobility status (O2) PT Visit Diagnosis: Unsteadiness on feet (R26.81) (decreased cardiopulmonary tolerance to activity)    Time: 1610-9604 PT Time Calculation (min) (ACUTE ONLY): 27 min   Charges:   PT Evaluation $PT Eval Moderate Complexity: 1 Mod PT Treatments $Therapeutic Activity: 8-22 mins PT General Charges $$ ACUTE PT VISIT: 1 Visit         Branda Cain, PT, DPT 9:52 AM,05/22/24 Physical Therapist - [facility]

## 2024-05-22 NOTE — Plan of Care (Signed)
 CCMD continues to call and report O2 sats that drop into the 70's. This happens when she ambulates. Reports good wave form so I believe reading is accurate. MD made aware

## 2024-05-22 NOTE — Progress Notes (Signed)
  Progress Note   Patient: Virginia Camacho XBJ:[id number] DOB: 12/24/1980 DOA: 05/21/2024     0 DOS: the patient was seen and examined on 05/22/2024   Brief hospital course:  Virginia Camacho is a 43 y.o. female with medical history significant of asthma, COPD, OSA on CPAP, morbid obesity, chronic HFpEF, severe pulmonary hypertension, presented with sudden onset of vision problems.. unsteady gait   upon waking on morning of admission 05/21/24. See H&P for full HPI on admission & ED course.  Patient was found to have an acute right thalamic stroke on MRI brain, and was admitted for further evaluation and management as outlined in detail below.    Assessment and Plan:  Acute right thalamus stroke Acute blurry vision - resolved Acute ataxia - resolved Given Plavix load 300 mg on admission. --Neurology consulted --DAPT with ASA 81 mg daily + Plavix 75 mg daily x 3 weeks, then ASA 81 daily monotherapy --Lipitor 40 mg daily --Telemetry while admitted --ZioPatch on discharge. [facility] mailing to pt. --Recent echo, will not repeat at this time --Outpatient Neurology follow up  Hypoxia - on 2 L/min O2 today, no baseline O2 need. Baseline dyspnea on exertion not significantly worse currently. --Continuous pulse ox --Supplement O2 to keep sats > 90% --Check chest x-ray --Check D-dimer and BNP   HTN - Hold off home BP meds - As needed hydralazine  for 200/110   Sleep apnea - Continue CPAP at bedtime   COPD Severe pulmonary hypertension - Outpatient follow-up with cardiology   Morbid obesity - Outpatient GLP-1 evaluation      Subjective: Pt seen with fiance at bedside today. Vision changes resolved and gait back to normal today.  Denies need for O2 at baseline, does have baseline dyspnea on exertion esp noted climbing stairs, takes breaks to catch her breath.    Physical Exam: Vitals:   05/22/24 0345 05/22/24 0747 05/22/24 1147 05/22/24 1545  BP: 132/87 126/88 (!)  125/92 (!) 136/92  Pulse: 73 73 70 78  Resp:    20  Temp: 98.3 F (36.8 C) 98.2 F (36.8 C) (!) 97.5 F (36.4 C) 98 F (36.7 C)  TempSrc:      SpO2: 92% 94% 94% 93%  Weight:      Height:       General exam: awake, alert, no acute distress HEENT: moist mucus membranes, hearing grossly normal  Respiratory system: CTAB, no wheezes, rales or rhonchi, normal respiratory effort at rest on 2 L/min [place] O2. Cardiovascular system: normal S1/S2, RRR, no JVD, murmurs, rubs, gallops,  no pedal edema.   Gastrointestinal system: soft, NT, ND Central nervous system: A&O x 3. no gross focal neurologic deficits, normal speech Extremities: moves all, no edema, normal tone Psychiatry: normal mood, congruent affect, judgement and insight appear normal     Data Reviewed:  Notable labs --  LDL 113 HDL 36   Family Communication: Fiance at bedside  Disposition: Status is: Observation Remains admitted with oxygen requirement under evaluation   Planned Discharge Destination: Home    Time spent: 45 minutes  Author: Montey Apa, DO 05/22/2024 6:44 PM  For on call review [email or web address].

## 2024-05-22 NOTE — Progress Notes (Signed)
 NEUROLOGY CONSULT FOLLOW UP NOTE   Date of service: May 22, 2024 Patient Name: Virginia Camacho MRN:  [id number] DOB:  01-14-1981  Interval Hx/subjective   She feels significantly better  Vitals   Vitals:   05/21/24 2050 05/21/24 2346 05/22/24 0345 05/22/24 0747  BP: 113/84 (!) 136/93 132/87 126/88  Pulse: 82 74 73 73  Resp: 18     Temp: 98 F (36.7 C) 97.8 F (36.6 C) 98.3 F (36.8 C) 98.2 F (36.8 C)  TempSrc: Oral     SpO2: 93% 92% 92% 94%  Weight:      Height:         Body mass index is 38.98 kg/m.  Physical Exam   Constitutional: Appears well-developed and well-nourished.  Neurologic Examination    MS: Awake, alert, interactive and appropriate CN: The extraocular movements in her left eye are significantly better than yesterday, with only mild disconjugate gaze at times, she continues to have left ptosis.  She continues to have very mild left facial weakness and a lower motor neuron distribution, but it is significantly improved from yesterday. Motor: No drift in either upper extremity, good strength to confrontation Sensory: Intact to light touch and temperature  Medications  Current Facility-Administered Medications:    acetaminophen  (TYLENOL ) tablet 650 mg, 650 mg, Oral, Q4H PRN **OR** acetaminophen  (TYLENOL ) 160 MG/5ML solution 650 mg, 650 mg, Per Tube, Q4H PRN **OR** acetaminophen  (TYLENOL ) suppository 650 mg, 650 mg, Rectal, Q4H PRN, Antoniette Batty T, MD   albuterol  (PROVENTIL ) (2.5 MG/3ML) 0.083% nebulizer solution 3 mL, 3 mL, Nebulization, Q6H PRN, Antoniette Batty T, MD   aspirin EC tablet 81 mg, 81 mg, Oral, Daily, Augustin Leber, MD, 81 mg at 05/22/24 0941   bisoprolol  (ZEBETA ) tablet 2.5 mg, 2.5 mg, Oral, Daily, Jeane Miguel, Ping T, MD, 2.5 mg at 05/22/24 8657   [COMPLETED] clopidogrel (PLAVIX) tablet 300 mg, 300 mg, Oral, Once, 300 mg at 05/21/24 1353 **AND** clopidogrel (PLAVIX) tablet 75 mg, 75 mg, Oral, Daily, Augustin Leber, MD, 75 mg at  05/22/24 0941   enoxaparin  (LOVENOX ) injection 50 mg, 50 mg, Subcutaneous, Q24H, Zhang, Ping T, MD, 50 mg at 05/21/24 2225   escitalopram  (LEXAPRO ) tablet 10 mg, 10 mg, Oral, Daily, Antoniette Batty T, MD, 10 mg at 05/22/24 0941   fluticasone  furoate-vilanterol (BREO ELLIPTA ) 100-25 MCG/ACT 1 puff, 1 puff, Inhalation, Daily, Antoniette Batty T, MD, 1 puff at 05/22/24 0945   hydrALAZINE  (APRESOLINE ) injection 5 mg, 5 mg, Intravenous, Q6H PRN, Antoniette Batty T, MD   senna-docusate (Senokot-S) tablet 1 tablet, 1 tablet, Oral, QHS PRN, Antoniette Batty T, MD   sodium chloride  flush (NS) 0.9 % injection 3-10 mL, 3-10 mL, Intravenous, Q12H, Jeane Miguel, Ping T, MD, 10 mL at 05/22/24 0946   sodium chloride  flush (NS) 0.9 % injection 3-10 mL, 3-10 mL, Intravenous, PRN, Antoniette Batty T, MD   umeclidinium bromide (INCRUSE ELLIPTA) 62.5 MCG/ACT 1 puff, 1 puff, Inhalation, Daily, Antoniette Batty T, MD, 1 puff at 05/22/24 0943  Labs and Diagnostic Imaging   LDL 113 A1c 5.6 CTA without significant atherosclerotic disease   Imaging(Personally reviewed): MRI brain-right thalamic infarct, old cerebellar infarcts  Assessment   Virginia Camacho is a 43 y.o. female with a history of heart failure, pulmonary hypertension, former smoker, hyperlipidemia, morbid obesity, atypical chest pain who presented with ophthalmoplegia and was found to have a thalamic stroke.  My suspicion is that there was initially some midbrain involvement, which subsequently cleared given that her ophthalmoplegia has  markedly improved and is not typically associated with thalamic strokes.   Recommendations  Continue aspirin 81 mg and Plavix 75 mg daily for 3 weeks followed by aspirin 81 mg daily Continue high intensity statin with atorvastatin 40 mg daily for LDL 113 Continue cardiac monitoring, would consider prolonged cardiac monitoring at discharge Outpatient neurology  follow-up  ______________________________________________________________________   Signed, Ann Keto, MD [facility]

## 2024-05-22 NOTE — Plan of Care (Signed)
 Problem: Ischemic Stroke/TIA Tissue Perfusion: Goal: Complications of ischemic stroke/TIA will be minimized Outcome: Progressing  Problem: Education: Goal: Knowledge of disease or condition will improve Outcome: Progressing Goal: Knowledge of secondary prevention will improve  Outcome: Progressing Goal: Knowledge of patient specific risk factors will improve  Outcome: Progressing  Problem: Coping: Goal: Will verbalize positive feelings about self Outcome: Progressing Goal: Will identify appropriate support needs Outcome: Progressing   Problem: Health Behavior/Discharge Planning: Goal: Ability to manage health-related needs will improve Outcome: Progressing Goal: Goals will be collaboratively established with patient/family Outcome: Progressing  Problem: Self-Care: Goal: Ability to participate in self-care as condition permits will improve Outcome: Progressing Goal: Verbalization of feelings and concerns over difficulty with self-care will improve Outcome: Progressing Goal: Ability to communicate needs accurately will improve Outcome: Progressing  Problem: Nutrition: Goal: Risk of aspiration will decrease Outcome: Progressing Goal: Dietary intake will improve Outcome: Progressing

## 2024-05-22 NOTE — Progress Notes (Signed)
 SLP Cancellation Note  Patient Details Name: Virginia Camacho MRN: [id number] DOB: September 06, 1981   Cancelled treatment:       Reason Eval/Treat Not Completed: SLP screened, no needs identified, will sign off (chart reviewed; consulted NSG then met w/ pt/Family in room.)  Pt admitted to [facility] on 05/21/24 under observation for c/o stroke like symptoms including: vision changes and imbalance. TNK not indicated at this time. Recent right heart cath for investigation of pulmonary HTN. MRI showed no acute 18 mm acute infarct within the right thalamus and small chronic bilateral cerebellar infarct. Significant PMH includes: HLD, obesity, previous smoker, HFpEF, COPD, OSA on CPAP, severe pulmonary hypertension. At baseline, pt is completely IND with ADL's in the home.  Pt denied any difficulty swallowing and is currently on a regular diet; just completed breakfast meal w/out difficulty noted. Pt tolerates swallowing pills w/ water per NSG. Pt conversed in Full conversation w/out overt expressive/receptive deficits noted; pt denied any speech-language deficits. Speech clear. Mild asymmetry of mouth at baseline -- Chronic per pt/chart notes. No further skilled ST services indicated as pt appears at her Baseline. Pt agreed and asked about Discharge soon(deferred to NSG who arrived to room). MD/NSG to reconsult if any change in status while admitted.      Darla Edward, MS, CCC-SLP Speech Language Pathologist Rehab Services; [facility] [phone] (ascom) Virginia Camacho 05/22/2024, 10:04 AM

## 2024-05-22 NOTE — Plan of Care (Signed)
 Patient walked with no oxygen. Pulse ox reads 90% prior to ambulation. While walking O2 sat dropped to mid 70's. Placed on 3L [place], which brought sat up to 91-92%. Continous pulse ox placed to, as directed. Sat of 92%. Call from tele monitoring to report sat dropped to 86% while up to bathroom.

## 2024-05-22 NOTE — Plan of Care (Signed)

## 2024-05-23 ENCOUNTER — Observation Stay

## 2024-05-23 DIAGNOSIS — I6381 Other cerebral infarction due to occlusion or stenosis of small artery: Secondary | ICD-10-CM

## 2024-05-23 LAB — BASIC METABOLIC PANEL WITH GFR
Anion gap: 7 (ref 5–15)
BUN: 15 mg/dL (ref 6–20)
CO2: 21 mmol/L — ABNORMAL LOW (ref 22–32)
Calcium: 9 mg/dL (ref 8.9–10.3)
Chloride: 109 mmol/L (ref 98–111)
Creatinine, Ser: 0.85 mg/dL (ref 0.44–1.00)
GFR, Estimated: >60 mL/min (ref >60)
Glucose, Bld: 104 mg/dL — ABNORMAL HIGH (ref 70–99)
Potassium: 4.3 mmol/L (ref 3.5–5.1)
Sodium: 137 mmol/L (ref 135–145)

## 2024-05-23 MED ORDER — AMOXICILLIN-POT CLAVULANATE 875-125 MG PO TABS: 1.0000 | ORAL_TABLET | Freq: Two times a day (BID) | ORAL | 0 refills | Status: AC

## 2024-05-23 MED ORDER — ATORVASTATIN CALCIUM 20 MG PO TABS
40.0000 mg | ORAL_TABLET | Freq: Every day | ORAL | Status: DC
  Administered 2024-05-23: 40 mg via ORAL
  Filled 2024-05-23: qty 2

## 2024-05-23 MED ORDER — FUROSEMIDE 20 MG PO TABS
20.0000 mg | ORAL_TABLET | Freq: Every day | ORAL | Status: DC
  Administered 2024-05-23: 20 mg via ORAL
  Filled 2024-05-23: qty 1

## 2024-05-23 MED ORDER — IOHEXOL 350 MG/ML SOLN
75.0000 mL | Freq: Once | INTRAVENOUS | Status: AC | PRN
  Administered 2024-05-23: 75 mL via INTRAVENOUS

## 2024-05-23 MED ORDER — LOSARTAN POTASSIUM 25 MG PO TABS
12.5000 mg | ORAL_TABLET | Freq: Every day | ORAL | Status: DC
  Administered 2024-05-23: 12.5 mg via ORAL
  Filled 2024-05-23: qty 1

## 2024-05-23 MED ORDER — ASPIRIN 81 MG PO TBEC: 81.0000 mg | DELAYED_RELEASE_TABLET | Freq: Every day | ORAL | 2 refills | Status: DC

## 2024-05-23 MED ORDER — CLOPIDOGREL BISULFATE 75 MG PO TABS: 75.0000 mg | ORAL_TABLET | Freq: Every day | ORAL | 2 refills | Status: DC

## 2024-05-23 MED ORDER — ATORVASTATIN CALCIUM 40 MG PO TABS: 40.0000 mg | ORAL_TABLET | Freq: Every day | ORAL | 2 refills | Status: DC

## 2024-05-23 NOTE — Plan of Care (Signed)
  Problem: Education: Goal: Knowledge of General Education information will improve Description: Including pain rating scale, medication(s)/side effects and non-pharmacologic comfort measures 05/23/2024 0357 by Aureliano Blow, RN Outcome: Progressing 05/23/2024 0254 by Aureliano Blow, RN Outcome: Progressing   Problem: Health Behavior/Discharge Planning: Goal: Ability to manage health-related needs will improve 05/23/2024 0357 by Aureliano Blow, RN Outcome: Progressing 05/23/2024 0254 by Aureliano Blow, RN Outcome: Progressing   Problem: Clinical Measurements: Goal: Ability to maintain clinical measurements within normal limits will improve 05/23/2024 0357 by Aureliano Blow, RN Outcome: Progressing 05/23/2024 0254 by Aureliano Blow, RN Outcome: Progressing Goal: Will remain free from infection 05/23/2024 0357 by Aureliano Blow, RN Outcome: Progressing 05/23/2024 0254 by Aureliano Blow, RN Outcome: Progressing Goal: Diagnostic test results will improve 05/23/2024 0357 by Aureliano Blow, RN Outcome: Progressing 05/23/2024 0254 by Aureliano Blow, RN Outcome: Progressing Goal: Respiratory complications will improve 05/23/2024 0357 by Aureliano Blow, RN Outcome: Progressing 05/23/2024 0254 by Aureliano Blow, RN Outcome: Progressing Goal: Cardiovascular complication will be avoided 05/23/2024 0357 by Aureliano Blow, RN Outcome: Progressing 05/23/2024 0254 by Aureliano Blow, RN Outcome: Progressing   Problem: Activity: Goal: Risk for activity intolerance will decrease 05/23/2024 0357 by Aureliano Blow, RN Outcome: Progressing 05/23/2024 0254 by Aureliano Blow, RN Outcome: Progressing   Problem: Nutrition: Goal: Adequate nutrition will be maintained 05/23/2024 0357 by Aureliano Blow, RN Outcome: Progressing 05/23/2024 0254 by Aureliano Blow, RN Outcome: Progressing   Problem: Coping: Goal: Level of anxiety will decrease 05/23/2024 0357 by Aureliano Blow, RN Outcome: Progressing 05/23/2024 0254 by Aureliano Blow, RN Outcome: Progressing   Problem:  Elimination: Goal: Will not experience complications related to bowel motility 05/23/2024 0357 by Aureliano Blow, RN Outcome: Progressing 05/23/2024 0254 by Aureliano Blow, RN Outcome: Progressing Goal: Will not experience complications related to urinary retention 05/23/2024 0357 by Aureliano Blow, RN Outcome: Progressing 05/23/2024 0254 by Aureliano Blow, RN Outcome: Progressing   Problem: Pain Managment: Goal: General experience of comfort will improve and/or be controlled 05/23/2024 0357 by Aureliano Blow, RN Outcome: Progressing 05/23/2024 0254 by Aureliano Blow, RN Outcome: Progressing   Problem: Safety: Goal: Ability to remain free from injury will improve 05/23/2024 0357 by Aureliano Blow, RN Outcome: Progressing 05/23/2024 0254 by Aureliano Blow, RN Outcome: Progressing   Problem: Skin Integrity: Goal: Risk for impaired skin integrity will decrease 05/23/2024 0357 by Aureliano Blow, RN Outcome: Progressing 05/23/2024 0254 by Aureliano Blow, RN Outcome: Progressing

## 2024-05-23 NOTE — TOC Progression Note (Signed)
 Transition of Care [facility]) - Progression Note    Patient Details  Name: Virginia Camacho MRN: [id number] Date of Birth: Oct 23, 1981  [facility] CM/SW Contact  Marino Sias, RN Phone Number: 05/23/2024, 9:53 AM  Clinical Narrative: [employer], [employer] called to deliver Oxygen for home use/ discharge today. Ava requests that Provider sign saturation note to meet [employer] criteria. Provider and Nurse notified.           Expected Discharge Plan and Services                                               Social Determinants of Health (SDOH) Interventions SDOH Screenings   Food Insecurity: No Food Insecurity (05/21/2024)  Housing: Low Risk  (05/21/2024)  Transportation Needs: No Transportation Needs (05/21/2024)  Utilities: Not At Risk (05/21/2024)  Alcohol Screen: Low Risk  (10/22/2023)  Depression (PHQ2-9): Low Risk  (04/21/2024)  Financial Resource Strain: Low Risk  (05/31/2021)  Physical Activity: Inactive (05/31/2021)  Social Connections: Moderately Isolated (05/21/2024)  Stress: No Stress Concern Present (05/31/2021)  Tobacco Use: Medium Risk (05/21/2024)    Readmission Risk Interventions     No data to display

## 2024-05-23 NOTE — Progress Notes (Addendum)
 SATURATION QUALIFICATIONS:  Patient Saturations on Room Air at Rest = 90%  Patient Saturations on Room Air while Ambulating = 87%  Patient Saturations on 2 Liters of oxygen while Ambulating = 92%    Attending signed Dr. Darus Engels, DO

## 2024-05-23 NOTE — Plan of Care (Signed)

## 2024-05-23 NOTE — Discharge Summary (Signed)
 Physician Discharge Summary   Patient: Virginia Camacho MRN: [id number] DOB: 10/08/1981  Admit date:     05/21/2024  Discharge date: {dischdate:26783}  Discharge Physician: Montey Apa   PCP: Quinton Buckler, FNP   Recommendations at discharge:  {Tip this will not be part of the note when signed- Example include specific recommendations for outpatient follow-up, pending tests to follow-up on. (Optional):26781}  ***  Discharge Diagnoses: Principal Problem:   Right thalamic stroke (HCC) Active Problems:   (HFpEF) heart failure with preserved ejection fraction (HCC)   Hyperlipidemia   Asthma   COPD (chronic obstructive pulmonary disease) (HCC)   Pulmonary hypertension, unspecified (HCC)   Stroke (cerebrum) (HCC)  Resolved Problems:   * No resolved hospital problems. [facility] Med Course: No notes on file  Assessment and Plan: No notes have been filed under this hospital service. Service: Hospitalist     {Tip this will not be part of the note when signed Body mass index is 38.98 kg/m. , ,  (Optional):26781}  {(NOTE) Pain control PDMP Statment (Optional):26782} Consultants: *** Procedures performed: ***  Disposition: {Plan; Disposition:26390} Diet recommendation:  {Diet_Plan:26776} DISCHARGE MEDICATION: Allergies as of 05/23/2024       Reactions   Clarithromycin Hives, Nausea Only   Hot   Oxycodone Hives     Med Rec must be completed prior to using this [facility]***       Follow-up Information     Quinton Buckler, FNP Follow up.   Specialty: Nurse Practitioner Why: hospital follow up Contact information: [address] [place] [address] [phone]                Discharge Exam: [name] Weights   05/21/24 1728  Weight: 103 kg   ***  Condition at discharge: {DC Condition:26389}  The results of significant diagnostics from this hospitalization (including imaging, microbiology, ancillary and laboratory) are listed  below for reference.   Imaging Studies: DG Chest Port 1 View Result Date: 05/22/2024 CLINICAL DATA:  Hypoxia. EXAM: PORTABLE CHEST 1 VIEW COMPARISON:  03/11/2024. FINDINGS: The heart size and mediastinal contours are stable. Emphysematous changes are noted in the lungs. Interstitial prominence is noted bilaterally. Mild airspace disease is present at the right lung base. No consolidation, effusion, or pneumothorax is seen. No acute osseous abnormality. IMPRESSION: 1. Mild infiltrate at the right lung base. 2. Emphysema and coarse interstitial markings. Electronically Signed   By: Wyvonnia Heimlich M.D.   On: 05/22/2024 16:12   MR BRAIN W WO CONTRAST Result Date: 05/21/2024 CLINICAL DATA:  Provided history: Diplopia. EXAM: MRI HEAD AND ORBITS WITHOUT AND WITH CONTRAST TECHNIQUE: Multiplanar, multiecho pulse sequences of the brain and surrounding structures were obtained without and with intravenous contrast. Multiplanar, multiecho pulse sequences of the orbits and surrounding structures were obtained including fat saturation techniques, before and after intravenous contrast administration. CONTRAST:  10mL GADAVIST GADOBUTROL 1 MMOL/ML IV SOLN COMPARISON:  Non-contrast head CT and CT angiogram head/neck performed earlier today 05/21/2024. FINDINGS: MRI HEAD FINDINGS Brain: No age-advanced or lobar predominant cerebral atrophy. 18 mm acute infarct within the right thalamus (for instance as seen on series 5, images 24-28). Small chronic infarcts within the bilateral cerebellar hemispheres. Partial empty sella turcica. No cortical encephalomalacia is identified. No significant cerebral white matter disease. No evidence of an intracranial mass. No chronic intracranial blood products. No extra-axial fluid collection. No midline shift. No pathologic intracranial enhancement identified. Vascular: Maintained flow voids within the proximal large arterial vessels. Skull and  upper cervical spine: No focal worrisome marrow  lesion. MRI ORBITS FINDINGS Orbits: The globes are normal in size and contour. The extraocular muscles, optic nerve sheath complexes and lacrimal glands are symmetric and unremarkable. No orbital mass or pathologic orbital enhancement. Visualized sinuses: No significant paranasal sinus disease. Soft tissues: The visible maxillofacial and upper neck soft tissues are unremarkable. IMPRESSION: MRI brain: 1. 18 mm acute infarct within the right thalamus. 2. 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 3. Partially empty sella turcica. This finding can reflect incidental anatomic variation, or alternatively, it can be associated with chronic idiopathic intracranial hypertension (pseudotumor cerebri). MRI orbits: Unremarkable MRI appearance of the orbits. Electronically Signed   By: Bascom Lily D.O.   On: 05/21/2024 13:15   MR ORBITS W WO CONTRAST Result Date: 05/21/2024 CLINICAL DATA:  Provided history: Diplopia. EXAM: MRI HEAD AND ORBITS WITHOUT AND WITH CONTRAST TECHNIQUE: Multiplanar, multiecho pulse sequences of the brain and surrounding structures were obtained without and with intravenous contrast. Multiplanar, multiecho pulse sequences of the orbits and surrounding structures were obtained including fat saturation techniques, before and after intravenous contrast administration. CONTRAST:  10mL GADAVIST GADOBUTROL 1 MMOL/ML IV SOLN COMPARISON:  Non-contrast head CT and CT angiogram head/neck performed earlier today 05/21/2024. FINDINGS: MRI HEAD FINDINGS Brain: No age-advanced or lobar predominant cerebral atrophy. 18 mm acute infarct within the right thalamus (for instance as seen on series 5, images 24-28). Small chronic infarcts within the bilateral cerebellar hemispheres. Partial empty sella turcica. No cortical encephalomalacia is identified. No significant cerebral white matter disease. No evidence of an intracranial mass. No chronic intracranial blood products. No extra-axial fluid  collection. No midline shift. No pathologic intracranial enhancement identified. Vascular: Maintained flow voids within the proximal large arterial vessels. Skull and upper cervical spine: No focal worrisome marrow lesion. MRI ORBITS FINDINGS Orbits: The globes are normal in size and contour. The extraocular muscles, optic nerve sheath complexes and lacrimal glands are symmetric and unremarkable. No orbital mass or pathologic orbital enhancement. Visualized sinuses: No significant paranasal sinus disease. Soft tissues: The visible maxillofacial and upper neck soft tissues are unremarkable. IMPRESSION: MRI brain: 1. 18 mm acute infarct within the right thalamus. 2. 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 3. Partially empty sella turcica. This finding can reflect incidental anatomic variation, or alternatively, it can be associated with chronic idiopathic intracranial hypertension (pseudotumor cerebri). MRI orbits: Unremarkable MRI appearance of the orbits. Electronically Signed   By: Bascom Lily D.O.   On: 05/21/2024 13:15   CT ANGIO HEAD NECK W WO CM (CODE STROKE) Result Date: 05/21/2024 CLINICAL DATA:  Neuro deficit, concern for stroke, partial left facial paralysis. EXAM: CT ANGIOGRAPHY HEAD AND NECK WITH AND WITHOUT CONTRAST TECHNIQUE: Multidetector CT imaging of the head and neck was performed using the standard protocol during bolus administration of intravenous contrast. Multiplanar CT image reconstructions and MIPs were obtained to evaluate the vascular anatomy. Carotid stenosis measurements (when applicable) are obtained utilizing NASCET criteria, using the distal internal carotid diameter as the denominator. RADIATION DOSE REDUCTION: This exam was performed according to the departmental dose-optimization program which includes automated exposure control, adjustment of the mA and/or kV according to patient size and/or use of iterative reconstruction technique. CONTRAST:  75mL  OMNIPAQUE  IOHEXOL  350 MG/ML SOLN COMPARISON:  Same-day head CT. FINDINGS: CTA NECK FINDINGS Aortic arch: 3 vessel configuration of the aortic arch with common origin of the brachiocephalic and left common carotid arteries and direct origin of the left vertebral artery on  the aortic arch. Imaged portion of the aortic arch without aneurysmal dilatation or significant stenosis. No stenosis at the origins of the aortic arch vessels. Pulmonary arteries: As permitted by contrast timing, there are no filling defects in the visualized pulmonary arteries. Subclavian arteries: The subclavian arteries are patent bilaterally. Right carotid system: No evidence of dissection, stenosis (50% or greater), or occlusion. Mild tortuosity of the cervical ICA without stenosis. Left carotid system: No evidence of dissection, stenosis (50% or greater), or occlusion. Mild tortuosity of the cervical ICA without stenosis. Vertebral arteries: Codominant. No evidence of dissection, stenosis (50% or greater), or occlusion. Skeleton: No acute or aggressive finding noted. Other neck: The visualized airway is patent. No cervical lymphadenopathy. Upper chest: Significant centrilobular emphysema in the visualized lungs. Review of the MIP images confirms the above findings CTA HEAD FINDINGS ANTERIOR CIRCULATION: The intracranial ICAs are patent bilaterally. No significant stenosis, proximal occlusion, aneurysm, or vascular malformation. MCAs: The middle cerebral arteries are patent bilaterally. ACAs: The anterior cerebral arteries are patent bilaterally. POSTERIOR CIRCULATION: No significant stenosis, proximal occlusion, aneurysm, or vascular malformation. PCAs: The posterior cerebral arteries are patent bilaterally. Pcomm: The posterior communicating arteries are visualized bilaterally. SCAs: The superior cerebellar arteries are patent bilaterally. Basilar artery: Patent AICAs: Patent PICAs: Patent Vertebral arteries: The intracranial vertebral  arteries are patent. Venous sinuses: As permitted by contrast timing, patent. Anatomic variants: None Review of the MIP images confirms the above findings IMPRESSION: No large vessel occlusion. No high-grade stenosis, aneurysm, or dissection of the arteries in the head and neck. Emphysema (ICD10-J43.9). These results were communicated to Dr. Alecia Ames at 8:48 am on 05/21/2024 by text page via the [facility] messaging system. Electronically Signed   By: Denny Flack M.D.   On: 05/21/2024 08:48   CT HEAD CODE STROKE WO CONTRAST Result Date: 05/21/2024 CLINICAL DATA:  Code stroke. Vision disturbances and partial facial paralysis. EXAM: CT HEAD WITHOUT CONTRAST TECHNIQUE: Contiguous axial images were obtained from the base of the skull through the vertex without intravenous contrast. RADIATION DOSE REDUCTION: This exam was performed according to the departmental dose-optimization program which includes automated exposure control, adjustment of the mA and/or kV according to patient size and/or use of iterative reconstruction technique. COMPARISON:  CT of the head dated September 28, 2015. FINDINGS: Brain: The brain appears normal. There is no evidence of hemorrhage, mass, cortical infarct or hydrocephalus. Vascular: Unremarkable. Skull: Intact and unremarkable. Sinuses/Orbits: The visualized paranasal sinuses are clear. The orbits are unremarkable. Other: None. ASPECTS [facility] Stroke Program Early CT Score) - Ganglionic level infarction (caudate, lentiform nuclei, internal capsule, insula, M1-M3 cortex): 7 - Supraganglionic infarction (M4-M6 cortex): 3 Total score (0-10 with 10 being normal): 10 IMPRESSION: 1. Normal brain. 2. ASPECTS is 10. 3. These results were called by telephone at the time of interpretation on 05/21/2024 at 8:31 am to provider Dr. Alecia Ames, who verbally acknowledged these results. Electronically Signed   By: Maribeth Shivers M.D.   On: 05/21/2024 08:34   CARDIAC CATHETERIZATION Result Date:  05/18/2024 Conclusions: Severe pulmonary hypertension (mean PA 52 mmHg, PVR 10 WU). Normal left heart filling pressure (PCWP 10 mmHg). Upper normal to mildly elevated right heart filling pressure (mean RA 7, RVEDP 10 mmHg). Mildly to moderately reduced Fick cardiac output/index (CO 4.2 L/min, CI 2.1 L/min/m^2). Recommendations: Ongoing workup and management of severe pulmonary hypertension per Drs. Dgayli and Agbor-Etang. Sammy Crisp, MD [facility]  MM 3D SCREENING MAMMOGRAM BILATERAL BREAST Result Date: 05/06/2024 CLINICAL DATA:  Screening. EXAM: DIGITAL SCREENING  BILATERAL MAMMOGRAM WITH TOMOSYNTHESIS AND CAD TECHNIQUE: Bilateral screening digital craniocaudal and mediolateral oblique mammograms were obtained. Bilateral screening digital breast tomosynthesis was performed. The images were evaluated with computer-aided detection. COMPARISON:  Previous exam(s). ACR Breast Density Category a: The breasts are almost entirely fatty. FINDINGS: There are no findings suspicious for malignancy. IMPRESSION: No mammographic evidence of malignancy. A result letter of this screening mammogram will be mailed directly to the patient. RECOMMENDATION: Screening mammogram in one year. (Code:[id number]-B-01Y) BI-RADS CATEGORY  1: Negative. Electronically Signed   By: Roda Cirri M.D.   On: 05/06/2024 14:48    Microbiology: Results for orders placed or performed during the hospital encounter of 03/11/24  Resp panel by RT-PCR (RSV, Flu A&B, Covid) Anterior Nasal Swab     Status: None   Collection Time: 03/11/24  5:50 PM   Specimen: Anterior Nasal Swab  Result Value Ref Range Status   SARS Coronavirus 2 by RT PCR NEGATIVE NEGATIVE Final    Comment: (NOTE) SARS-CoV-2 target nucleic acids are NOT DETECTED.  The SARS-CoV-2 RNA is generally detectable in upper respiratory specimens during the acute phase of infection. The lowest concentration of SARS-CoV-2 viral copies this assay can detect is 138 copies/mL. A negative  result does not preclude SARS-Cov-2 infection and should not be used as the sole basis for treatment or other patient management decisions. A negative result may occur with  improper specimen collection/handling, submission of specimen other than nasopharyngeal swab, presence of viral mutation(s) within the areas targeted by this assay, and inadequate number of viral copies(<138 copies/mL). A negative result must be combined with clinical observations, patient history, and epidemiological information. The expected result is Negative.  Fact Sheet for Patients:  BloggerCourse.com  Fact Sheet for Healthcare Providers:  SeriousBroker.it  This test is no t yet approved or cleared by the [place]  FDA and  has been authorized for detection and/or diagnosis of SARS-CoV-2 by FDA under an Emergency Use Authorization (EUA). This EUA will remain  in effect (meaning this test can be used) for the duration of the COVID-19 declaration under Section 564(b)(1) of the Act, 21 U.S.C.section 360bbb-3(b)(1), unless the authorization is terminated  or revoked sooner.       Influenza A by PCR NEGATIVE NEGATIVE Final   Influenza B by PCR NEGATIVE NEGATIVE Final    Comment: (NOTE) The Xpert Xpress SARS-CoV-2/FLU/RSV plus assay is intended as an aid in the diagnosis of influenza from Nasopharyngeal swab specimens and should not be used as a sole basis for treatment. Nasal washings and aspirates are unacceptable for Xpert Xpress SARS-CoV-2/FLU/RSV testing.  Fact Sheet for Patients: BloggerCourse.com  Fact Sheet for Healthcare Providers: SeriousBroker.it  This test is not yet approved or cleared by the [place]  FDA and has been authorized for detection and/or diagnosis of SARS-CoV-2 by FDA under an Emergency Use Authorization (EUA). This EUA will remain in effect (meaning this test can be used)  for the duration of the COVID-19 declaration under Section 564(b)(1) of the Act, 21 U.S.C. section 360bbb-3(b)(1), unless the authorization is terminated or revoked.     Resp Syncytial Virus by PCR NEGATIVE NEGATIVE Final    Comment: (NOTE) Fact Sheet for Patients: BloggerCourse.com  Fact Sheet for Healthcare Providers: SeriousBroker.it  This test is not yet approved or cleared by the [place]  FDA and has been authorized for detection and/or diagnosis of SARS-CoV-2 by FDA under an Emergency Use Authorization (EUA). This EUA will remain in effect (meaning this test can be used) for the  duration of the COVID-19 declaration under Section 564(b)(1) of the Act, 21 U.S.C. section 360bbb-3(b)(1), unless the authorization is terminated or revoked.  Performed at [facility], [address] [place], [place] [address]   Blood culture (routine x 2)     Status: None   Collection Time: 03/11/24  6:00 PM   Specimen: BLOOD RIGHT FOREARM  Result Value Ref Range Status   Specimen Description   Final    BLOOD RIGHT FOREARM Performed at [facility], [address] [place], [place] [address]    Special Requests   Final    BOTTLES DRAWN AEROBIC AND ANAEROBIC Blood Culture adequate volume Performed at [facility], [address] [place], [place] [address]    Culture   Final    NO GROWTH 5 DAYS Performed at [facility], [address] [place], [place] [address]    Report Status 03/17/2024 FINAL  Final  Blood culture (routine x 2)     Status: None   Collection Time: 03/11/24  9:45 PM   Specimen: BLOOD  Result Value Ref Range Status   Specimen Description   Final    BLOOD LEFT ANTECUBITAL Performed at [facility], [address], [place], [place] [address]    Special Requests   Final    BOTTLES DRAWN AEROBIC AND ANAEROBIC Blood Culture adequate  volume Performed at [facility], [address] [place], [place] [address]    Culture   Final    NO GROWTH 5 DAYS Performed at [facility], [address], [place], [place] [address]    Report Status 03/17/2024 FINAL  Final    Labs: CBC: Recent Labs  Lab 05/18/24 0937 05/21/24 0839  WBC  --  3.7*  NEUTROABS  --  2.3  HGB 17.7* 16.1*  HCT 52.0* 50.3*  MCV  --  80.5  PLT  --  228   Basic Metabolic Panel: Recent Labs  Lab 05/18/24 0937 05/21/24 0839  NA 139 132*  K 3.8 3.8  CL  --  105  CO2  --  18*  GLUCOSE  --  92  BUN  --  17  CREATININE  --  0.86  CALCIUM  --  8.5*   Liver Function Tests: Recent Labs  Lab 05/21/24 0839  AST 17  ALT 16  ALKPHOS 21*  BILITOT 1.5*  PROT 5.8*  ALBUMIN 3.3*   CBG: No results for input(s): GLUCAP in the last 168 hours.  Discharge time spent: {LESS THAN/GREATER NGEX:52841} 30 minutes.  Signed: Montey Apa, DO [facility] 05/23/2024

## 2024-05-24 ENCOUNTER — Telehealth: Payer: Self-pay

## 2024-05-24 NOTE — Transitions of Care (Post Inpatient/ED Visit) (Signed)
 05/24/2024  Name: Virginia Camacho MRN: [id number] DOB: [birth year]  Today's TOC FU Call Status: Today's TOC FU Call Status:: Successful TOC FU Call Completed TOC FU Call Complete Date: 05/24/24 Patient's Name and Date of Birth confirmed.  Transition Care Management Follow-up Telephone Call Date of Discharge: 05/23/24 Discharge Facility: [facility]) Type of Discharge: Inpatient Admission Primary Inpatient Discharge Diagnosis:: cerebral infarctin How have you been since you were released from the hospital?: Better Any questions or concerns?: No  Items Reviewed: Did you receive and understand the discharge instructions provided?: Yes Medications obtained,verified, and reconciled?: Yes (Medications Reviewed) Any new allergies since your discharge?: No Dietary orders reviewed?: Yes  Medications Reviewed Today: Medications Reviewed Today     Reviewed by Darrall Ellison, LPN (Licensed Practical Nurse) on 05/24/24 at [phone]  Med List Status: <None>   Medication Order Taking? Sig Documenting Provider Last Dose Status Informant  albuterol  (ACCUNEB ) 1.25 MG/3ML nebulizer solution [id number] Yes Take 3 mLs (1.25 mg total) by nebulization every 6 (six) hours as needed for wheezing. Quinton Buckler, FNP  Active Self, Pharmacy Records           Med Note [facility], JANET L   Tue May 18, 2024  8:09 AM) Patient states, I haven't used it yet.  albuterol  (VENTOLIN  HFA) 108 (90 Base) MCG/ACT inhaler [id number] Yes Inhale 1-2 puffs into the lungs every 6 (six) hours as needed for wheezing or shortness of breath. [provider]  Active Self, Pharmacy Records  amoxicillin-clavulanate (AUGMENTIN) 875-125 MG tablet [id number] Yes Take 1 tablet by mouth 2 (two) times daily for 5 days. Darus Engels A, DO  Active   aspirin EC 81 MG tablet [id number] Yes Take 1 tablet (81 mg total) by mouth daily. Swallow whole. Darus Engels A, DO  Active   atorvastatin (LIPITOR) 40 MG  tablet [id number] Yes Take 1 tablet (40 mg total) by mouth daily. Darus Engels A, DO  Active   bisoprolol  (ZEBETA ) 5 MG tablet [id number] Yes Take 0.5 tablets (2.5 mg total) by mouth daily. Ronald Cockayne, NP  Active Self, Pharmacy Records  cholecalciferol (VITAMIN D3) 25 MCG (1000 UNIT) tablet [id number] Yes Take 1,000 Units by mouth daily. [provider]  Active Self, Pharmacy Records  clopidogrel (PLAVIX) 75 MG tablet [id number] Yes Take 1 tablet (75 mg total) by mouth daily. Darus Engels A, DO  Active   escitalopram  (LEXAPRO ) 10 MG tablet [id number] Yes Take 1 tablet (10 mg total) by mouth daily. Quinton Buckler, FNP  Active Self, Pharmacy Records  ferrous sulfate  324 MG TBEC [id number] Yes Take 324 mg by mouth daily. [provider]  Active Self, Pharmacy Records  furosemide  (LASIX ) 20 MG tablet [id number] Yes Take 1 tablet (20 mg total) by mouth daily. Ronald Cockayne, NP  Active Self, Pharmacy Records  losartan  (COZAAR ) 25 MG tablet [id number] Yes Take 0.5 tablets (12.5 mg total) by mouth daily. Ronald Cockayne, NP  Active Self, Pharmacy Records  naproxen  (NAPROSYN ) 500 MG tablet [id number] Yes Take 1 tablet (500 mg total) by mouth 2 (two) times daily with a meal.  Patient taking differently: Take 500 mg by mouth 2 (two) times daily as needed for mild pain (pain score 1-3) or moderate pain (pain score 4-6).   Quinton Buckler, FNP  Active Self, Pharmacy Records           Med Note Maryetta Sneddon May 17, 2024  3:05 PM)    Kennedy Peabody [id number] Yes  Pt uses a c-pap nightly [provider]  Active Self, Pharmacy Records  Tiotropium Bromide Monohydrate  (SPIRIVA  RESPIMAT) 2.5 MCG/ACT AERS [id number] Yes Inhale 2 puffs into the lungs daily. [name], MD  Active Self, Pharmacy Records  [facility] INHUB 100-50 MCG/ACT AEPB [id number] Yes INHALE 1 DOSE BY MOUTH TWICE A DAY Quinton Buckler, FNP  Active Self, Pharmacy Records            Home Care and  Equipment/Supplies: Were Home Health Services Ordered?: NA Any new equipment or medical supplies ordered?: Yes Name of Medical supply agency?: [employer] Were you able to get the equipment/medical supplies?: No Do you have any questions related to the use of the equipment/supplies?: No  Functional Questionnaire: Do you need assistance with bathing/showering or dressing?: No Do you need assistance with meal preparation?: No Do you need assistance with eating?: No Do you have difficulty maintaining continence: No Do you need assistance with getting out of bed/getting out of a chair/moving?: No Do you have difficulty managing or taking your medications?: No  Follow up appointments reviewed: PCP Follow-up appointment confirmed?: Yes Date of PCP follow-up appointment?: 06/02/24 Follow-up Provider: [facility] Follow-up appointment confirmed?: Yes Date of Specialist follow-up appointment?: 07/16/24 Follow-Up Specialty Provider:: cardio Do you need transportation to your follow-up appointment?: No Do you understand care options if your condition(s) worsen?: Yes-patient verbalized understanding    SIGNATURE Darrall Ellison, LPN [facility] Nurse Health Advisor Direct Dial [phone]

## 2024-06-01 ENCOUNTER — Ambulatory Visit: Admitting: Gastroenterology

## 2024-06-01 ENCOUNTER — Encounter: Payer: Self-pay | Admitting: Gastroenterology

## 2024-06-01 VITALS — BP 104/72 | HR 90 | Ht 64.0 in | Wt 222.0 lb

## 2024-06-01 DIAGNOSIS — I639 Cerebral infarction, unspecified: Secondary | ICD-10-CM

## 2024-06-01 DIAGNOSIS — K2289 Other specified disease of esophagus: Secondary | ICD-10-CM | POA: Diagnosis not present

## 2024-06-01 DIAGNOSIS — J42 Unspecified chronic bronchitis: Secondary | ICD-10-CM

## 2024-06-01 DIAGNOSIS — R9389 Abnormal findings on diagnostic imaging of other specified body structures: Secondary | ICD-10-CM | POA: Diagnosis not present

## 2024-06-01 DIAGNOSIS — I6381 Other cerebral infarction due to occlusion or stenosis of small artery: Secondary | ICD-10-CM

## 2024-06-01 DIAGNOSIS — Z87891 Personal history of nicotine dependence: Secondary | ICD-10-CM

## 2024-06-01 NOTE — Progress Notes (Signed)
 There were no vitals taken for this visit.   Subjective:    Patient ID: Virginia Camacho, female    DOB: [birth year], 43 y.o.   MRN: [id number]  HPI: Virginia Camacho is a 43 y.o. female  No chief complaint on file.   Discussed the use of AI scribe software for clinical note transcription with the patient, who gave verbal consent to proceed.  History of Present Illness          04/21/2024    9:26 AM 03/11/2024    2:51 PM 10/22/2023    3:11 PM  Depression screen PHQ 2/9  Decreased Interest 0 0 0  Down, Depressed, Hopeless 0 0   PHQ - 2 Score 0 0 0  Altered sleeping 0 1   Tired, decreased energy 0 1   Change in appetite 0 1   Feeling bad or failure about yourself  0 0   Trouble concentrating 0 0   Moving slowly or fidgety/restless 0 0   Suicidal thoughts 0 0   PHQ-9 Score 0 3   Difficult doing work/chores Not difficult at all Not difficult at all     Relevant past medical, surgical, family and social history reviewed and updated as indicated. Interim medical history since our last visit reviewed. Allergies and medications reviewed and updated.  Review of Systems  Per HPI unless specifically indicated above     Objective:     There were no vitals taken for this visit.  {Vitals History (Optional):23777} Wt Readings from Last 3 Encounters:  06/01/24 222 lb (100.7 kg)  05/21/24 227 lb 1.2 oz (103 kg)  05/18/24 227 lb 9.6 oz (103.2 kg)    Physical Exam Physical Exam    Results for orders placed or performed during the hospital encounter of 05/21/24  Ethanol   Collection Time: 05/21/24  8:39 AM  Result Value Ref Range   Alcohol, Ethyl (B) <15 <15 mg/dL  Protime-INR   Collection Time: 05/21/24  8:39 AM  Result Value Ref Range   Prothrombin Time 15.4 (H) 11.4 - 15.2 seconds   INR 1.2 0.8 - 1.2  APTT   Collection Time: 05/21/24  8:39 AM  Result Value Ref Range   aPTT 32 24 - 36 seconds  CBC   Collection Time: 05/21/24  8:39 AM  Result Value Ref Range    WBC 3.7 (L) 4.0 - 10.5 K/uL   RBC 6.25 (H) 3.87 - 5.11 MIL/uL   Hemoglobin 16.1 (H) 12.0 - 15.0 g/dL   HCT 49.6 (H) 63.9 - 53.9 %   MCV 80.5 80.0 - 100.0 fL   MCH 25.8 (L) 26.0 - 34.0 pg   MCHC 32.0 30.0 - 36.0 g/dL   RDW 79.2 (H) 88.4 - 84.4 %   Platelets 228 150 - 400 K/uL   nRBC 0.0 0.0 - 0.2 %  Differential   Collection Time: 05/21/24  8:39 AM  Result Value Ref Range   Neutrophils Relative % 63 %   Neutro Abs 2.3 1.7 - 7.7 K/uL   Lymphocytes Relative 26 %   Lymphs Abs 1.0 0.7 - 4.0 K/uL   Monocytes Relative 8 %   Monocytes Absolute 0.3 0.1 - 1.0 K/uL   Eosinophils Relative 2 %   Eosinophils Absolute 0.1 0.0 - 0.5 K/uL   Basophils Relative 1 %   Basophils Absolute 0.0 0.0 - 0.1 K/uL   Immature Granulocytes 0 %   Abs Immature Granulocytes 0.01 0.00 - 0.07 K/uL  Comprehensive metabolic  panel   Collection Time: 05/21/24  8:39 AM  Result Value Ref Range   Sodium 132 (L) 135 - 145 mmol/L   Potassium 3.8 3.5 - 5.1 mmol/L   Chloride 105 98 - 111 mmol/L   CO2 18 (L) 22 - 32 mmol/L   Glucose, Bld 92 70 - 99 mg/dL   BUN 17 6 - 20 mg/dL   Creatinine, Ser 9.13 0.44 - 1.00 mg/dL   Calcium  8.5 (L) 8.9 - 10.3 mg/dL   Total Protein 5.8 (L) 6.5 - 8.1 g/dL   Albumin 3.3 (L) 3.5 - 5.0 g/dL   AST 17 15 - 41 U/L   ALT 16 0 - 44 U/L   Alkaline Phosphatase 21 (L) 38 - 126 U/L   Total Bilirubin 1.5 (H) 0.0 - 1.2 mg/dL   GFR, Estimated >39 >39 mL/min   Anion gap 9 5 - 15  Hemoglobin A1c   Collection Time: 05/21/24  8:39 AM  Result Value Ref Range   Hgb A1c MFr Bld 5.6 4.8 - 5.6 %   Mean Plasma Glucose 114.02 mg/dL  Urine Drug Screen, Qualitative   Collection Time: 05/21/24 10:00 AM  Result Value Ref Range   Tricyclic, Ur Screen NONE DETECTED NONE DETECTED   Amphetamines, Ur Screen NONE DETECTED NONE DETECTED   MDMA (Ecstasy)Ur Screen NONE DETECTED NONE DETECTED   Cocaine Metabolite,Ur [place] NONE DETECTED NONE DETECTED   Opiate, Ur Screen NONE DETECTED NONE DETECTED   Phencyclidine  (PCP) Ur S NONE DETECTED NONE DETECTED   Cannabinoid 50 Ng, Ur [place] NONE DETECTED NONE DETECTED   Barbiturates, Ur Screen NONE DETECTED NONE DETECTED   Benzodiazepine, Ur Scrn NONE DETECTED NONE DETECTED   Methadone Scn, Ur NONE DETECTED NONE DETECTED  POC urine preg, ED   Collection Time: 05/21/24 10:08 AM  Result Value Ref Range   Preg Test, Ur Negative Negative  Lipid panel   Collection Time: 05/22/24  2:33 AM  Result Value Ref Range   Cholesterol 162 0 - 200 mg/dL   Triglycerides 67 <849 mg/dL   HDL 36 (L) >59 mg/dL   Total CHOL/HDL Ratio 4.5 RATIO   VLDL 13 0 - 40 mg/dL   LDL Cholesterol 886 (H) 0 - 99 mg/dL  D-dimer, quantitative   Collection Time: 05/22/24  7:21 PM  Result Value Ref Range   D-Dimer, Quant 0.81 (H) 0.00 - 0.50 ug/mL-FEU  Brain natriuretic peptide   Collection Time: 05/22/24  7:21 PM  Result Value Ref Range   B Natriuretic Peptide 208.7 (H) 0.0 - 100.0 pg/mL  Basic metabolic panel with GFR   Collection Time: 05/23/24 11:09 AM  Result Value Ref Range   Sodium 137 135 - 145 mmol/L   Potassium 4.3 3.5 - 5.1 mmol/L   Chloride 109 98 - 111 mmol/L   CO2 21 (L) 22 - 32 mmol/L   Glucose, Bld 104 (H) 70 - 99 mg/dL   BUN 15 6 - 20 mg/dL   Creatinine, Ser 9.14 0.44 - 1.00 mg/dL   Calcium  9.0 8.9 - 10.3 mg/dL   GFR, Estimated >39 >39 mL/min   Anion gap 7 5 - 15   {Labs (Optional):23779}       Assessment & Plan:   Problem List Items Addressed This Visit   None    Assessment and Plan Assessment & Plan         Follow up plan: No follow-ups on file.

## 2024-06-01 NOTE — Progress Notes (Signed)
 Chief Complaint:esophageal mass, abnormal CT chest Primary GI Doctor: Dr. Wilhelmenia   HPI:  Patient is a  43  year old A.A. female patient with past medical history of asthma, emphysema, obstructive sleep apnea on CPAP,COPD,chronic HFpEF, severe pulmonary hypertension,  chronic migraines, chronic back pain, and morbid obesity, who was referred to me by Gareth Mliss FALCON, FNP on 03/16/24 for a complaint of esophageal mass .    03/11/2024 patient presented to ED with for shortness of breath and hypoxia. Pulse ox was noted to be 70% on room air. CT of the chest revealed diffuse infiltrates suggestive of infection versus edema. She was noted to have wheezing. The patient was started on IV Lasix , IV antibiotics and IV steroids.   05/21/24 patient presented to ED for sudden onset of vision problems, unsteady gait. 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 Patient denies current issues with GERD or dysphagia. She reports history of GERD, but with dietary changes and not eating late her symptoms are controlled. Patient denies nausea, vomiting, or unexplained weight loss. Patient denies altered bowel habits, abdominal pain, or rectal bleeding     She recently was hospitalized for stroke and reports overall she is recovering well. She has intermittent issues with blurry vision. Patient on Plavix  75 mg po daily and ASA 81 mg po daily.  No follow-up with neurology scheduled yet,  She reports a dry cough but states it has improved some since she hospitalized in April. Patient on 2L/min oxygen with activity at home as needed. She states she has used it a few times with exertion or walking up stairs.   No alcohol use. Nonsmoker.   Patient never had EGD or  colonoscopy.  Patient's family history includes maternal grandmother breast CA, paternal grandfather with oral CA (smoker)  Wt Readings from Last 3 Encounters:  06/01/24 222 lb (100.7 kg)  05/21/24 227 lb 1.2 oz (103 kg)  05/18/24 227 lb 9.6 oz (103.2 kg)    Past Medical History:  Diagnosis Date   Allergy    Asthma    Back pain    Diastolic heart failure (HCC) 03/2024   Emphysema lung (HCC)    Morbid obesity with BMI of 40.0-44.9, adult (HCC) 03/20/2016    Past Surgical History:  Procedure Laterality Date   KNEE SURGERY Right    RIGHT HEART CATH Right 05/18/2024   Procedure: RIGHT HEART CATH;  Surgeon: Mady Bruckner, MD;  Location: [facility] INVASIVE CV LAB;  Service: Cardiovascular;  Laterality: Right;    Current Outpatient Medications  Medication Sig Dispense Refill   albuterol  (ACCUNEB ) 1.25 MG/3ML nebulizer solution Take 3 mLs (1.25 mg total) by nebulization every 6 (six) hours as needed for wheezing. 75 mL 12   albuterol  (VENTOLIN  HFA) 108 (90 Base) MCG/ACT inhaler Inhale 1-2 puffs into the lungs every 6 (six) hours as needed for wheezing or shortness of breath.     aspirin  EC 81 MG tablet Take 1 tablet (81 mg total) by mouth daily. Swallow whole. 30 tablet 2   atorvastatin  (LIPITOR) 40 MG tablet Take 1 tablet (40 mg total) by mouth daily. 30 tablet 2   atorvastatin  (LIPITOR) 40 MG tablet Take 40 mg by mouth daily.     bisoprolol  (ZEBETA ) 5  MG tablet Take 0.5 tablets (2.5 mg total) by mouth daily. 45 tablet 3   cholecalciferol (VITAMIN D3) 25 MCG (1000 UNIT) tablet Take 1,000 Units by mouth daily.     clopidogrel  (PLAVIX ) 75 MG tablet Take 1 tablet (75 mg total) by mouth daily. 30 tablet 2   escitalopram  (LEXAPRO ) 10 MG tablet Take 1 tablet (10 mg total) by mouth daily. 90 tablet 0   ferrous sulfate  324 MG TBEC Take 324 mg by mouth daily.     furosemide  (LASIX ) 20 MG tablet Take 1 tablet (20 mg total) by mouth daily. 60 tablet 6   losartan  (COZAAR ) 25 MG tablet Take 0.5  tablets (12.5 mg total) by mouth daily. 30 tablet 6   NON FORMULARY Pt uses a c-pap nightly     Tiotropium Bromide  Monohydrate (SPIRIVA  RESPIMAT) 2.5 MCG/ACT AERS Inhale 2 puffs into the lungs daily. 60 each 12   WIXELA INHUB 100-50 MCG/ACT AEPB INHALE 1 DOSE BY MOUTH TWICE A DAY 180 each 1   naproxen  (NAPROSYN ) 500 MG tablet Take 1 tablet (500 mg total) by mouth 2 (two) times daily with a meal. (Patient not taking: Reported on 06/01/2024) 20 tablet 0   No current facility-administered medications for this visit.    Allergies as of 06/01/2024 - Review Complete 06/01/2024  Allergen Reaction Noted   Clarithromycin Hives and Nausea Only 09/28/2015   Oxycodone Hives 09/28/2015    Family History  Problem Relation Age of Onset   Heart disease Mother    Diabetes Mother    Breast cancer Maternal Grandmother    Heart disease Maternal Grandfather     Review of Systems:    Constitutional: No weight loss, fever, chills, weakness or fatigue HEENT: Eyes: No change in vision               Ears, Nose, Throat:  No change in hearing or congestion Skin: No rash or itching Cardiovascular: No chest pain, chest pressure or palpitations   Respiratory: No SOB or cough Gastrointestinal: See HPI and otherwise negative Genitourinary: No dysuria or change in urinary frequency Neurological: No headache, dizziness or syncope Musculoskeletal: No new muscle or joint pain Hematologic: No bleeding or bruising Psychiatric: No history of depression or anxiety    Physical Exam:  Vital signs: BP 104/72   Pulse 90   Ht 5' 4 (1.626 m)   Wt 222 lb (100.7 kg)   BMI 38.11 kg/m   Constitutional:   Pleasant A.A female appears to be in NAD, Well developed, Well nourished, alert and cooperative Throat: Oral cavity and pharynx without inflammation, swelling or lesion.  Respiratory: Respirations even and unlabored. Lungs clear to auscultation bilaterally.   No wheezes, crackles, or rhonchi.  Cardiovascular: Normal  S1, S2. Regular rate and rhythm. No peripheral edema, cyanosis or pallor.  Gastrointestinal:  Soft, nondistended, nontender. No rebound or guarding. Normal bowel sounds. No appreciable masses or hepatomegaly. Rectal:  Not performed.  Msk:  Symmetrical without gross deformities. Without edema, no deformity or joint abnormality.  Neurologic:  Alert and  oriented x4;  grossly normal neurologically.  Skin:   Dry and intact without significant lesions or rashes. Psychiatric: Oriented to person, place and time. Demonstrates good judgement and reason without abnormal affect or behaviors.  RELEVANT LABS AND IMAGING: CBC    Latest Ref Rng & Units 05/21/2024    8:39 AM 05/18/2024    9:37 AM 05/04/2024   11:22 AM  CBC  WBC 4.0 - 10.5 K/uL 3.7  4.8   Hemoglobin 12.0 - 15.0 g/dL 83.8  82.2  81.5   Hematocrit 36.0 - 46.0 % 50.3  52.0  57.9   Platelets 150 - 400 K/uL 228   253      CMP     Latest Ref Rng & Units 05/23/2024   11:09 AM 05/21/2024    8:39 AM 05/18/2024    9:37 AM  CMP  Glucose 70 - 99 mg/dL 895  92    BUN 6 - 20 mg/dL 15  17    Creatinine 9.55 - 1.00 mg/dL 9.14  9.13    Sodium 864 - 145 mmol/L 137  132  139   Potassium 3.5 - 5.1 mmol/L 4.3  3.8  3.8   Chloride 98 - 111 mmol/L 109  105    CO2 22 - 32 mmol/L 21  18    Calcium  8.9 - 10.3 mg/dL 9.0  8.5    Total Protein 6.5 - 8.1 g/dL  5.8    Total Bilirubin 0.0 - 1.2 mg/dL  1.5    Alkaline Phos 38 - 126 U/L  21    AST 15 - 41 U/L  17    ALT 0 - 44 U/L  16       Lab Results  Component Value Date   TSH 0.60 05/31/2021  03/12/24 echo-  Left ventricular ejection fraction, by estimation, is 55%.  03/11/2024 CT ANGIOGRAPHY CHEST WITH CONTRAST  IMPRESSION: 1. No pulmonary embolism. 2. Interval development of diffuse interstitial infiltrate with more extensive consolidation within the right lower lobe most in keeping with acute infection. A component of superimposed edema is difficult to exclude. 3. Stable retrocardiac,  paraesophageal low-attenuation mass measuring at least 5.0 x 8.5 x 10.3 cm in greatest dimension. This is not well characterized on this examination due to phase of contrast enhancement. As noted previously, this Tc [place] represent a cystic collection such as a foregut duplication cyst though a esophageal mass such as a leiomyoma or lymphadenopathy is not completely excluded on this limited examination. If indicated, this could be better assessed with contrast enhanced MRI examination or endoscopic ultrasound.   Emphysema (ICD10-J43.9).  Assessment: Encounter Diagnoses  Name Primary?   Abnormal CT of the chest Yes   Esophageal mass    Right thalamic stroke [facility])    Chronic bronchitis, unspecified chronic bronchitis type [facility])      43 year old African-American female patient that presents for evaluation for abnormal CT chest (03/2024) that was done during hospitalization for COPD exacerbation which showed Stable retrocardiac, paraesophageal low-attenuation mass measuring at least 5.0 x 8.5 x 10.3 cm in greatest dimension.  Pt does not have any dysphagia. Recommendations were given for MRI or endoscopic ultrasound to further evaluate.  Patient still requires supplemental oxygen at home prn.  Patient also suffered an acute right thalamic stroke 2 weeks ago and placed on Plavix  and aspirin .   Patient is considered a high risk for any endoscopic procedures given her recent stroke (on Plavix ) and need for supplemental oxygen. Will discuss case with Dr. Wilhelmenia to see how he would like to proceed.   Plan: -Follow-up with pulmonology  -Follow-up with cardiology - Follow-up neurology  -defer MRI imaging to Dr. Wilhelmenia -defer endoscopic ultrasound until patient stable  Thank you for the courtesy of this consult. Please call me with any questions or concerns.   Naya Ilagan, FNP-C  Gastroenterology 06/01/2024, 10:21 AM  Cc: Gareth Mliss FALCON, FNP

## 2024-06-01 NOTE — Patient Instructions (Addendum)
 Follow-up with neurology. Follow up with cardiology. Follow up with pulmonology.  Will discuss case with Dr. Wilhelmenia our advanced endoscopist to see how he would like to pursue the workup.  The office should give you a call in the next few business days.  Thank you for entrusting me with your care and for choosing [facility], [place] May, NP   If your blood pressure at your visit was 140/90 or greater, please contact your primary care physician to follow up on this. ______________________________________________________  If you are age 31 or older, your body mass index should be between 23-30. Your Body mass index is 38.11 kg/m. If this is out of the aforementioned range listed, please consider follow up with your Primary Care Provider.  If you are age 12 or younger, your body mass index should be between 19-25. Your Body mass index is 38.11 kg/m. If this is out of the aformentioned range listed, please consider follow up with your Primary Care Provider.  ________________________________________________________  The [facility] providers would like to encourage you to use MYCHART to communicate with providers for non-urgent requests or questions.  Due to long hold times on the telephone, sending your provider a message by [facility] Camacho be a faster and more efficient way to get a response.  Please allow 48 business hours for a response.  Please remember that this is for non-urgent requests.  _______________________________________________________  Due to recent changes in healthcare laws, you Camacho see the results of your imaging and laboratory studies on MyChart before your provider has had a chance to review them.  We understand that in some cases there Camacho be results that are confusing or concerning to you. Not all laboratory results come back in the same time frame and the provider Camacho be waiting for multiple results in order to interpret others.  Please give us  48 hours in order for  your provider to thoroughly review all the results before contacting the office for clarification of your results.

## 2024-06-02 ENCOUNTER — Encounter: Payer: Self-pay | Admitting: Nurse Practitioner

## 2024-06-02 ENCOUNTER — Other Ambulatory Visit: Payer: Self-pay | Admitting: Nurse Practitioner

## 2024-06-02 ENCOUNTER — Ambulatory Visit: Admitting: Nurse Practitioner

## 2024-06-02 VITALS — BP 126/72 | HR 95 | Temp 97.5°F | Resp 18 | Ht 64.0 in | Wt 222.0 lb

## 2024-06-02 DIAGNOSIS — Z09 Encounter for follow-up examination after completed treatment for conditions other than malignant neoplasm: Secondary | ICD-10-CM | POA: Diagnosis not present

## 2024-06-02 DIAGNOSIS — J189 Pneumonia, unspecified organism: Secondary | ICD-10-CM

## 2024-06-02 DIAGNOSIS — Z8673 Personal history of transient ischemic attack (TIA), and cerebral infarction without residual deficits: Secondary | ICD-10-CM

## 2024-06-02 NOTE — Progress Notes (Signed)
 Attending Physician's Attestation   I have reviewed the chart.   I agree with the Advanced Practitioner's note, impression, and recommendations with any updates as below. Very, Plex recent medical history and longer-term high risk individual.  With recent stroke, anesthesia consideration is high for potential complications.  This has been seen for a longer period of time.  This most likely is benign in nature.  If this is a duplication cyst, aspiration via EUS carries high risk for causing mediastinitis.  This patient does not need further issues to develop.  I would recommend, especially in light of her being on anticoagulation and antiplatelet therapy with her pulmonary hypertension, that an MRI chest be performed to specifically look at this mediastinal mass.  If there is concern that there is intraluminal pathology, then a high risk upper endoscopy can be considered.  I would not pursue EUS or EGD at this time especially again in light of her other significant comorbidities at this time.  Agree with follow-up with her other providers to get her more stable/optimized.  Set up of a follow-up in clinic 8 with APP or myself so that this patient does not get lost to follow-up.   Aloha Finner, MD  Gastroenterology Advanced Endoscopy Office # [phone]

## 2024-06-03 ENCOUNTER — Ambulatory Visit: Payer: Self-pay | Admitting: Nurse Practitioner

## 2024-06-03 LAB — COMPREHENSIVE METABOLIC PANEL WITH GFR
AG Ratio: 1.5 (calc) (ref 1.0–2.5)
ALT: 14 U/L (ref 6–29)
AST: 13 U/L (ref 10–30)
Albumin: 4 g/dL (ref 3.6–5.1)
Alkaline phosphatase (APISO): 41 U/L (ref 31–125)
BUN: 15 mg/dL (ref 7–25)
CO2: 23 mmol/L (ref 20–32)
Calcium: 9.7 mg/dL (ref 8.6–10.2)
Chloride: 105 mmol/L (ref 98–110)
Creat: 0.86 mg/dL (ref 0.50–0.99)
Globulin: 2.7 g/dL (ref 1.9–3.7)
Glucose, Bld: 82 mg/dL (ref 65–99)
Potassium: 5.1 mmol/L (ref 3.5–5.3)
Sodium: 140 mmol/L (ref 135–146)
Total Bilirubin: 0.9 mg/dL (ref 0.2–1.2)
Total Protein: 6.7 g/dL (ref 6.1–8.1)
eGFR: 86 mL/min/{1.73_m2} (ref >=60)

## 2024-06-03 LAB — CBC WITH DIFFERENTIAL/PLATELET
Absolute Lymphocytes: 1149 {cells}/uL (ref 850–3900)
Absolute Monocytes: 289 {cells}/uL (ref 200–950)
Basophils Absolute: 51 {cells}/uL (ref 0–200)
Basophils Relative: 1.5 %
Eosinophils Absolute: 58 {cells}/uL (ref 15–500)
Eosinophils Relative: 1.7 %
HCT: 57.9 % — ABNORMAL HIGH (ref 35.0–45.0)
Hemoglobin: 17.8 g/dL — ABNORMAL HIGH (ref 11.7–15.5)
MCH: 26.1 pg — ABNORMAL LOW (ref 27.0–33.0)
MCHC: 30.7 g/dL — ABNORMAL LOW (ref 32.0–36.0)
MCV: 85 fL (ref 80.0–100.0)
MPV: 10.7 fL (ref 7.5–12.5)
Monocytes Relative: 8.5 %
Neutro Abs: 1853 {cells}/uL (ref 1500–7800)
Neutrophils Relative %: 54.5 %
Platelets: 227 10*3/uL (ref 140–400)
RBC: 6.81 10*6/uL — ABNORMAL HIGH (ref 3.80–5.10)
RDW: 19.3 % — ABNORMAL HIGH (ref 11.0–15.0)
Total Lymphocyte: 33.8 %
WBC: 3.4 10*3/uL — ABNORMAL LOW (ref 3.8–10.8)

## 2024-06-04 ENCOUNTER — Telehealth: Payer: Self-pay

## 2024-06-04 ENCOUNTER — Other Ambulatory Visit: Payer: Self-pay

## 2024-06-04 DIAGNOSIS — R9389 Abnormal findings on diagnostic imaging of other specified body structures: Secondary | ICD-10-CM

## 2024-06-04 NOTE — Progress Notes (Signed)
 Mediastinal mass

## 2024-06-04 NOTE — Telephone Encounter (Signed)
-----   Message from Cathryne PARAS May sent at 06/02/2024  7:04 AM EDT ----- Regarding: orders Karna- This is a patient I saw yesterday in am can you place this order please with Dr. Treasure name on it  so he gets it.  Order MRI chest Dx:  mediastinal mass.     Set up of a follow-up in clinic 8 with APP or Dr. Wilhelmenia so that this patient does not get lost to follow-up.  Karna explain to patient he feels the EUS procedure we discussed is too high risk right now he would like to get the MRI first.  Deanna, NP ----- Message ----- From: Wilhelmenia Aloha Raddle., MD Sent: 06/02/2024   6:47 AM EDT To: Odetta LITTIE Curly, RN; Cathryne PARAS May, NP

## 2024-06-09 ENCOUNTER — Encounter: Payer: Self-pay | Admitting: Student in an Organized Health Care Education/Training Program

## 2024-06-09 ENCOUNTER — Encounter: Payer: Self-pay | Admitting: Family

## 2024-06-09 ENCOUNTER — Ambulatory Visit: Admitting: Family

## 2024-06-09 ENCOUNTER — Telehealth: Payer: Self-pay

## 2024-06-09 ENCOUNTER — Ambulatory Visit: Payer: Self-pay | Admitting: Family

## 2024-06-09 ENCOUNTER — Other Ambulatory Visit
Admission: RE | Admit: 2024-06-09 | Discharge: 2024-06-09 | Disposition: A | Discharge status: Home / Self Care | Attending: Family | Admitting: Family

## 2024-06-09 ENCOUNTER — Ambulatory Visit (INDEPENDENT_AMBULATORY_CARE_PROVIDER_SITE_OTHER): Admitting: Student in an Organized Health Care Education/Training Program

## 2024-06-09 VITALS — BP 126/86 | HR 90 | Temp 96.9°F | Ht 64.0 in | Wt 220.2 lb

## 2024-06-09 VITALS — BP 140/101 | HR 91 | Wt 222.0 lb

## 2024-06-09 DIAGNOSIS — I5032 Chronic diastolic (congestive) heart failure: Secondary | ICD-10-CM | POA: Insufficient documentation

## 2024-06-09 DIAGNOSIS — Z7902 Long term (current) use of antithrombotics/antiplatelets: Secondary | ICD-10-CM | POA: Diagnosis not present

## 2024-06-09 DIAGNOSIS — I6310 Cerebral infarction due to embolism of unspecified precerebral artery: Secondary | ICD-10-CM

## 2024-06-09 DIAGNOSIS — J439 Emphysema, unspecified: Secondary | ICD-10-CM | POA: Diagnosis not present

## 2024-06-09 DIAGNOSIS — H538 Other visual disturbances: Secondary | ICD-10-CM | POA: Diagnosis not present

## 2024-06-09 DIAGNOSIS — Z87891 Personal history of nicotine dependence: Secondary | ICD-10-CM | POA: Insufficient documentation

## 2024-06-09 DIAGNOSIS — Z9981 Dependence on supplemental oxygen: Secondary | ICD-10-CM | POA: Diagnosis not present

## 2024-06-09 DIAGNOSIS — J42 Unspecified chronic bronchitis: Secondary | ICD-10-CM

## 2024-06-09 DIAGNOSIS — J4520 Mild intermittent asthma, uncomplicated: Secondary | ICD-10-CM

## 2024-06-09 DIAGNOSIS — J9601 Acute respiratory failure with hypoxia: Secondary | ICD-10-CM

## 2024-06-09 DIAGNOSIS — I10 Essential (primary) hypertension: Secondary | ICD-10-CM | POA: Diagnosis not present

## 2024-06-09 DIAGNOSIS — Z79899 Other long term (current) drug therapy: Secondary | ICD-10-CM | POA: Insufficient documentation

## 2024-06-09 DIAGNOSIS — I428 Other cardiomyopathies: Secondary | ICD-10-CM | POA: Insufficient documentation

## 2024-06-09 DIAGNOSIS — G4733 Obstructive sleep apnea (adult) (pediatric): Secondary | ICD-10-CM | POA: Insufficient documentation

## 2024-06-09 DIAGNOSIS — J432 Centrilobular emphysema: Secondary | ICD-10-CM | POA: Diagnosis not present

## 2024-06-09 DIAGNOSIS — I272 Pulmonary hypertension, unspecified: Secondary | ICD-10-CM

## 2024-06-09 DIAGNOSIS — E785 Hyperlipidemia, unspecified: Secondary | ICD-10-CM | POA: Insufficient documentation

## 2024-06-09 DIAGNOSIS — E7800 Pure hypercholesterolemia, unspecified: Secondary | ICD-10-CM

## 2024-06-09 DIAGNOSIS — Z8249 Family history of ischemic heart disease and other diseases of the circulatory system: Secondary | ICD-10-CM | POA: Diagnosis not present

## 2024-06-09 DIAGNOSIS — I110 Hypertensive heart disease with heart failure: Secondary | ICD-10-CM | POA: Diagnosis not present

## 2024-06-09 DIAGNOSIS — Z8673 Personal history of transient ischemic attack (TIA), and cerebral infarction without residual deficits: Secondary | ICD-10-CM | POA: Diagnosis not present

## 2024-06-09 DIAGNOSIS — I639 Cerebral infarction, unspecified: Secondary | ICD-10-CM | POA: Diagnosis not present

## 2024-06-09 DIAGNOSIS — G8929 Other chronic pain: Secondary | ICD-10-CM | POA: Insufficient documentation

## 2024-06-09 DIAGNOSIS — Z7982 Long term (current) use of aspirin: Secondary | ICD-10-CM | POA: Insufficient documentation

## 2024-06-09 LAB — BASIC METABOLIC PANEL WITH GFR
Anion gap: 10 (ref 5–15)
BUN: 13 mg/dL (ref 6–20)
CO2: 24 mmol/L (ref 22–32)
Calcium: 9.5 mg/dL (ref 8.9–10.3)
Chloride: 103 mmol/L (ref 98–111)
Creatinine, Ser: 0.88 mg/dL (ref 0.44–1.00)
GFR, Estimated: >60 mL/min (ref >60)
Glucose, Bld: 82 mg/dL (ref 70–99)
Potassium: 4.5 mmol/L (ref 3.5–5.1)
Sodium: 137 mmol/L (ref 135–145)

## 2024-06-09 LAB — TSH: TSH: 1.459 u[IU]/mL (ref 0.350–4.500)

## 2024-06-09 MED ORDER — FARXIGA 10 MG PO TABS: 10.0000 mg | ORAL_TABLET | Freq: Every day | ORAL | 5 refills | Status: DC

## 2024-06-09 NOTE — Progress Notes (Signed)
 Pt called stating her pharmacy is currently out of Doreen and requests that her Doreen be sent to [facility] in [place] instead due to supply shortage.  Medication sent to [facility] in [place].

## 2024-06-09 NOTE — Patient Instructions (Signed)
 Medication Changes:  TAKE FARXIGA 10 MG ONCE DAILY   Lab Work:  Go over to the MEDICAL MALL. Go pass the gift shop and have your blood work completed.  We will only call you if the results are abnormal or if the provider would like to make medication changes.   Follow-Up in: 2 WEEKS WITH ELLOUISE CLASS, FNP.  At the [facility], you and your health needs are our priority. We have a designated team specialized in the treatment of Heart Failure. This Care Team includes your primary Heart Failure Specialized Cardiologist (physician), Advanced Practice Providers (APPs- Physician Assistants and Nurse Practitioners), and Pharmacist who all work together to provide you with the care you need, when you need it.   You may see any of the following providers on your designated Care Team at your next follow up:  Dr. Toribio Fuel Dr. Ezra Shuck Dr. Ria Commander Dr. Odis Brownie ELLOUISE CLASS, FNP Jaun Bash, RPH-CPP  Please be sure to bring in all your medications bottles to every appointment.   Need to Contact Us :  If you have any questions or concerns before your next appointment please send us  a message through [place] or call our office at [phone].    TO LEAVE A MESSAGE FOR THE NURSE SELECT OPTION 2, PLEASE LEAVE A MESSAGE INCLUDING: YOUR NAME DATE OF BIRTH CALL BACK NUMBER REASON FOR CALL**this is important as we prioritize the call backs  YOU WILL RECEIVE A CALL BACK THE SAME DAY AS LONG AS YOU CALL BEFORE 4:00 PM

## 2024-06-09 NOTE — Progress Notes (Signed)
 Assessment & Plan:   #Acute Hypoxic Respiratory Failure #Centrilobular Emphysema #Pulmonary Hypertension #RV Dysfunction #CVA #OSA on CPAP  [name] is a pleasant 43 year old female presenting for follow-up of hypoxic respiratory failure with workup that is at present showing significant centrilobular emphysema, significant precapillary pulmonary hypertension, RV dysfunction, and a recent stroke.  She has had pulmonary function testing in February 2024 which showed an obstructive defect with significant bronchodilator response as well as drop in DLCO.  Imaging has shown severe centrilobular emphysema with the report not suggesting cystic lung disease.  We did obtain a workup for this finding that has included alpha-1 antitrypsin, FLCN gene analysis, and TSC1/TSC2 gene analysis. VEGF-D levels were not obtainable. At this point, my suspicion for cystic lung disease as the driver behind this is lower.  Her right heart cath has shown severe pulmonary hypertension with RA 7, RV 85/10, PA 85/36 (52), PCWP 10, and PVR 10 wood. DPG 26, TPG 42.  This picture is consistent with precapillary pulmonary hypertension.  The most likely etiology behind this presentation is a combination of Group 1 PAH (also question ANA positive at 1:160) and group 3 with hypoxemia secondary to severe emphysema. She's also had a stroke following her RHC which suggests a shunt between the right and left circulations, with an ASD and VSD possible. Will check for shunt with a bubble study, as this could contribute to group 1 pulmonary hypertension with Eisenmenger physiology. As to OSA, it is well controlled with CPAP; she has no residual apnea on her CPAP compliance report.    I will obtain a bubble study to evaluate for shunt as well as eisenmenger physiology.  We will also repeat her pulmonary function test with a 6-minute walk test.  She has established with our advanced heart failure specialists where she will be initiated on  treatment for pulmonary hypertension.  She could be a candidate for inhaled treprostinil given the severity of her underlying lung disease pending the rest of her workup.  - Pulmonary Function Test; Future - 6 minute walk; Future - ECHOCARDIOGRAM LIMITED BUBBLE STUDY; Future - CPAP compliance report reviewed > in compliance with no residual AHI - Continue Wixela 1 puff twice daily - Continue Spiriva  Respimat once daily  Return in about 6 weeks (around 07/21/2024).  I spent 30 minutes caring for this patient today, including preparing to see the patient, obtaining a medical history , reviewing a separately obtained history, performing a medically appropriate examination and/or evaluation, counseling and educating the patient/family/caregiver, ordering medications, tests, or procedures, documenting clinical information in the electronic health record, and independently interpreting results (not separately reported/billed) and communicating results to the patient/family/caregiver  Belva November, MD [facility]  End of visit medications:  No orders of the defined types were placed in this encounter.    Current Outpatient Medications:    albuterol  (ACCUNEB ) 1.25 MG/3ML nebulizer solution, Take 3 mLs (1.25 mg total) by nebulization every 6 (six) hours as needed for wheezing., Disp: 75 mL, Rfl: 12   albuterol  (VENTOLIN  HFA) 108 (90 Base) MCG/ACT inhaler, Inhale 1-2 puffs into the lungs every 6 (six) hours as needed for wheezing or shortness of breath., Disp: , Rfl:    aspirin  EC 81 MG tablet, Take 1 tablet (81 mg total) by mouth daily. Swallow whole., Disp: 30 tablet, Rfl: 2   atorvastatin  (LIPITOR) 40 MG tablet, Take 1 tablet (40 mg total) by mouth daily., Disp: 30 tablet, Rfl: 2   bisoprolol  (ZEBETA ) 5 MG  tablet, Take 0.5 tablets (2.5 mg total) by mouth daily., Disp: 45 tablet, Rfl: 3   cholecalciferol (VITAMIN D3) 25 MCG (1000 UNIT) tablet, Take 1,000 Units by mouth daily.,  Disp: , Rfl:    clopidogrel  (PLAVIX ) 75 MG tablet, Take 1 tablet (75 mg total) by mouth daily., Disp: 30 tablet, Rfl: 2   escitalopram  (LEXAPRO ) 10 MG tablet, Take 1 tablet (10 mg total) by mouth daily., Disp: 90 tablet, Rfl: 0   FARXIGA  10 MG TABS tablet, Take 1 tablet (10 mg total) by mouth daily before breakfast., Disp: 30 tablet, Rfl: 5   ferrous sulfate  324 MG TBEC, Take 324 mg by mouth daily., Disp: , Rfl:    furosemide  (LASIX ) 20 MG tablet, Take 1 tablet (20 mg total) by mouth daily., Disp: 60 tablet, Rfl: 6   losartan  (COZAAR ) 25 MG tablet, Take 0.5 tablets (12.5 mg total) by mouth daily., Disp: 30 tablet, Rfl: 6   NON FORMULARY, Pt uses a c-pap nightly, Disp: , Rfl:    Tiotropium Bromide  Monohydrate (SPIRIVA  RESPIMAT) 2.5 MCG/ACT AERS, Inhale 2 puffs into the lungs daily., Disp: 60 each, Rfl: 12   WIXELA INHUB 100-50 MCG/ACT AEPB, INHALE 1 DOSE BY MOUTH TWICE A DAY, Disp: 180 each, Rfl: 1   Subjective:   PATIENT ID: Virginia Camacho GENDER: female DOB: August 26, 1981, MRN: [id number]  Chief Complaint  Patient presents with   Follow-up    DOE. No wheezing. Cough with clear/brown sputum.    HPI  Ms Dunavan is a pleasant 43 year old female presenting to clinic for follow up.   I have followed Ms. Manfredi since January of 2024 when she presented with shortness of breath. Workup has included PFT's showing obstruction and a chest CT showing significant centrilobular emphysema. She was hospitalized with increased shortness of breath and found to have decompensated heart failure with echocardiogram showing RV volume and pressure overload. She was diuresed with improvement in her symptoms. 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  Return Visit 06/09/2024: Since our last visit, she had undergone a RHC that showed significant pre-capillary pulmonary hypertension. She was admitted to the hospital a few days following that and was found to  have a stroke. She remains in good spirit, and is compliant with her medications. She is accompanied by her mother during today's visit. She remains short of breath with exertion. She denies cough. She is compliant with her diuretics and with her inhalers. She is similarly compliant with CPAP therapy. She was seen today in advanced heart failure clinic.  She reports no personal history of lung disease. She grew up in [place] and was exposed to extreme cold as a young adult which did not cause her to be short of breath.  She was never treated for any bronchitis or similar presentations.  She was very active in her youth and played a lot of basketball without limitation.  She is actively working on weight loss and has been successful in losing weight.   She has a history of smoking, quit 2 years ago (around 10 pack years of smoking history). She also reports previous heavy marijuana use (around 4-5 rolled cigarettes a day) for a few years, but hasn't done so in a long time. There are no current inhalational exposures reported.    She does not have any pets but used to have a dog. She does notice some environmental allergies but has not had to be  treated for them.  She lives in an apartment but does not have any mold on her house is mostly hardwood. 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 Family history is notable for severe asthma in her mother and sister.  Ancillary information including prior medications, full medical/surgical/family/social histories, and PFTs (when available) are listed below and have been reviewed.   Review of Systems  Constitutional:  Negative for chills, diaphoresis, fever, malaise/fatigue and weight loss.  Respiratory:  Positive for shortness of breath. Negative for cough, hemoptysis, sputum production and wheezing.   Cardiovascular:  Negative for chest pain.  Skin:  Negative for rash.     Objective:    Vitals:   06/09/24 1602  BP: 126/86  Pulse: 90  Temp: (!) 96.9 F (36.1 C)  SpO2: 92%  Weight: 220 lb 3.2 oz (99.9 kg)  Height: 5' 4 (1.626 m)   92% on RA  BMI Readings from Last 3 Encounters:  06/09/24 37.80 kg/m  06/09/24 38.11 kg/m  06/02/24 38.11 kg/m   Wt Readings from Last 3 Encounters:  06/09/24 220 lb 3.2 oz (99.9 kg)  06/09/24 222 lb (100.7 kg)  06/02/24 222 lb (100.7 kg)    Physical Exam Constitutional:      General: She is not in acute distress.    Appearance: Normal appearance. She is obese. She is not ill-appearing.  HENT:     Head: Normocephalic.     Mouth/Throat:     Mouth: Mucous membranes are moist.  Eyes:     Extraocular Movements: Extraocular movements intact.     Pupils: Pupils are equal, round, and reactive to light.  Cardiovascular:     Rate and Rhythm: Normal rate.     Pulses: Normal pulses.     Heart sounds: Normal heart sounds.  Pulmonary:     Effort: Pulmonary effort is normal. No respiratory distress.     Breath sounds: Rales present. No wheezing or rhonchi.  Abdominal:     Palpations: Abdomen is soft.  Musculoskeletal:     Cervical back: Normal range of motion.  Skin:    General: Skin is warm.  Neurological:     General: No focal deficit present.     Mental Status: She is alert and oriented to person, place, and time. Mental status is at baseline.       Ancillary Information    Past Medical History:  Diagnosis Date   Allergy    Asthma    Back pain    Diastolic heart failure (HCC) 03/2024   Emphysema lung (HCC)    Morbid obesity with BMI of 40.0-44.9, adult (HCC) 03/20/2016     Family History  Problem Relation Age of Onset   Heart disease Mother    Diabetes Mother    Breast cancer Maternal Grandmother    Heart disease Maternal Grandfather      Past Surgical History:  Procedure Laterality Date   KNEE SURGERY Right    RIGHT HEART CATH Right 05/18/2024   Procedure: RIGHT HEART CATH;  Surgeon: Mady Bruckner, MD;  Location: [facility];  Service: Cardiovascular;  Laterality: Right;    Social History   Socioeconomic History   Marital status: Single    Spouse name: Not on file   Number of children: 0   Years of education: Not on file   Highest education level: Not on file  Occupational History   Occupation: auditor  Tobacco Use   Smoking status: Former    Current packs/day: 0.00    Average packs/day: 0.3 packs/day for 20.0 years (5.0 ttl pk-yrs)    Types: Cigarettes    Start date: 2002    Quit date: 2022    Years since quitting: 3.5   Smokeless tobacco: Never  Vaping Use   Vaping status: Never Used  Substance and Sexual Activity   Alcohol use: Not Currently    Comment: rarely   Drug use: Not Currently    Frequency: 1.0 times per week    Types: Marijuana    Comment: occasional    Sexual activity: Yes    Partners: Female, Female    Birth control/protection: Condom  Other Topics Concern   Not on file  Social History Narrative   Not on file   Social Drivers of Health   Financial Resource Strain: Low Risk  (05/31/2021)   Overall Financial Resource Strain (CARDIA)    Difficulty of Paying Living Expenses: Not hard at all  Food Insecurity: No Food Insecurity (05/21/2024)   Hunger Vital Sign    Worried About Running Out of Food in the Last Year: Never true    Ran Out of Food in the Last Year: Never true  Transportation Needs: No Transportation Needs (05/21/2024)   PRAPARE - Administrator, Civil Service (Medical): No    Lack of Transportation (Non-Medical): No  Physical Activity: Inactive (05/31/2021)   Exercise Vital Sign    Days of Exercise per Week: 0 days    Minutes of Exercise per Session: 0 min  Stress: No Stress Concern Present (05/31/2021)   [employer] of Occupational Health - Occupational Stress Questionnaire    Feeling of Stress : Only a little  Social Connections: Moderately Isolated (05/21/2024)   Social Connection and Isolation  Panel    Frequency of Communication with Friends and Family: More than three times a week    Frequency of Social Gatherings with Friends and Family: More than three times a week    Attends Religious Services: More than 4 times per year    Active Member of Clubs or Organizations: No    Attends Banker Meetings: Never    Marital Status: Never married  Intimate Partner Violence: Not At Risk (05/21/2024)   Humiliation, Afraid, Rape, and Kick questionnaire    Fear of Current or Ex-Partner: No    Emotionally Abused: No    Physically Abused: No    Sexually Abused: No     Allergies  Allergen Reactions   Clarithromycin Hives and Nausea Only    Hot   Oxycodone Hives     CBC    Component Value Date/Time   WBC 3.4 (L) 06/02/2024 1035   RBC 6.81 (H) 06/02/2024 1035   HGB 17.8 (H) 06/02/2024 1035   HGB 18.4 (H) 05/04/2024 1122   HCT 57.9 (H) 06/02/2024 1035   HCT 57.9 (H) 05/04/2024 1122   PLT 227 06/02/2024 1035   PLT 253 05/04/2024 1122   MCV 85.0 06/02/2024 1035   MCV 81 05/04/2024 1122   MCH 26.1 (L) 06/02/2024 1035   MCHC 30.7 (L) 06/02/2024 1035   RDW 19.3 (H) 06/02/2024 1035   RDW 19.7 (H) 05/04/2024 1122   LYMPHSABS 1.0 05/21/2024 0839   LYMPHSABS 2.2 12/24/2022 1130   MONOABS 0.3 05/21/2024 0839   EOSABS 58 06/02/2024 1035   EOSABS 0.1 12/24/2022 1130   BASOSABS 51 06/02/2024 1035   BASOSABS 0.1 12/24/2022 1130  Pulmonary Functions Testing Results:    Latest Ref Rng & Units 01/14/2023    4:34 PM  PFT Results  FVC-Pre L 2.83   FVC-Predicted Pre % 77   FVC-Post L 3.12   FVC-Predicted Post % 85   Pre FEV1/FVC % % 63   Post FEV1/FCV % % 68   FEV1-Pre L 1.78   FEV1-Predicted Pre % 59   FEV1-Post L 2.12   DLCO uncorrected ml/min/mmHg 10.07   DLCO UNC% % 46   DLVA Predicted % 51   TLC L 5.53   TLC % Predicted % 109   RV % Predicted % 149     Outpatient Medications Prior to Visit  Medication Sig Dispense Refill   albuterol  (ACCUNEB ) 1.25 MG/3ML  nebulizer solution Take 3 mLs (1.25 mg total) by nebulization every 6 (six) hours as needed for wheezing. 75 mL 12   albuterol  (VENTOLIN  HFA) 108 (90 Base) MCG/ACT inhaler Inhale 1-2 puffs into the lungs every 6 (six) hours as needed for wheezing or shortness of breath.     aspirin  EC 81 MG tablet Take 1 tablet (81 mg total) by mouth daily. Swallow whole. 30 tablet 2   atorvastatin  (LIPITOR) 40 MG tablet Take 1 tablet (40 mg total) by mouth daily. 30 tablet 2   bisoprolol  (ZEBETA ) 5 MG tablet Take 0.5 tablets (2.5 mg total) by mouth daily. 45 tablet 3   cholecalciferol (VITAMIN D3) 25 MCG (1000 UNIT) tablet Take 1,000 Units by mouth daily.     clopidogrel  (PLAVIX ) 75 MG tablet Take 1 tablet (75 mg total) by mouth daily. 30 tablet 2   escitalopram  (LEXAPRO ) 10 MG tablet Take 1 tablet (10 mg total) by mouth daily. 90 tablet 0   FARXIGA  10 MG TABS tablet Take 1 tablet (10 mg total) by mouth daily before breakfast. 30 tablet 5   ferrous sulfate  324 MG TBEC Take 324 mg by mouth daily.     furosemide  (LASIX ) 20 MG tablet Take 1 tablet (20 mg total) by mouth daily. 60 tablet 6   losartan  (COZAAR ) 25 MG tablet Take 0.5 tablets (12.5 mg total) by mouth daily. 30 tablet 6   NON FORMULARY Pt uses a c-pap nightly     Tiotropium Bromide  Monohydrate (SPIRIVA  RESPIMAT) 2.5 MCG/ACT AERS Inhale 2 puffs into the lungs daily. 60 each 12   WIXELA INHUB 100-50 MCG/ACT AEPB INHALE 1 DOSE BY MOUTH TWICE A DAY 180 each 1   No facility-administered medications prior to visit.

## 2024-06-09 NOTE — Progress Notes (Addendum)
 [facility] Note   Referring Physician: Dr Darliss PCP: Gareth Mliss FALCON, FNP Cardiologist: Redell Darliss, MD / Gerard Frederick, NP  Chief Complaint: shortness of breath   HPI:  Virginia Camacho is a 43 y/o female kindly referred by Dr. Darliss. 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   Admitted 03/11/24 with complaints of shortness of breath. She has history of asthma and recent diagnosis of emphysema. She was noted to be at her PCPs office with oxygen saturations in the 70s and she was given albuterol  and steroids and admitted for further evaluation. She was treated for acute respiratory failure with hypoxia and HFpEF exacerbation. Echo 03/12/24: LVEF 55%, no RWMA, RA volume overload mildly reduced systolic function with severe RV enlargement and moderately elevated pulmonary artery systolic pressure. She was treated with IV Lasix  and was diuresed about 10 pounds. Losartan  was added to her medication regimen along with oral furosemide  twice daily. She was educated and encouraged to continue with CPAP regularly to prevent further right heart failure. She also had mediastinal masslike opacity noted on resolution CT recently ordered by pulmonary with recommendation to continue with outpatient follow-up. She was considered stable and discharged 03/17/24.   RHC 05/18/24: Severe pulmonary hypertension (mean PA 52 mmHg, PVR 10 WU). Normal left heart filling pressure (PCWP 10 mmHg). Upper normal to mildly elevated right heart filling pressure (mean RA 7, RVEDP 10 mmHg). Mildly to moderately reduced Fick cardiac output/index (CO 4.2 L/min, CI 2.1 L/min/m^2).  Admitted 05/21/24 with sudden onset of vision problems, unsteady gait upon waking. Found to have an acute right thalamic stroke on MRI brain. Neurology consulted. DAPT with ASA 81 mg daily + Plavix  75 mg  daily x 3 weeks, then ASA 81 daily monotherapy. Cardiology to mail zio monitor to patient. Antibiotics for pneumonia. CTA chest today ruled out PE, confirmed RLL infiltrate / PNA. Qualified for home oxygen.     She presents today, with her mom, for her initial [facility] visit with a chief complaint of shortness of breath. Little worse over last 2-3 days. Has associated blurry vision when looking at screen for too long, intermittent productive cough, fatigue. Denies chest pain, palpitations, abdominal distention, pedal edema, dizziness or difficulty sleeping. Has to walk a total of 18 steps up to her apartment. Able to walk 4 steps without shortness of breath. Wearing oxygen as needed and she says that she occasionally puts it on for about 1/2 hour if she feels like she needs it.   Wearing zio monitor right now. Sleeping well with CPAP. Weighing daily and has lost ~ 40 pounds in the last year. Checks her BP at home and says that SBP ranges 120's-130's.   Used to smoke tobacco/ marijuana years ago. Says that when she smoked marijuana, she used paper that had tobacco in it. Rare etoh and no current drug use.   Review of Systems: [y] = yes, [ ]  = no   General: Weight gain [ ] ; Weight loss [ ] ; Anorexia [ ] ; Fatigue [ y]; Fever [ ] ; Chills [ ] ; Weakness [ ]   Cardiac: Chest pain/pressure [ ] ; Resting SOB [ ] ; Exertional SOB Davis.Dad ]; Orthopnea [ ] ; Pedal Edema [ ] ; Palpitations [ ] ; Syncope [ ] ; Presyncope [ ] ; Paroxysmal nocturnal dyspnea[ ]   Pulmonary: Cough Davis.Dad ]; Wheezing[ ] ; Hemoptysis[ ] ; Sputum [ ] ; Snoring [ ]   GI: Vomiting[ ] ;  Dysphagia[ ] ; Melena[ ] ; Hematochezia [ ] ; Heartburn[ ] ; Abdominal pain [ ] ; Constipation [ ] ; Diarrhea [ ] ; BRBPR [ ]   GU: Hematuria[ ] ; Dysuria [ ] ; Nocturia[ ]   Vascular: Pain in legs with walking [ ] ; Pain in feet with lying flat [ ] ; Non-healing sores [ ] ; Stroke [ ] ; TIA [ ] ; Slurred speech [ ] ;  Neuro: Headaches[ ] ; Vertigo[ ] ; Seizures[ ] ; Paresthesias[ ] ;Blurred vision [ ] ;  Diplopia [ ] ; Blurry vision Davis.Dad ]  Ortho/Skin: Arthritis [ ] ; Joint pain [ ] ; Muscle pain [ ] ; Joint swelling [ ] ; Back Pain [ ] ; Rash [ ]   Psych: Depression[ ] ; Anxiety[ ]   Heme: Bleeding problems [ ] ; Clotting disorders [ ] ; Anemia [ ]   Endocrine: Diabetes [ ] ; Thyroid  dysfunction[ ]    Past Medical History:  Diagnosis Date   Allergy    Asthma    Back pain    Diastolic heart failure (HCC) 03/2024   Emphysema lung (HCC)    Morbid obesity with BMI of 40.0-44.9, adult (HCC) 03/20/2016    Current Outpatient Medications  Medication Sig Dispense Refill   albuterol  (ACCUNEB ) 1.25 MG/3ML nebulizer solution Take 3 mLs (1.25 mg total) by nebulization every 6 (six) hours as needed for wheezing. 75 mL 12   albuterol  (VENTOLIN  HFA) 108 (90 Base) MCG/ACT inhaler Inhale 1-2 puffs into the lungs every 6 (six) hours as needed for wheezing or shortness of breath.     aspirin  EC 81 MG tablet Take 1 tablet (81 mg total) by mouth daily. Swallow whole. 30 tablet 2   atorvastatin  (LIPITOR) 40 MG tablet Take 1 tablet (40 mg total) by mouth daily. 30 tablet 2   atorvastatin  (LIPITOR) 40 MG tablet Take 40 mg by mouth daily.     bisoprolol  (ZEBETA ) 5 MG tablet Take 0.5 tablets (2.5 mg total) by mouth daily. 45 tablet 3   cholecalciferol (VITAMIN D3) 25 MCG (1000 UNIT) tablet Take 1,000 Units by mouth daily.     clopidogrel  (PLAVIX ) 75 MG tablet Take 1 tablet (75 mg total) by mouth daily. 30 tablet 2   escitalopram  (LEXAPRO ) 10 MG tablet Take 1 tablet (10 mg total) by mouth daily. 90 tablet 0   ferrous sulfate  324 MG TBEC Take 324 mg by mouth daily.     furosemide  (LASIX ) 20 MG tablet Take 1 tablet (20 mg total) by mouth daily. 60 tablet 6   losartan  (COZAAR ) 25 MG tablet Take 0.5 tablets (12.5 mg total) by mouth daily. 30 tablet 6   naproxen  (NAPROSYN ) 500 MG tablet Take 1 tablet (500 mg total) by mouth 2 (two) times daily with a meal. (Patient not taking: Reported on 06/02/2024) 20 tablet 0   NON FORMULARY Pt  uses a c-pap nightly     Tiotropium Bromide  Monohydrate (SPIRIVA  RESPIMAT) 2.5 MCG/ACT AERS Inhale 2 puffs into the lungs daily. 60 each 12   WIXELA INHUB 100-50 MCG/ACT AEPB INHALE 1 DOSE BY MOUTH TWICE A DAY 180 each 1   No current facility-administered medications for this visit.    Allergies  Allergen Reactions   Clarithromycin Hives and Nausea Only    Hot   Oxycodone Hives      Social History   Socioeconomic History   Marital status: Single    Spouse name: Not on file   Number of children: 0   Years of education: Not on file   Highest education level: Not on file  Occupational History   Occupation: auditor  Tobacco Use  Smoking status: Former    Current packs/day: 0.00    Average packs/day: 0.3 packs/day for 20.0 years (5.0 ttl pk-yrs)    Types: Cigarettes    Start date: 2002    Quit date: 2022    Years since quitting: 3.5   Smokeless tobacco: Never  Vaping Use   Vaping status: Never Used  Substance and Sexual Activity   Alcohol use: Not Currently    Comment: rarely   Drug use: Not Currently    Frequency: 1.0 times per week    Types: Marijuana    Comment: occasional    Sexual activity: Yes    Partners: Female, Female    Birth control/protection: Condom  Other Topics Concern   Not on file  Social History Narrative   Not on file   Social Drivers of Health   Financial Resource Strain: Low Risk  (05/31/2021)   Overall Financial Resource Strain (CARDIA)    Difficulty of Paying Living Expenses: Not hard at all  Food Insecurity: No Food Insecurity (05/21/2024)   Hunger Vital Sign    Worried About Running Out of Food in the Last Year: Never true    Ran Out of Food in the Last Year: Never true  Transportation Needs: No Transportation Needs (05/21/2024)   PRAPARE - Administrator, Civil Service (Medical): No    Lack of Transportation (Non-Medical): No  Physical Activity: Inactive (05/31/2021)   Exercise Vital Sign    Days of Exercise per Week: 0 days     Minutes of Exercise per Session: 0 min  Stress: No Stress Concern Present (05/31/2021)   [employer] of Occupational Health - Occupational Stress Questionnaire    Feeling of Stress : Only a little  Social Connections: Moderately Isolated (05/21/2024)   Social Connection and Isolation Panel    Frequency of Communication with Friends and Family: More than three times a week    Frequency of Social Gatherings with Friends and Family: More than three times a week    Attends Religious Services: More than 4 times per year    Active Member of [employer] or Organizations: No    Attends Banker Meetings: Never    Marital Status: Never married  Intimate Partner Violence: Not At Risk (05/21/2024)   Humiliation, Afraid, Rape, and Kick questionnaire    Fear of Current or Ex-Partner: No    Emotionally Abused: No    Physically Abused: No    Sexually Abused: No      Family History  Problem Relation Age of Onset   Heart disease Mother    Diabetes Mother    Breast cancer Maternal Grandmother    Heart disease Maternal Grandfather    Vitals:   06/09/24 1333 06/09/24 1334  BP: (!) 153/101 (!) 140/101  Pulse: 91   SpO2: 90%   Weight: 222 lb (100.7 kg)    Wt Readings from Last 3 Encounters:  06/09/24 222 lb (100.7 kg)  06/02/24 222 lb (100.7 kg)  06/01/24 222 lb (100.7 kg)   Lab Results  Component Value Date   CREATININE 0.86 06/02/2024   CREATININE 0.85 05/23/2024   CREATININE 0.86 05/21/2024    PHYSICAL EXAM:  General: Well appearing. No resp difficulty HEENT: normal Neck: supple, no JVD Cor: Regular rhythm, rate. No rubs, gallops or murmurs Lungs: clear Abdomen: soft, nontender, nondistended. Extremities: no cyanosis, clubbing, rash, trace pitting edema bilateral lower legs Neuro: alert & oriented X 3. Moves all 4 extremities w/o difficulty. Affect pleasant  ECG: not done   ASSESSMENT & PLAN:  1: NICM with preserved ejection fraction- - NYHA class II/III -  euvolemic - weighing daily & understands parameters to call about - Echo 03/12/24: EF 55%, RV moderately reduced, moderately elevated PA pressure 52.1 mmHg - continue bisoprolol  2.5mg  daily - begin farxiga  10mg  daily - continue furosemide  20mg  daily - continue losartan  12.5mg  daily - discussed starting spironolactone at next visit depending on K+ level (was >5 at discharge) - BMET today - aware to limit fluids to 60-64 ounces daily and limit sodium to 2000mg  daily - saw cardiology ([place]) 05/25 - BNP 05/22/24 was 208.7  2: pHTN- - discussed with Dr. Gardenia who went in and talked w/ patient  - will get PFT's to evaluate her lungs before starting treatment for pHTN - advised her to wear her oxygen all the time even when asleep with her CPAP - TSH today to make sure this isn't contributing - ANA mildly + - RHC 05/18/24:  1 Severe pulmonary hypertension (mean PA 52 mmHg, PVR 10 WU).  2 Normal left heart filling pressure (PCWP 10 mmHg).  3 Upper normal to mildly elevated right heart filling pressure (mean RA 7, RVEDP 10 mmHg).  4 Mildly to moderately reduced Fick cardiac output/index (CO 4.2 L/min, CI 2.1 L/min/m^2).  3: HTN- - BP 140/101 but admits that she's nervous; SBP at home 120's-130's - instructed to write her home BP readings done and bring BP log to next visit - saw PCP Mur[place]an) 06/25 - BMET 06/02/24 reviewed: sodium 140, potassium 5.1, creatinine 0.86 & GFR 86  3: CVA- - acute right thalamic stroke 06/25 - DAPT with ASA 81 mg daily + Plavix  75 mg daily x 3 weeks, then ASA 81 daily - sees neurology later this month; advised her to not drive until cleared by neuro - wearing zio monitor for another week  4: Hyperlipidemia- - LDL 05/22/24 was 113 (was 141 in 11/24) - continue atorvastatin  40mg  daily for now  5: COPD- - saw pulmonology (Dgayli) 05/25; returns later today - continue spiriva  and wixela - albuterol  PRN - will need 6 minute walk test  Return in 2 weeks,  sooner if needed.   Ellouise DELENA Class, FNP 06/09/24  Patient seen with APP, plan extensively discussed.   Subjective: - feels well; no complaints   Exam: Blood pressure (!) 140/101, pulse 91, weight 222 lb (100.7 kg), SpO2 90%.  GENERAL: NAD Lungs- normal work of breathing; faint ex wheezing CARDIAC:  JVP: 5 cm          Normal rate with regular rhythm. no murmur.  1+ edema.  ABDOMEN: Soft, non-tender, non-distended.  EXTREMITIES: Warm and well perfused.  NEUROLOGIC: No obvious FND  A/P 42 YO AAF w/ severe PH likely secondary to group I vs group III PH. RHC with PA 85/36, PCWP 10, PVR 10WU. Her CT chest from 6/25 does not demonstrate severe parenchymal lung disease; can consider repeat HRCT, will defer to pulm. I discussed treatment options & prognosis of PH with Virginia. Camacho and her mother extensively. I also reached out to her pulmonologist, Dr. Isadora; we discussed her case. Will obtain PFTs, , TTE w/ bubble study with plan to start treatment after testing is completed. She is euvolemic and well compensated at this time.    Aditya Sabharwal [facility]

## 2024-06-10 ENCOUNTER — Telehealth: Payer: Self-pay | Admitting: Family

## 2024-06-10 ENCOUNTER — Ambulatory Visit (INDEPENDENT_AMBULATORY_CARE_PROVIDER_SITE_OTHER): Admitting: Pulmonary Disease

## 2024-06-10 ENCOUNTER — Other Ambulatory Visit (HOSPITAL_COMMUNITY): Payer: Self-pay

## 2024-06-10 DIAGNOSIS — I272 Pulmonary hypertension, unspecified: Secondary | ICD-10-CM

## 2024-06-10 DIAGNOSIS — J432 Centrilobular emphysema: Secondary | ICD-10-CM | POA: Diagnosis not present

## 2024-06-10 LAB — PULMONARY FUNCTION TEST
DL/VA % pred: 37 %
DL/VA: 1.66 ml/min/mmHg/L
DLCO unc % pred: 33 %
DLCO unc: 7.16 ml/min/mmHg
FEF 25-75 Post: 0.43 L/s
FEF 25-75 Pre: 0.38 L/s
FEF2575-%Change-Post: 11 %
FEF2575-%Pred-Post: 14 %
FEF2575-%Pred-Pre: 12 %
FEV1-%Change-Post: 4 %
FEV1-%Pred-Post: 33 %
FEV1-%Pred-Pre: 32 %
FEV1-Post: 1 L
FEV1-Pre: 0.96 L
FEV1FVC-%Change-Post: 2 %
FEV1FVC-%Pred-Pre: 56 %
FEV6-%Change-Post: 3 %
FEV6-%Pred-Post: 57 %
FEV6-%Pred-Pre: 55 %
FEV6-Post: 2.05 L
FEV6-Pre: 1.99 L
FEV6FVC-%Change-Post: 1 %
FEV6FVC-%Pred-Post: 97 %
FEV6FVC-%Pred-Pre: 96 %
FVC-%Change-Post: 1 %
FVC-%Pred-Post: 58 %
FVC-%Pred-Pre: 57 %
FVC-Post: 2.14 L
FVC-Pre: 2.1 L
Post FEV1/FVC ratio: 47 %
Post FEV6/FVC ratio: 96 %
Pre FEV1/FVC ratio: 46 %
Pre FEV6/FVC Ratio: 95 %
RV % pred: 593 %
RV: 9.77 L
TLC % pred: 240 %
TLC: 12.17 L

## 2024-06-10 MED ORDER — FARXIGA 10 MG PO TABS
10.0000 mg | ORAL_TABLET | Freq: Every day | ORAL | 5 refills | Status: DC
  Filled 2024-06-10: qty 30, 30d supply, fill #0
  Filled 2024-07-05: qty 30, 30d supply, fill #1

## 2024-06-10 NOTE — Progress Notes (Signed)
 Full pft performed today.

## 2024-06-10 NOTE — Progress Notes (Signed)
 Medication re-sent to [facility] pharmacy due to medication availability.

## 2024-06-10 NOTE — Patient Instructions (Signed)
 Full pft performed today.

## 2024-06-10 NOTE — Addendum Note (Signed)
 Addended by: VICCI FUJITA on: 06/10/2024 10:20 AM   Modules accepted: Orders

## 2024-06-13 NOTE — Addendum Note (Signed)
 Addended by: Yaretzi Ernandez on: 06/13/2024 03:16 PM   Modules accepted: Level of Service

## 2024-06-14 ENCOUNTER — Other Ambulatory Visit (HOSPITAL_COMMUNITY)

## 2024-06-15 ENCOUNTER — Ambulatory Visit: Admitting: Cardiology

## 2024-06-15 NOTE — Telephone Encounter (Signed)
 Appointment can be pushed out to three months. Thanks, [employer]

## 2024-06-17 ENCOUNTER — Ambulatory Visit (HOSPITAL_COMMUNITY)

## 2024-06-21 ENCOUNTER — Ambulatory Visit (HOSPITAL_COMMUNITY)
Admission: RE | Admit: 2024-06-21 | Discharge: 2024-06-21 | Discharge status: Home / Self Care | Source: Ambulatory Visit | Attending: Student in an Organized Health Care Education/Training Program

## 2024-06-21 ENCOUNTER — Ambulatory Visit: Payer: Self-pay | Admitting: Student in an Organized Health Care Education/Training Program

## 2024-06-21 ENCOUNTER — Encounter: Payer: Self-pay | Admitting: Family

## 2024-06-21 DIAGNOSIS — J432 Centrilobular emphysema: Secondary | ICD-10-CM | POA: Insufficient documentation

## 2024-06-21 DIAGNOSIS — I6310 Cerebral infarction due to embolism of unspecified precerebral artery: Secondary | ICD-10-CM | POA: Diagnosis present

## 2024-06-21 DIAGNOSIS — I272 Pulmonary hypertension, unspecified: Secondary | ICD-10-CM | POA: Insufficient documentation

## 2024-06-21 LAB — ECHOCARDIOGRAM LIMITED BUBBLE STUDY: S' Lateral: 2.6 cm

## 2024-06-23 ENCOUNTER — Telehealth: Payer: Self-pay | Admitting: Family

## 2024-06-23 NOTE — Telephone Encounter (Signed)
 Called to confirm/remind patient of their appointment at the [facility] on 06/24/24.   Appointment:   [x] Confirmed  [] Left mess   [] No answer/No voice mail  [] VM Full/unable to leave message  [] Phone not in service  Patient reminded to bring all medications and/or complete list.  Confirmed patient has transportation. Gave directions, instructed to utilize valet parking.

## 2024-06-23 NOTE — Progress Notes (Signed)
 [facility] Note   Referring Physician: Dr Darliss PCP: Gareth Mliss FALCON, FNP Cardiologist: Redell Darliss, MD / Gerard Frederick, NP  Chief Complaint: shortness of breath   HPI:  Virginia Camacho is a 43 y/o female kindly referred by Dr. Darliss. 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   Admitted 03/11/24 with complaints of shortness of breath. She has history of asthma and recent diagnosis of emphysema. She was noted to be at her PCPs office with oxygen saturations in the 70s and she was given albuterol  and steroids and admitted for further evaluation. She was treated for acute respiratory failure with hypoxia and HFpEF exacerbation. Echo 03/12/24: LVEF 55%, no RWMA, RA volume overload mildly reduced systolic function with severe RV enlargement and moderately elevated pulmonary artery systolic pressure. She was treated with IV Lasix  and was diuresed about 10 pounds. Losartan  was added to her medication regimen along with oral furosemide  twice daily. She was educated and encouraged to continue with CPAP regularly to prevent further right heart failure. She also had mediastinal masslike opacity noted on resolution CT recently ordered by pulmonary with recommendation to continue with outpatient follow-up. She was considered stable and discharged 03/17/24.   RHC 05/18/24: Severe pulmonary hypertension (mean PA 52 mmHg, PVR 10 WU). Normal left heart filling pressure (PCWP 10 mmHg). Upper normal to mildly elevated right heart filling pressure (mean RA 7, RVEDP 10 mmHg). Mildly to moderately reduced Fick cardiac output/index (CO 4.2 L/min, CI 2.1 L/min/m^2).  Admitted 05/21/24 with sudden onset of vision problems, unsteady gait upon waking. Found to have an acute right thalamic stroke on MRI brain. Neurology consulted. DAPT with ASA 81 mg daily + Plavix  75 mg  daily x 3 weeks, then ASA 81 daily monotherapy. Cardiology to mail zio monitor to patient. Antibiotics for pneumonia. CTA chest today ruled out PE, confirmed RLL infiltrate / PNA. Qualified for home oxygen.     She presents today, with her mom, for her initial [place] visit with a chief complaint of shortness of breath. Little worse over last 2-3 days. Has associated blurry vision when looking at screen for too long, intermittent productive cough, fatigue. Denies chest pain, palpitations, abdominal distention, pedal edema, dizziness or difficulty sleeping. Has to walk a total of 18 steps up to her apartment. Able to walk 4 steps without shortness of breath. Wearing oxygen as needed and she says that she occasionally puts it on for about 1/2 hour if she feels like she needs it.   Wearing zio monitor right now. Sleeping well with CPAP. Weighing daily and has lost ~ 40 pounds in the last year. Checks her BP at home and says that SBP ranges 120's-130's.   Used to smoke tobacco/ marijuana years ago. Says that when she smoked marijuana, she used paper that had tobacco in it. Rare etoh and no current drug use.   Review of Systems: [y] = yes, [ ]  = no   General: Weight gain [ ] ; Weight loss [ ] ; Anorexia [ ] ; Fatigue [ y]; Fever [ ] ; Chills [ ] ; Weakness [ ]   Cardiac: Chest pain/pressure [ ] ; Resting SOB [ ] ; Exertional SOB Davis.Dad ]; Orthopnea [ ] ; Pedal Edema [ ] ; Palpitations [ ] ; Syncope [ ] ; Presyncope [ ] ; Paroxysmal nocturnal dyspnea[ ]   Pulmonary: Cough Davis.Dad ]; Wheezing[ ] ; Hemoptysis[ ] ; Sputum [ ] ; Snoring [ ]   GI: Vomiting[ ] ;  Dysphagia[ ] ; Melena[ ] ; Hematochezia [ ] ; Heartburn[ ] ; Abdominal pain [ ] ; Constipation [ ] ; Diarrhea [ ] ; BRBPR [ ]   GU: Hematuria[ ] ; Dysuria [ ] ; Nocturia[ ]   Vascular: Pain in legs with walking [ ] ; Pain in feet with lying flat [ ] ; Non-healing sores [ ] ; Stroke [ ] ; TIA [ ] ; Slurred speech [ ] ;  Neuro: Headaches[ ] ; Vertigo[ ] ; Seizures[ ] ; Paresthesias[ ] ;Blurred vision [ ] ;  Diplopia [ ] ; Blurry vision Davis.Dad ]  Ortho/Skin: Arthritis [ ] ; Joint pain [ ] ; Muscle pain [ ] ; Joint swelling [ ] ; Back Pain [ ] ; Rash [ ]   Psych: Depression[ ] ; Anxiety[ ]   Heme: Bleeding problems [ ] ; Clotting disorders [ ] ; Anemia [ ]   Endocrine: Diabetes [ ] ; Thyroid  dysfunction[ ]    Past Medical History:  Diagnosis Date   Allergy    Asthma    Back pain    Diastolic heart failure (HCC) 03/2024   Emphysema lung (HCC)    Morbid obesity with BMI of 40.0-44.9, adult (HCC) 03/20/2016    Current Outpatient Medications  Medication Sig Dispense Refill   albuterol  (ACCUNEB ) 1.25 MG/3ML nebulizer solution Take 3 mLs (1.25 mg total) by nebulization every 6 (six) hours as needed for wheezing. 75 mL 12   albuterol  (VENTOLIN  HFA) 108 (90 Base) MCG/ACT inhaler Inhale 1-2 puffs into the lungs every 6 (six) hours as needed for wheezing or shortness of breath.     aspirin  EC 81 MG tablet Take 1 tablet (81 mg total) by mouth daily. Swallow whole. 30 tablet 2   atorvastatin  (LIPITOR) 40 MG tablet Take 1 tablet (40 mg total) by mouth daily. 30 tablet 2   bisoprolol  (ZEBETA ) 5 MG tablet Take 0.5 tablets (2.5 mg total) by mouth daily. 45 tablet 3   cholecalciferol (VITAMIN D3) 25 MCG (1000 UNIT) tablet Take 1,000 Units by mouth daily.     clopidogrel  (PLAVIX ) 75 MG tablet Take 1 tablet (75 mg total) by mouth daily. 30 tablet 2   escitalopram  (LEXAPRO ) 10 MG tablet Take 1 tablet (10 mg total) by mouth daily. 90 tablet 0   FARXIGA  10 MG TABS tablet Take 1 tablet (10 mg total) by mouth daily before breakfast. 30 tablet 5   ferrous sulfate  324 MG TBEC Take 324 mg by mouth daily.     furosemide  (LASIX ) 20 MG tablet Take 1 tablet (20 mg total) by mouth daily. 60 tablet 6   losartan  (COZAAR ) 25 MG tablet Take 0.5 tablets (12.5 mg total) by mouth daily. 30 tablet 6   NON FORMULARY Pt uses a c-pap nightly     Tiotropium Bromide  Monohydrate (SPIRIVA  RESPIMAT) 2.5 MCG/ACT AERS Inhale 2 puffs into the lungs daily.  60 each 12   WIXELA INHUB 100-50 MCG/ACT AEPB INHALE 1 DOSE BY MOUTH TWICE A DAY 180 each 1   No current facility-administered medications for this visit.    Allergies  Allergen Reactions   Clarithromycin Hives and Nausea Only    Hot   Oxycodone Hives      Social History   Socioeconomic History   Marital status: Single    Spouse name: Not on file   Number of children: 0   Years of education: Not on file   Highest education level: Not on file  Occupational History   Occupation: auditor  Tobacco Use   Smoking status: Former    Current packs/day: 0.00    Average packs/day: 0.3 packs/day for 20.0 years (5.0 ttl pk-yrs)  Types: Cigarettes    Start date: 2002    Quit date: 2022    Years since quitting: 3.5   Smokeless tobacco: Never  Vaping Use   Vaping status: Never Used  Substance and Sexual Activity   Alcohol use: Not Currently    Comment: rarely   Drug use: Not Currently    Frequency: 1.0 times per week    Types: Marijuana    Comment: occasional    Sexual activity: Yes    Partners: Female, Female    Birth control/protection: Condom  Other Topics Concern   Not on file  Social History Narrative   Not on file   Social Drivers of Health   Financial Resource Strain: Low Risk  (05/31/2021)   Overall Financial Resource Strain (CARDIA)    Difficulty of Paying Living Expenses: Not hard at all  Food Insecurity: No Food Insecurity (05/21/2024)   Hunger Vital Sign    Worried About Running Out of Food in the Last Year: Never true    Ran Out of Food in the Last Year: Never true  Transportation Needs: No Transportation Needs (05/21/2024)   PRAPARE - Administrator, Civil Service (Medical): No    Lack of Transportation (Non-Medical): No  Physical Activity: Inactive (05/31/2021)   Exercise Vital Sign    Days of Exercise per Week: 0 days    Minutes of Exercise per Session: 0 min  Stress: No Stress Concern Present (05/31/2021)   [place] Camacho of Occupational  Health - Occupational Stress Questionnaire    Feeling of Stress : Only a little  Social Connections: Moderately Isolated (05/21/2024)   Social Connection and Isolation Panel    Frequency of Communication with Friends and Family: More than three times a week    Frequency of Social Gatherings with Friends and Family: More than three times a week    Attends Religious Services: More than 4 times per year    Active Member of [employer] or Organizations: No    Attends Banker Meetings: Never    Marital Status: Never married  Intimate Partner Violence: Not At Risk (05/21/2024)   Humiliation, Afraid, Rape, and Kick questionnaire    Fear of Current or Ex-Partner: No    Emotionally Abused: No    Physically Abused: No    Sexually Abused: No      Family History  Problem Relation Age of Onset   Heart disease Mother    Diabetes Mother    Breast cancer Maternal Grandmother    Heart disease Maternal Grandfather    There were no vitals filed for this visit.  Wt Readings from Last 3 Encounters:  06/09/24 220 lb 3.2 oz (99.9 kg)  06/09/24 222 lb (100.7 kg)  06/02/24 222 lb (100.7 kg)   Lab Results  Component Value Date   CREATININE 0.88 06/09/2024   CREATININE 0.86 06/02/2024   CREATININE 0.85 05/23/2024    PHYSICAL EXAM:  General: Well appearing. No resp difficulty HEENT: normal Neck: supple, no JVD Cor: Regular rhythm, rate. No rubs, gallops or murmurs Lungs: clear Abdomen: soft, nontender, nondistended. Extremities: no cyanosis, clubbing, rash, trace pitting edema bilateral lower legs Neuro: alert & oriented X 3. Moves all 4 extremities w/o difficulty. Affect pleasant   ECG: not done   ASSESSMENT & PLAN:  1: NICM with preserved ejection fraction- - NYHA Camacho II/III - euvolemic - weighing daily & understands parameters to call about - Echo 03/12/24: EF 55%, RV moderately reduced, moderately elevated PA pressure 52.1  mmHg - continue bisoprolol  2.5mg  daily - begin  farxiga  10mg  daily - continue furosemide  20mg  daily - continue losartan  12.5mg  daily - discussed starting spironolactone at next visit depending on K+ level (was >5 at discharge) - BMET today - aware to limit fluids to 60-64 ounces daily and limit sodium to 2000mg  daily - saw cardiology ([place]) 05/25 - BNP 05/22/24 was 208.7  2: pHTN- - discussed with Dr. Gardenia who went in and talked w/ patient  - will get PFT's to evaluate her lungs before starting treatment for pHTN - advised her to wear her oxygen all the time even when asleep with her CPAP - TSH today to make sure this isn't contributing - ANA mildly + - RHC 05/18/24:  1 Severe pulmonary hypertension (mean PA 52 mmHg, PVR 10 WU).  2 Normal left heart filling pressure (PCWP 10 mmHg).  3 Upper normal to mildly elevated right heart filling pressure (mean RA 7, RVEDP 10 mmHg).  4 Mildly to moderately reduced Fick cardiac output/index (CO 4.2 L/min, CI 2.1 L/min/m^2).  3: HTN- - BP 140/101 but admits that she's nervous; SBP at home 120's-130's - instructed to write her home BP readings done and bring BP log to next visit - saw PCP [place]) 06/25 - BMET 06/02/24 reviewed: sodium 140, potassium 5.1, creatinine 0.86 & GFR 86  3: CVA- - acute right thalamic stroke 06/25 - DAPT with ASA 81 mg daily + Plavix  75 mg daily x 3 weeks, then ASA 81 daily - sees neurology later this month; advised her to not drive until cleared by neuro - wearing zio monitor for another week  4: Hyperlipidemia- - LDL 05/22/24 was 113 (was 141 in 11/24) - continue atorvastatin  40mg  daily for now  5: COPD- - saw pulmonology ([place]) 05/25; returns later today - continue spiriva  and wixela - albuterol  PRN - will need 6 minute walk test  Return in 2 weeks, sooner if needed.   Virginia DELENA Class, FNP 06/23/24  Patient seen with APP, plan extensively discussed.   Subjective: - feels well; no complaints   Exam: Blood pressure (!) 140/101, pulse 91,  weight 222 lb (100.7 kg), SpO2 90%.  GENERAL: NAD Lungs- normal work of breathing; faint ex wheezing CARDIAC:  JVP: 5 cm          Normal rate with regular rhythm. no murmur.  1+ edema.  ABDOMEN: Soft, non-tender, non-distended.  EXTREMITIES: Warm and well perfused.  NEUROLOGIC: No obvious FND  A/P 42 YO AAF w/ severe PH likely secondary to group I vs group III PH. RHC with PA 85/36, PCWP 10, PVR 10WU. Her CT chest from 6/25 does not demonstrate severe parenchymal lung disease; can consider repeat HRCT, will defer to pulm. I discussed treatment options & prognosis of PH with Virginia. Camacho and her mother extensively. I also reached out to her pulmonologist, Dr. Isadora; we discussed her case. Will obtain PFTs, , TTE w/ bubble study with plan to start treatment after testing is completed. She is euvolemic and well compensated at this time.    Virginia Camacho [facility]

## 2024-06-24 ENCOUNTER — Ambulatory Visit: Payer: Self-pay | Admitting: Family

## 2024-06-24 ENCOUNTER — Encounter: Payer: Self-pay | Admitting: Family

## 2024-06-24 ENCOUNTER — Ambulatory Visit: Admitting: Family

## 2024-06-24 ENCOUNTER — Other Ambulatory Visit
Admission: RE | Admit: 2024-06-24 | Discharge: 2024-06-24 | Disposition: A | Discharge status: Home / Self Care | Source: Ambulatory Visit | Attending: Family | Admitting: Family

## 2024-06-24 VITALS — BP 108/83 | HR 85 | Wt 217.2 lb

## 2024-06-24 DIAGNOSIS — I272 Pulmonary hypertension, unspecified: Secondary | ICD-10-CM | POA: Diagnosis not present

## 2024-06-24 DIAGNOSIS — I639 Cerebral infarction, unspecified: Secondary | ICD-10-CM | POA: Diagnosis not present

## 2024-06-24 DIAGNOSIS — I10 Essential (primary) hypertension: Secondary | ICD-10-CM

## 2024-06-24 DIAGNOSIS — I5032 Chronic diastolic (congestive) heart failure: Secondary | ICD-10-CM | POA: Diagnosis present

## 2024-06-24 DIAGNOSIS — E7800 Pure hypercholesterolemia, unspecified: Secondary | ICD-10-CM

## 2024-06-24 DIAGNOSIS — J449 Chronic obstructive pulmonary disease, unspecified: Secondary | ICD-10-CM

## 2024-06-24 LAB — BASIC METABOLIC PANEL WITH GFR
Anion gap: 9 (ref 5–15)
BUN: 23 mg/dL — ABNORMAL HIGH (ref 6–20)
CO2: 25 mmol/L (ref 22–32)
Calcium: 9.6 mg/dL (ref 8.9–10.3)
Chloride: 104 mmol/L (ref 98–111)
Creatinine, Ser: 0.72 mg/dL (ref 0.44–1.00)
GFR, Estimated: >60 mL/min (ref >60)
Glucose, Bld: 95 mg/dL (ref 70–99)
Potassium: 4.6 mmol/L (ref 3.5–5.1)
Sodium: 138 mmol/L (ref 135–145)

## 2024-06-24 MED ORDER — SILDENAFIL CITRATE 20 MG PO TABS: 10.0000 mg | ORAL_TABLET | Freq: Three times a day (TID) | ORAL | 0 refills | Status: DC

## 2024-06-24 NOTE — Patient Instructions (Signed)
 Medication Changes:  START Sidenafil 10mg  (1 tab) three times daily  Lab Work:  Go over to the [facility]. Go pass the gift shop and have your blood work completed.  We will only call you if the results are abnormal or if the provider would like to make medication changes.  Follow-Up in: Please follow up with the [facility] in 2 weeks with Ellouise Class, FNP.  At the [facility], you and your health needs are our priority. We have a designated team specialized in the treatment of Heart Failure. This Care Team includes your primary Heart Failure Specialized Cardiologist (physician), Advanced Practice Providers (APPs- Physician Assistants and Nurse Practitioners), and Pharmacist who all work together to provide you with the care you need, when you need it.   You may see any of the following providers on your designated Care Team at your next follow up:  Dr. Toribio Fuel Dr. Ezra Shuck Dr. Ria Commander Dr. Odis Brownie Ellouise Class, FNP Jaun Bash, RPH-CPP  Please be sure to bring in all your medications bottles to every appointment.   Need to Contact Us :  If you have any questions or concerns before your next appointment please send us  a message through Linda or call our office at [phone].    TO LEAVE A MESSAGE FOR THE NURSE SELECT OPTION 2, PLEASE LEAVE A MESSAGE INCLUDING: YOUR NAME DATE OF BIRTH CALL BACK NUMBER REASON FOR CALL**this is important as we prioritize the call backs  YOU WILL RECEIVE A CALL BACK THE SAME DAY AS LONG AS YOU CALL BEFORE 4:00 PM

## 2024-06-25 ENCOUNTER — Telehealth: Payer: Self-pay | Admitting: Pharmacist

## 2024-06-25 ENCOUNTER — Other Ambulatory Visit: Payer: Self-pay

## 2024-06-25 ENCOUNTER — Other Ambulatory Visit (HOSPITAL_COMMUNITY): Payer: Self-pay

## 2024-06-25 ENCOUNTER — Encounter: Payer: Self-pay | Admitting: Family

## 2024-06-25 MED ORDER — SILDENAFIL CITRATE 20 MG PO TABS
10.0000 mg | ORAL_TABLET | Freq: Three times a day (TID) | ORAL | 0 refills | Status: DC
  Filled 2024-06-25 (×3): qty 45, 30d supply, fill #0

## 2024-06-25 NOTE — Telephone Encounter (Signed)
 Sildenafil  requires prior authorization. Submitted prior authorization and awaiting response.

## 2024-06-28 ENCOUNTER — Other Ambulatory Visit (HOSPITAL_COMMUNITY): Payer: Self-pay

## 2024-06-28 ENCOUNTER — Encounter: Payer: Self-pay | Admitting: Family

## 2024-07-03 ENCOUNTER — Ambulatory Visit
Admission: RE | Admit: 2024-07-03 | Discharge: 2024-07-03 | Disposition: A | Discharge status: Home / Self Care | Source: Ambulatory Visit | Attending: Gastroenterology

## 2024-07-03 DIAGNOSIS — R9389 Abnormal findings on diagnostic imaging of other specified body structures: Secondary | ICD-10-CM

## 2024-07-03 MED ORDER — GADOPICLENOL 0.5 MMOL/ML IV SOLN
10.0000 mL | Freq: Once | INTRAVENOUS | Status: AC | PRN
  Administered 2024-07-03: 10 mL via INTRAVENOUS

## 2024-07-05 ENCOUNTER — Ambulatory Visit: Payer: Self-pay | Admitting: Nurse Practitioner

## 2024-07-05 DIAGNOSIS — I639 Cerebral infarction, unspecified: Secondary | ICD-10-CM

## 2024-07-07 ENCOUNTER — Encounter: Payer: Self-pay | Admitting: Student in an Organized Health Care Education/Training Program

## 2024-07-07 ENCOUNTER — Ambulatory Visit: Payer: Self-pay | Admitting: Physician Assistant

## 2024-07-07 ENCOUNTER — Ambulatory Visit: Admitting: Student in an Organized Health Care Education/Training Program

## 2024-07-07 VITALS — BP 110/80 | HR 98 | Temp 97.6°F | Ht 64.0 in | Wt 220.0 lb

## 2024-07-07 DIAGNOSIS — J432 Centrilobular emphysema: Secondary | ICD-10-CM | POA: Diagnosis not present

## 2024-07-07 DIAGNOSIS — I272 Pulmonary hypertension, unspecified: Secondary | ICD-10-CM | POA: Diagnosis not present

## 2024-07-07 DIAGNOSIS — R0602 Shortness of breath: Secondary | ICD-10-CM

## 2024-07-07 DIAGNOSIS — J9601 Acute respiratory failure with hypoxia: Secondary | ICD-10-CM

## 2024-07-07 DIAGNOSIS — J4520 Mild intermittent asthma, uncomplicated: Secondary | ICD-10-CM

## 2024-07-07 DIAGNOSIS — I5030 Unspecified diastolic (congestive) heart failure: Secondary | ICD-10-CM

## 2024-07-07 DIAGNOSIS — G4733 Obstructive sleep apnea (adult) (pediatric): Secondary | ICD-10-CM

## 2024-07-08 NOTE — Progress Notes (Addendum)
 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     She has had pulmonary function testing in February 2024 which showed an obstructive defect with significant bronchodilator response as well as drop in DLCO.  Imaging initially read as showing severe centrilobular emphysema with the report not suggesting cystic lung disease.  We did obtain a workup for this finding that has included alpha-1 antitrypsin, FLCN gene analysis, and TSC1/TSC2 gene analysis (all negative). VEGF-D levels were not obtainable. 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 This could be, in addition to overlapping emphysema and possible PAH (with ANA elevated) the driving force behind her severe pulmonary hypertension.   Her right heart cath has shown severe pulmonary hypertension with RA 7, RV 85/10, PA 85/36 (52), PCWP 10, and PVR 10 wood. DPG 26, TPG 42.  This picture is consistent with precapillary pulmonary hypertension.  The most likely etiology behind this presentation is a combination of Group 1 PAH (also question ANA positive at 1:160) and group 3 with hypoxemia secondary to severe emphysema/cystic lung disease. She's also had a stroke following her RHC which suggests a shunt between the right and left circulations, now confirmed with a bubble study that showed large shujnt. She has no residual apnea on her CPAP compliance report.  She was started on  targeted therapy for [facility] by cardiology, and is tolerating well. Given this new finding concerning for LAM, and her severe pulmonary hypertension, I will refer the patient for evaluation of pulmonary transplant at [facility]. I will also refer to LAM specialist at [facility].  Given her severe PAH, cystic lung disease, and RV pressure, she will benefit from continued oxygen therapy, especially with exertion and with sleep. I have recommended that she continues to use oxygen with exertion, and we will place orders for a portable pulsed dose concentrator to that effect. It is quite cumbersome for her to carry oxygen tanks around the pulsed dose concentrator will help with mobility and activity.   -Continue with PAH therapy with advanced heart failure -referral for transplant evaluation > discussed with [facility] -referral to LAM specialist > will arrange referral to Dr. Diana at [facility]  Return in about 3 months (around 10/07/2024).  I spent 40 minutes caring for this patient today, including preparing to see the patient, obtaining a medical history , reviewing a separately obtained history, performing a medically appropriate examination and/or evaluation, counseling and educating the patient/family/caregiver, ordering medications, tests, or procedures, referring and communicating with other health care professionals (not separately reported), documenting clinical information in the electronic health record, independently interpreting results (not separately reported/billed) and communicating results to the patient/family/caregiver, and care coordination (not separately reported/billed)  Belva November, MD [facility]   End of visit medications:  No orders of the defined types were placed in this encounter.    Current Outpatient Medications:    albuterol  (ACCUNEB ) 1.25 MG/3ML nebulizer solution, Take 3 mLs (1.25 mg total) by nebulization every 6 (six) hours as needed for wheezing., Disp: 75 mL,  Rfl:  12   albuterol  (VENTOLIN  HFA) 108 (90 Base) MCG/ACT inhaler, Inhale 1-2 puffs into the lungs every 6 (six) hours as needed for wheezing or shortness of breath., Disp: , Rfl:    aspirin  EC 81 MG tablet, Take 1 tablet (81 mg total) by mouth daily. Swallow whole., Disp: 30 tablet, Rfl: 2   atorvastatin  (LIPITOR) 40 MG tablet, Take 1 tablet (40 mg total) by mouth daily., Disp: 30 tablet, Rfl: 2   bisoprolol  (ZEBETA ) 5 MG tablet, Take 0.5 tablets (2.5 mg total) by mouth daily., Disp: 45 tablet, Rfl: 3   cholecalciferol (VITAMIN D3) 25 MCG (1000 UNIT) tablet, Take 1,000 Units by mouth daily., Disp: , Rfl:    clopidogrel  (PLAVIX ) 75 MG tablet, Take 1 tablet (75 mg total) by mouth daily., Disp: 30 tablet, Rfl: 2   escitalopram  (LEXAPRO ) 10 MG tablet, Take 1 tablet (10 mg total) by mouth daily., Disp: 90 tablet, Rfl: 0   FARXIGA  10 MG TABS tablet, Take 1 tablet (10 mg total) by mouth daily before breakfast., Disp: 30 tablet, Rfl: 5   ferrous sulfate  324 MG TBEC, Take 324 mg by mouth daily., Disp: , Rfl:    furosemide  (LASIX ) 20 MG tablet, Take 1 tablet (20 mg total) by mouth daily., Disp: 60 tablet, Rfl: 6   losartan  (COZAAR ) 25 MG tablet, Take 0.5 tablets (12.5 mg total) by mouth daily., Disp: 30 tablet, Rfl: 6   NON FORMULARY, Pt uses a c-pap nightly, Disp: , Rfl:    sildenafil  (REVATIO ) 20 MG tablet, Take 0.5 tablets (10 mg total) by mouth 3 (three) times daily., Disp: 45 tablet, Rfl: 0   Tiotropium Bromide  Monohydrate (SPIRIVA  RESPIMAT) 2.5 MCG/ACT AERS, Inhale 2 puffs into the lungs daily., Disp: 60 each, Rfl: 12   WIXELA INHUB 100-50 MCG/ACT AEPB, INHALE 1 DOSE BY MOUTH TWICE A DAY, Disp: 180 each, Rfl: 1   Subjective:   PATIENT ID: Virginia Camacho GENDER: female DOB: Apr 14, 1981, MRN: [id number]  Chief Complaint  Patient presents with   Follow-up    Review lungs  Pt reports feeling fine lately bu is highly anxious due to being called and suspecting bad news.    HPI  Ms Camacho is a  pleasant 43 year old female presenting for follow up.   I have followed Virginia Camacho since January of 2024 when she presented with shortness of breath. Workup has included PFT's showing obstruction and a chest CT that was reported as having significant centrilobular emphysema. She was hospitalized with increased shortness of breath and found to have decompensated heart failure with echocardiogram showing RV volume and pressure overload. She was diuresed with improvement in her symptoms. 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   Return Visit 06/09/2024: Since our last visit, she had undergone a RHC that showed significant pre-capillary pulmonary hypertension. She was admitted to the hospital a few days following that and was found to have a stroke. She remains in good spirit, and is compliant with her medications. She is accompanied by her mother during today's visit. She remains short of breath with exertion. She denies cough. She is compliant with her diuretics and with her inhalers. She is similarly compliant with CPAP therapy. She was seen today in advanced heart failure clinic.  Return Visit 07/07/2024: She's felt improved since our last visit. She's been following with advanced heart failure for PH and was recently started on Sildenafil  10 mg tid with  plan to up-titrate. She has felt less short of breath and has used her oxygen less frequently. She is still using 2 liters with sleep and with exertion. As part of her workup, she's underwent an MRI of the chest that returned with concern for LAM, and is presenting for a return visit today.  MRI Chest 07/03/2024:  1. Large, elongated posterior mediastinal mass which is heterogeneously T2 hyperintense and with complex internal septation with progressive heterogeneous centripetal contrast enhancement on postcontrast sequences. Lesion measures 14.5 x 6.1 x 4.4 cm. This is situated in the right  aspect of the posterior mediastinum, posterior to the heart and esophagus and with components that appear draped about the aorta. 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   2. 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   She reports no personal history of lung disease. She grew up in [place] and was exposed to extreme cold as a young adult which did not cause her to be short of breath.  She was never treated for any bronchitis or similar presentations.  She was very active in her youth and played a lot of basketball without limitation.  She is actively working on weight loss and has been successful in losing weight.   She has a history of smoking, quit 2 years ago (around 10 pack years of smoking history). She also reports previous heavy marijuana use (around 4-5 rolled cigarettes a day) for a few years, but hasn't done so in a long time. There are no current inhalational exposures reported.    She does not have any pets but used to have a dog. She does notice some environmental allergies but has not had to be treated for them.  She lives in an apartment but does not have any mold on her house is mostly hardwood. 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 Family history is notable for severe asthma in her mother and sister  Ancillary information including prior medications, full medical/surgical/family/social histories, and PFTs (when available) are listed below and have been reviewed.   Review of Systems  Constitutional:  Negative for chills, diaphoresis, fever, malaise/fatigue and weight loss.  Respiratory:  Positive for shortness of breath. Negative for cough,  hemoptysis, sputum production and wheezing.   Cardiovascular:  Negative for chest pain.  Skin:  Negative for rash.     Objective:   Vitals:   07/07/24 1315  BP: 110/80  Pulse: 98  Temp: 97.6 F (36.4 C)  TempSrc: Oral  SpO2: 96%  Weight: 220 lb (99.8 kg)  Height: 5' 4 (1.626 m)   96% on 2 LPM  BMI Readings from Last 3 Encounters:  07/07/24 37.76 kg/m  06/24/24 37.28 kg/m  06/09/24 37.80 kg/m   Wt Readings from Last 3 Encounters:  07/07/24 220 lb (99.8 kg)  06/24/24 217 lb 3.2 oz (98.5 kg)  06/09/24 220 lb 3.2 oz (99.9 kg)    Physical Exam Constitutional:      General: She is not in acute distress.    Appearance: Normal appearance. She is obese. She is not ill-appearing.  HENT:     Head: Normocephalic.     Mouth/Throat:     Mouth: Mucous membranes are moist.  Eyes:     Extraocular  Movements: Extraocular movements intact.     Pupils: Pupils are equal, round, and reactive to light.  Cardiovascular:     Rate and Rhythm: Normal rate.     Pulses: Normal pulses.     Heart sounds: Normal heart sounds.  Pulmonary:     Effort: Pulmonary effort is normal. No respiratory distress.     Breath sounds: Rales present. No wheezing or rhonchi.  Abdominal:     Palpations: Abdomen is soft.  Musculoskeletal:     Cervical back: Normal range of motion.  Skin:    General: Skin is warm.  Neurological:     General: No focal deficit present.     Mental Status: She is alert and oriented to person, place, and time. Mental status is at baseline.       Ancillary Information    Past Medical History:  Diagnosis Date   Allergy    Asthma    Back pain    Diastolic heart failure (HCC) 03/2024   Emphysema lung (HCC)    Morbid obesity with BMI of 40.0-44.9, adult (HCC) 03/20/2016     Family History  Problem Relation Age of Onset   Heart disease Mother    Diabetes Mother    Breast cancer Maternal Grandmother    Heart disease Maternal Grandfather      Past Surgical  History:  Procedure Laterality Date   KNEE SURGERY Right    RIGHT HEART CATH Right 05/18/2024   Procedure: RIGHT HEART CATH;  Surgeon: Mady Bruckner, MD;  Location: [facility] INVASIVE CV LAB;  Service: Cardiovascular;  Laterality: Right;    Social History   Socioeconomic History   Marital status: Single    Spouse name: Not on file   Number of children: 0   Years of education: Not on file   Highest education level: Not on file  Occupational History   Occupation: auditor  Tobacco Use   Smoking status: Former    Current packs/day: 0.00    Average packs/day: 0.3 packs/day for 20.0 years (5.0 ttl pk-yrs)    Types: Cigarettes    Start date: 2002    Quit date: 2022    Years since quitting: 3.5   Smokeless tobacco: Never  Vaping Use   Vaping status: Never Used  Substance and Sexual Activity   Alcohol use: Not Currently    Comment: rarely   Drug use: Not Currently    Frequency: 1.0 times per week    Types: Marijuana    Comment: occasional    Sexual activity: Yes    Partners: Female, Female    Birth control/protection: Condom  Other Topics Concern   Not on file  Social History Narrative   Not on file   Social Drivers of Health   Financial Resource Strain: Low Risk  (07/01/2024)   Received from [facility]   Overall Financial Resource Strain (CARDIA)    Difficulty of Paying Living Expenses: Not hard at all  Food Insecurity: No Food Insecurity (07/01/2024)   Received from [facility]   Hunger Vital Sign    Within the past 12 months, you worried that your food would run out before you got the money to buy more.: Never true    Within the past 12 months, the food you bought just didn't last and you didn't have money to get more.: Never true  Transportation Needs: No Transportation Needs (07/01/2024)   Received from [facility]   [facility] - Transportation  In the past 12 months, has lack of transportation kept you from medical  appointments or from getting medications?: No    Lack of Transportation (Non-Medical): No  Physical Activity: Inactive (05/31/2021)   Exercise Vital Sign    Days of Exercise per Week: 0 days    Minutes of Exercise per Session: 0 min  Stress: No Stress Concern Present (05/31/2021)   [employer] of Occupational Health - Occupational Stress Questionnaire    Feeling of Stress : Only a little  Social Connections: Moderately Isolated (05/21/2024)   Social Connection and Isolation Panel    Frequency of Communication with Friends and Family: More than three times a week    Frequency of Social Gatherings with Friends and Family: More than three times a week    Attends Religious Services: More than 4 times per year    Active Member of Clubs or Organizations: No    Attends Banker Meetings: Never    Marital Status: Never married  Intimate Partner Violence: Not At Risk (05/21/2024)   Humiliation, Afraid, Rape, and Kick questionnaire    Fear of Current or Ex-Partner: No    Emotionally Abused: No    Physically Abused: No    Sexually Abused: No     Allergies  Allergen Reactions   Clarithromycin Hives and Nausea Only    Hot   Oxycodone Hives     CBC    Component Value Date/Time   WBC 3.4 (L) 06/02/2024 1035   RBC 6.81 (H) 06/02/2024 1035   HGB 17.8 (H) 06/02/2024 1035   HGB 18.4 (H) 05/04/2024 1122   HCT 57.9 (H) 06/02/2024 1035   HCT 57.9 (H) 05/04/2024 1122   PLT 227 06/02/2024 1035   PLT 253 05/04/2024 1122   MCV 85.0 06/02/2024 1035   MCV 81 05/04/2024 1122   MCH 26.1 (L) 06/02/2024 1035   MCHC 30.7 (L) 06/02/2024 1035   RDW 19.3 (H) 06/02/2024 1035   RDW 19.7 (H) 05/04/2024 1122   LYMPHSABS 1.0 05/21/2024 0839   LYMPHSABS 2.2 12/24/2022 1130   MONOABS 0.3 05/21/2024 0839   EOSABS 58 06/02/2024 1035   EOSABS 0.1 12/24/2022 1130   BASOSABS 51 06/02/2024 1035   BASOSABS 0.1 12/24/2022 1130    Pulmonary Functions Testing Results:    Latest Ref Rng & Units  06/10/2024    2:03 PM 01/14/2023    4:34 PM  PFT Results  FVC-Pre L 2.10  2.83   FVC-Predicted Pre % 57  77   FVC-Post L 2.14  3.12   FVC-Predicted Post % 58  85   Pre FEV1/FVC % % 46  63   Post FEV1/FCV % % 47  68   FEV1-Pre L 0.96  1.78   FEV1-Predicted Pre % 32  59   FEV1-Post L 1.00  2.12   DLCO uncorrected ml/min/mmHg 7.16  10.07   DLCO UNC% % 33  46   DLVA Predicted % 37  51   TLC L 12.17  5.53   TLC % Predicted % 240  109   RV % Predicted % 593  149     Outpatient Medications Prior to Visit  Medication Sig Dispense Refill   albuterol  (ACCUNEB ) 1.25 MG/3ML nebulizer solution Take 3 mLs (1.25 mg total) by nebulization every 6 (six) hours as needed for wheezing. 75 mL 12   albuterol  (VENTOLIN  HFA) 108 (90 Base) MCG/ACT inhaler Inhale 1-2 puffs into the lungs every 6 (six) hours as needed for wheezing or shortness of  breath.     aspirin  EC 81 MG tablet Take 1 tablet (81 mg total) by mouth daily. Swallow whole. 30 tablet 2   atorvastatin  (LIPITOR) 40 MG tablet Take 1 tablet (40 mg total) by mouth daily. 30 tablet 2   bisoprolol  (ZEBETA ) 5 MG tablet Take 0.5 tablets (2.5 mg total) by mouth daily. 45 tablet 3   cholecalciferol (VITAMIN D3) 25 MCG (1000 UNIT) tablet Take 1,000 Units by mouth daily.     clopidogrel  (PLAVIX ) 75 MG tablet Take 1 tablet (75 mg total) by mouth daily. 30 tablet 2   escitalopram  (LEXAPRO ) 10 MG tablet Take 1 tablet (10 mg total) by mouth daily. 90 tablet 0   FARXIGA  10 MG TABS tablet Take 1 tablet (10 mg total) by mouth daily before breakfast. 30 tablet 5   ferrous sulfate  324 MG TBEC Take 324 mg by mouth daily.     furosemide  (LASIX ) 20 MG tablet Take 1 tablet (20 mg total) by mouth daily. 60 tablet 6   losartan  (COZAAR ) 25 MG tablet Take 0.5 tablets (12.5 mg total) by mouth daily. 30 tablet 6   NON FORMULARY Pt uses a c-pap nightly     sildenafil  (REVATIO ) 20 MG tablet Take 0.5 tablets (10 mg total) by mouth 3 (three) times daily. 45 tablet 0   Tiotropium  Bromide Monohydrate (SPIRIVA  RESPIMAT) 2.5 MCG/ACT AERS Inhale 2 puffs into the lungs daily. 60 each 12   WIXELA INHUB 100-50 MCG/ACT AEPB INHALE 1 DOSE BY MOUTH TWICE A DAY 180 each 1   No facility-administered medications prior to visit.

## 2024-07-09 ENCOUNTER — Encounter: Payer: Self-pay | Admitting: Family

## 2024-07-12 ENCOUNTER — Telehealth: Payer: Self-pay | Admitting: Student in an Organized Health Care Education/Training Program

## 2024-07-12 ENCOUNTER — Telehealth: Payer: Self-pay | Admitting: Family

## 2024-07-12 NOTE — Progress Notes (Unsigned)
 Advanced Heart Failure Clinic Note    Referring Physician: Dr Darliss PCP: Gareth Mliss FALCON, FNP Cardiologist: Redell Darliss, MD / Gerard Frederick, NP  Chief Complaint: shortness of breath   HPI:  Virginia Camacho is a 43 y/o female kindly referred by Dr. Darliss. 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   Admitted 03/11/24 with complaints of shortness of breath. She has history of asthma and recent diagnosis of emphysema. She was noted to be at her PCPs office with oxygen saturations in the 70s and she was given albuterol  and steroids and admitted for further evaluation. She was treated for acute respiratory failure with hypoxia and HFpEF exacerbation. Echo 03/12/24: LVEF 55%, no RWMA, RA volume overload mildly reduced systolic function with severe RV enlargement and moderately elevated pulmonary artery systolic pressure. She was treated with IV Lasix  and was diuresed about 10 pounds. Losartan  was added to her medication regimen along with oral furosemide  twice daily. She was educated and encouraged to continue with CPAP regularly to prevent further right heart failure. She also had mediastinal masslike opacity noted on resolution CT recently ordered by pulmonary with recommendation to continue with outpatient follow-up. She was considered stable and discharged 03/17/24.   RHC 05/18/24: Severe pulmonary hypertension (mean PA 52 mmHg, PVR 10 WU). Normal left heart filling pressure (PCWP 10 mmHg). Upper normal to mildly elevated right heart filling pressure (mean RA 7, RVEDP 10 mmHg). Mildly to moderately reduced Fick cardiac output/index (CO 4.2 L/min, CI 2.1 L/min/m^2).  Admitted 05/21/24 with sudden onset of vision problems, unsteady gait upon waking. Found to have an acute right thalamic stroke on MRI brain. Neurology consulted. DAPT with ASA 81 mg daily + Plavix  75 mg  daily x 3 weeks, then ASA 81 daily monotherapy. Cardiology to mail zio monitor to patient. Antibiotics for pneumonia. CTA chest today ruled out PE, confirmed RLL infiltrate / PNA. Qualified for home oxygen.     Seen in [facility] 07/25 where farxiga  10mg  daily was started.   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 EF 55-60%, moderately reduced RV  Seen in [facility] 07/25 where sildenafil  10mg  TID was started.   She presents today for a HF follow-up visit with a chief complaint of shortness of breath. Occasional fatigue. Denies chest pain, palpitations, dizziness, pedal edema or abdominal distention. Did have an episode of worsened SOB a few days ago and is unclear of what prompted that. SOB has now stabilized. No issues with sildenafil  that she's aware of. Wearing her oxygen around the clock.   Used to smoke tobacco/ marijuana years ago. Says that when she smoked marijuana, she used paper that had tobacco in it. Rare etoh and no current drug use.    ROS: All systems negative except what is listed in HPI, PMH and Problem List   Past Medical History:  Diagnosis Date   Allergy    Asthma    Back pain    Diastolic heart failure (HCC) 03/2024   Emphysema lung (HCC)    Morbid obesity with BMI of 40.0-44.9, adult (HCC) 03/20/2016    Current Outpatient Medications  Medication Sig Dispense Refill   albuterol  (ACCUNEB ) 1.25 MG/3ML nebulizer solution Take 3 mLs (1.25 mg total) by nebulization every 6 (six) hours as needed for wheezing. 75 mL 12   albuterol  (VENTOLIN  HFA) 108 (90 Base) MCG/ACT inhaler Inhale 1-2 puffs into  the lungs every 6 (six) hours as needed for wheezing or shortness of breath.     aspirin  EC 81 MG tablet Take 1 tablet (81 mg total) by mouth daily. Swallow whole. 30 tablet 2   atorvastatin  (LIPITOR) 40 MG tablet Take 1 tablet (40 mg total) by mouth daily. 30 tablet 2   bisoprolol  (ZEBETA ) 5 MG tablet Take 0.5 tablets (2.5 mg total) by mouth  daily. 45 tablet 3   cholecalciferol (VITAMIN D3) 25 MCG (1000 UNIT) tablet Take 1,000 Units by mouth daily.     clopidogrel  (PLAVIX ) 75 MG tablet Take 1 tablet (75 mg total) by mouth daily. 30 tablet 2   escitalopram  (LEXAPRO ) 10 MG tablet Take 1 tablet (10 mg total) by mouth daily. 90 tablet 0   FARXIGA  10 MG TABS tablet Take 1 tablet (10 mg total) by mouth daily before breakfast. 30 tablet 5   ferrous sulfate  324 MG TBEC Take 324 mg by mouth daily.     furosemide  (LASIX ) 20 MG tablet Take 1 tablet (20 mg total) by mouth daily. 60 tablet 6   losartan  (COZAAR ) 25 MG tablet Take 0.5 tablets (12.5 mg total) by mouth daily. 30 tablet 6   NON FORMULARY Pt uses a c-pap nightly     sildenafil  (REVATIO ) 20 MG tablet Take 0.5 tablets (10 mg total) by mouth 3 (three) times daily. 45 tablet 0   Tiotropium Bromide  Monohydrate (SPIRIVA  RESPIMAT) 2.5 MCG/ACT AERS Inhale 2 puffs into the lungs daily. 60 each 12   WIXELA INHUB 100-50 MCG/ACT AEPB INHALE 1 DOSE BY MOUTH TWICE A DAY 180 each 1   No current facility-administered medications for this visit.    Allergies  Allergen Reactions   Clarithromycin Hives and Nausea Only    Hot   Oxycodone Hives      Social History   Socioeconomic History   Marital status: Single    Spouse name: Not on file   Number of children: 0   Years of education: Not on file   Highest education level: Not on file  Occupational History   Occupation: auditor  Tobacco Use   Smoking status: Former    Current packs/day: 0.00    Average packs/day: 0.3 packs/day for 20.0 years (5.0 ttl pk-yrs)    Types: Cigarettes    Start date: 2002    Quit date: 2022    Years since quitting: 3.5   Smokeless tobacco: Never  Vaping Use   Vaping status: Never Used  Substance and Sexual Activity   Alcohol use: Not Currently    Comment: rarely   Drug use: Not Currently    Frequency: 1.0 times per week    Types: Marijuana    Comment: occasional    Sexual activity: Yes     Partners: Female, Female    Birth control/protection: Condom  Other Topics Concern   Not on file  Social History Narrative   Not on file   Social Drivers of Health   Financial Resource Strain: Low Risk  (07/01/2024)   Received from [facility]   Overall Financial Resource Strain (CARDIA)    Difficulty of Paying Living Expenses: Not hard at all  Food Insecurity: No Food Insecurity (07/01/2024)   Received from [facility]   Hunger Vital Sign    Within the past 12 months, you worried that your food would run out before you got the money to buy more.: Never true    Within the past 12 months, the  food you bought just didn't last and you didn't have money to get more.: Never true  Transportation Needs: No Transportation Needs (07/01/2024)   Received from [facility] - Transportation    In the past 12 months, has lack of transportation kept you from medical appointments or from getting medications?: No    Lack of Transportation (Non-Medical): No  Physical Activity: Inactive (05/31/2021)   Exercise Vital Sign    Days of Exercise per Week: 0 days    Minutes of Exercise per Session: 0 min  Stress: No Stress Concern Present (05/31/2021)   [employer] of Occupational Health - Occupational Stress Questionnaire    Feeling of Stress : Only a little  Social Connections: Moderately Isolated (05/21/2024)   Social Connection and Isolation Panel    Frequency of Communication with Friends and Family: More than three times a week    Frequency of Social Gatherings with Friends and Family: More than three times a week    Attends Religious Services: More than 4 times per year    Active Member of [employer] or Organizations: No    Attends Banker Meetings: Never    Marital Status: Never married  Intimate Partner Violence: Not At Risk (05/21/2024)   Humiliation, Afraid, Rape, and Kick questionnaire    Fear of Current or Ex-Partner: No     Emotionally Abused: No    Physically Abused: No    Sexually Abused: No      Family History  Problem Relation Age of Onset   Heart disease Mother    Diabetes Mother    Breast cancer Maternal Grandmother    Heart disease Maternal Grandfather    Vitals:   07/13/24 1211  BP: 118/86  Pulse: 86  SpO2: 90%  Weight: 217 lb 6 oz (98.6 kg)   Wt Readings from Last 3 Encounters:  07/13/24 217 lb 6 oz (98.6 kg)  07/07/24 220 lb (99.8 kg)  06/24/24 217 lb 3.2 oz (98.5 kg)   Lab Results  Component Value Date   CREATININE 0.72 06/24/2024   CREATININE 0.88 06/09/2024   CREATININE 0.86 06/02/2024    PHYSICAL EXAM:  General: Well appearing.  Cor: No JVD. Regular rhythm, rate.  Lungs: clear Abdomen: soft, nontender, nondistended. Extremities: no edema Neuro:. 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 EF 55-60%, moderately reduced RV - continue bisoprolol  2.5mg  daily - continue farxiga  10mg  daily - continue furosemide  20mg  daily; instructed to take additional 20mg  daily if develops overnight weight gain or worsening edema - continue losartan  12.5mg  daily - may not be able to tolerate MRA and she has had K+ in the low 5's in the past - aware to limit fluids to 60-64 ounces daily and limit sodium to 2000mg  daily - saw cardiology ([place]) 05/25 - BNP 05/22/24 was 208.7  2: pHTN with ASD- - increase sildenafil  to 20mg  TID  - begin Opsumit  10mg  daily - N. Wise RPH-CPP counseled patient on opsumit  and has filled out PA form for the medication. If symptoms don't improve, could consider tyvaso due to her history of  lung disease - explained  that the goal would be to reduce the pressure in the RV low enough to then have the ASD closed  - plan to repeat RHC in Sept/ Oct to re-check pressures - saw pulmonology (Dgayli) 07/25 - PFT's done by pulmonology 06/21/24 - wearing her oxygen most of the time even when asleep with her CPAP - TSH 06/09/24 was 1.459 - ANA mildly + - RHC 05/18/24:  1 Severe pulmonary hypertension (mean PA 52 mmHg, PVR 10 WU).  2 Normal left heart filling pressure (PCWP 10 mmHg).  3 Upper normal to mildly elevated right heart filling pressure (mean RA 7, RVEDP 10 mmHg).  4 Mildly to moderately reduced Fick cardiac output/index (CO 4.2 L/min, CI 2.1 L/min/m^2).  3: HTN- - BP 118/86 - saw PCP Mur[place]an) 06/25 - BMET 06/24/24 reviewed: sodium 138, potassium 4.6, creatinine 0.72 & GFR >60  3: CVA- - acute right thalamic stroke 06/25 - Continue ASA 81 daily - saw neurology 07/25  4: Hyperlipidemia- - LDL 05/22/24 was 113 (was 141 in 11/24) - continue atorvastatin  40mg  daily  5: COPD- - saw pulmonology (Dgayli) 07/25 - continue spiriva  and wixela - albuterol  PRN - 6 minute walk test has been done   Return in 2-3 weeks, sooner if needed.   Ellouise DELENA Class, FNP 07/12/24

## 2024-07-12 NOTE — Telephone Encounter (Signed)
 Called to confirm/remind patient of their appointment at the Advanced [facility] Failure Clinic on 07/13/24.   Appointment:   [x] Confirmed  [] Left mess   [] No answer/No voice mail  [] VM Full/unable to leave message  [] Phone not in service  Patient reminded to bring all medications and/or complete list.  Confirmed patient has transportation. Gave directions, instructed to utilize valet parking.

## 2024-07-12 NOTE — Telephone Encounter (Signed)
 [employer] is wanting you to do an addendum to you know stating the need for POC Jackson Macintosh   Can we have provider add a POC/pulse dose narrative to their progress note/OV please.

## 2024-07-13 ENCOUNTER — Telehealth: Payer: Self-pay

## 2024-07-13 ENCOUNTER — Ambulatory Visit: Attending: Family | Admitting: Family

## 2024-07-13 ENCOUNTER — Encounter: Payer: Self-pay | Admitting: Family

## 2024-07-13 ENCOUNTER — Other Ambulatory Visit (HOSPITAL_COMMUNITY): Payer: Self-pay

## 2024-07-13 VITALS — BP 118/86 | HR 86 | Wt 217.4 lb

## 2024-07-13 DIAGNOSIS — I639 Cerebral infarction, unspecified: Secondary | ICD-10-CM | POA: Diagnosis not present

## 2024-07-13 DIAGNOSIS — I272 Pulmonary hypertension, unspecified: Secondary | ICD-10-CM | POA: Diagnosis not present

## 2024-07-13 DIAGNOSIS — J449 Chronic obstructive pulmonary disease, unspecified: Secondary | ICD-10-CM

## 2024-07-13 DIAGNOSIS — Z9981 Dependence on supplemental oxygen: Secondary | ICD-10-CM | POA: Diagnosis not present

## 2024-07-13 DIAGNOSIS — I5032 Chronic diastolic (congestive) heart failure: Secondary | ICD-10-CM

## 2024-07-13 DIAGNOSIS — Q211 Atrial septal defect, unspecified: Secondary | ICD-10-CM | POA: Diagnosis not present

## 2024-07-13 DIAGNOSIS — J4489 Other specified chronic obstructive pulmonary disease: Secondary | ICD-10-CM | POA: Diagnosis not present

## 2024-07-13 DIAGNOSIS — R0602 Shortness of breath: Secondary | ICD-10-CM | POA: Diagnosis present

## 2024-07-13 DIAGNOSIS — I428 Other cardiomyopathies: Secondary | ICD-10-CM | POA: Insufficient documentation

## 2024-07-13 DIAGNOSIS — Z79899 Other long term (current) drug therapy: Secondary | ICD-10-CM | POA: Insufficient documentation

## 2024-07-13 DIAGNOSIS — Z87891 Personal history of nicotine dependence: Secondary | ICD-10-CM | POA: Insufficient documentation

## 2024-07-13 DIAGNOSIS — G4733 Obstructive sleep apnea (adult) (pediatric): Secondary | ICD-10-CM | POA: Insufficient documentation

## 2024-07-13 DIAGNOSIS — I10 Essential (primary) hypertension: Secondary | ICD-10-CM

## 2024-07-13 DIAGNOSIS — E785 Hyperlipidemia, unspecified: Secondary | ICD-10-CM | POA: Diagnosis not present

## 2024-07-13 DIAGNOSIS — Z8673 Personal history of transient ischemic attack (TIA), and cerebral infarction without residual deficits: Secondary | ICD-10-CM | POA: Insufficient documentation

## 2024-07-13 DIAGNOSIS — Z7982 Long term (current) use of aspirin: Secondary | ICD-10-CM | POA: Insufficient documentation

## 2024-07-13 DIAGNOSIS — E7800 Pure hypercholesterolemia, unspecified: Secondary | ICD-10-CM

## 2024-07-13 DIAGNOSIS — I110 Hypertensive heart disease with heart failure: Secondary | ICD-10-CM | POA: Diagnosis not present

## 2024-07-13 MED ORDER — SILDENAFIL CITRATE 20 MG PO TABS: 20.0000 mg | ORAL_TABLET | Freq: Three times a day (TID) | ORAL | 5 refills | Status: DC

## 2024-07-13 MED ORDER — OPSUMIT 10 MG PO TABS: 10.0000 mg | ORAL_TABLET | Freq: Every day | ORAL | 5 refills | Status: AC

## 2024-07-13 NOTE — Telephone Encounter (Signed)
 Disability paperwork completed and signed by provider and patient and faxed successfully to [employer]. Medical Record requests from [employer] faxed successfully to the Medical Record Dept and the fax number was sent to [employer] as well.

## 2024-07-13 NOTE — Patient Instructions (Signed)
 Medication Changes:  INCREASE SILDENAFIL  TO 20 MG 3 TIMES DAILY  START TAKING OPSUMIT  10 MG ONCE DAILY   Follow-Up in: 2-3 WEEKS WITH TINA HACKNEY, FNP.   Thank you for choosing [facility].    At the Advanced Heart Failure Clinic, you and your health needs are our priority. We have a designated team specialized in the treatment of Heart Failure. This Care Team includes your primary Heart Failure Specialized Cardiologist (physician), Advanced Practice Providers (APPs- Physician Assistants and Nurse Practitioners), and Pharmacist who all work together to provide you with the care you need, when you need it.   You may see any of the following providers on your designated Care Team at your next follow up:  Dr. Toribio Fuel Dr. Ezra Shuck Dr. Ria Commander Dr. Morene Brownie Ellouise Class, FNP Jaun Bash, RPH-CPP  Please be sure to bring in all your medications bottles to every appointment.   Need to Contact Us :  If you have any questions or concerns before your next appointment please send us  a message through [place] or call our office at [phone].    TO LEAVE A MESSAGE FOR THE NURSE SELECT OPTION 2, PLEASE LEAVE A MESSAGE INCLUDING: YOUR NAME DATE OF BIRTH CALL BACK NUMBER REASON FOR CALL**this is important as we prioritize the call backs  YOU WILL RECEIVE A CALL BACK THE SAME DAY AS LONG AS YOU CALL BEFORE 4:00 PM

## 2024-07-13 NOTE — Telephone Encounter (Signed)
 I have forwarded Dr. Clydene note to [employer]

## 2024-07-14 ENCOUNTER — Telehealth: Payer: Self-pay | Admitting: Pharmacist

## 2024-07-14 ENCOUNTER — Other Ambulatory Visit (HOSPITAL_COMMUNITY): Payer: Self-pay

## 2024-07-14 MED ORDER — OPSUMIT 10 MG PO TABS: 10.0000 mg | ORAL_TABLET | Freq: Every day | ORAL | 3 refills | Status: AC

## 2024-07-14 NOTE — Telephone Encounter (Signed)
 Dr. Isadora Macintosh with [employer] is still stating your note doesn't say Jackson Macintosh   I still am not seeing the mention of a pulse dose concentrator/POC

## 2024-07-14 NOTE — Telephone Encounter (Signed)
 PA for Opsumit  10 mg daily approved (Key: [id number]). Start form faxed to [employer].

## 2024-07-16 ENCOUNTER — Encounter: Payer: Self-pay | Admitting: Family

## 2024-07-19 ENCOUNTER — Telehealth: Payer: Self-pay

## 2024-07-19 NOTE — Telephone Encounter (Signed)
 Fax with prior auth information from [employer] faxed to Stephaine Henry for review and scanned into pt's file.

## 2024-07-26 ENCOUNTER — Other Ambulatory Visit (HOSPITAL_COMMUNITY): Payer: Self-pay

## 2024-08-01 ENCOUNTER — Other Ambulatory Visit: Payer: Self-pay | Admitting: Nurse Practitioner

## 2024-08-01 DIAGNOSIS — J4520 Mild intermittent asthma, uncomplicated: Secondary | ICD-10-CM

## 2024-08-03 ENCOUNTER — Telehealth: Payer: Self-pay | Admitting: Family

## 2024-08-03 NOTE — Telephone Encounter (Signed)
 Called to confirm/remind patient of their appointment at the Advanced [facility] Failure Clinic on 08/04/24.   Appointment:   [x] Confirmed  [] Left mess   [] No answer/No voice mail  [] VM Full/unable to leave message  [] Phone not in service  Patient reminded to bring all medications and/or complete list.  Confirmed patient has transportation. Gave directions, instructed to utilize valet parking.

## 2024-08-03 NOTE — Telephone Encounter (Signed)
 Requested Prescriptions  Pending Prescriptions Disp Refills   WIXELA INHUB 100-50 MCG/ACT AEPB [Pharmacy Med Name: WIXELA 100-50 INHUB] 180 each 1    Sig: INHALE 1 DOSE BY MOUTH TWICE A DAY     Pulmonology:  [employer] Passed - 08/03/2024 10:23 AM      Passed - Valid encounter within last 12 months    Recent Outpatient Visits           2 months ago Hospital discharge follow-up   [facility] Gareth Clarity F, FNP   3 months ago Heart failure with preserved ejection fraction, unspecified HF chronicity [facility])   [facility] Gareth Clarity FALCON, FNP   4 months ago Hospital discharge follow-up   [facility] Gareth Clarity FALCON, FNP   4 months ago    [facility] Gareth Clarity F, FNP   4 months ago Moderate persistent asthma with acute exacerbation   [facility] Gareth Clarity FALCON, FNP       Future Appointments             In 1 month Gerard Frederick, NP [facility]

## 2024-08-04 ENCOUNTER — Encounter: Payer: Self-pay | Admitting: Family

## 2024-08-04 ENCOUNTER — Ambulatory Visit: Payer: Self-pay | Admitting: Family

## 2024-08-04 ENCOUNTER — Ambulatory Visit: Admitting: Student in an Organized Health Care Education/Training Program

## 2024-08-04 ENCOUNTER — Other Ambulatory Visit
Admission: RE | Admit: 2024-08-04 | Discharge: 2024-08-04 | Disposition: A | Discharge status: Home / Self Care | Source: Ambulatory Visit | Attending: Family | Admitting: Family

## 2024-08-04 ENCOUNTER — Ambulatory Visit: Admitting: Family

## 2024-08-04 VITALS — BP 118/80 | HR 92 | Wt 220.0 lb

## 2024-08-04 DIAGNOSIS — E785 Hyperlipidemia, unspecified: Secondary | ICD-10-CM

## 2024-08-04 DIAGNOSIS — I272 Pulmonary hypertension, unspecified: Secondary | ICD-10-CM

## 2024-08-04 DIAGNOSIS — I5032 Chronic diastolic (congestive) heart failure: Secondary | ICD-10-CM | POA: Diagnosis not present

## 2024-08-04 DIAGNOSIS — R918 Other nonspecific abnormal finding of lung field: Secondary | ICD-10-CM | POA: Diagnosis not present

## 2024-08-04 DIAGNOSIS — Z87891 Personal history of nicotine dependence: Secondary | ICD-10-CM | POA: Diagnosis not present

## 2024-08-04 DIAGNOSIS — I428 Other cardiomyopathies: Secondary | ICD-10-CM | POA: Insufficient documentation

## 2024-08-04 DIAGNOSIS — J45909 Unspecified asthma, uncomplicated: Secondary | ICD-10-CM | POA: Diagnosis not present

## 2024-08-04 DIAGNOSIS — Z7982 Long term (current) use of aspirin: Secondary | ICD-10-CM | POA: Diagnosis not present

## 2024-08-04 DIAGNOSIS — R0602 Shortness of breath: Secondary | ICD-10-CM | POA: Diagnosis present

## 2024-08-04 DIAGNOSIS — Z8673 Personal history of transient ischemic attack (TIA), and cerebral infarction without residual deficits: Secondary | ICD-10-CM | POA: Diagnosis not present

## 2024-08-04 DIAGNOSIS — G4733 Obstructive sleep apnea (adult) (pediatric): Secondary | ICD-10-CM | POA: Insufficient documentation

## 2024-08-04 DIAGNOSIS — M549 Dorsalgia, unspecified: Secondary | ICD-10-CM | POA: Diagnosis not present

## 2024-08-04 DIAGNOSIS — I639 Cerebral infarction, unspecified: Secondary | ICD-10-CM

## 2024-08-04 DIAGNOSIS — Z7984 Long term (current) use of oral hypoglycemic drugs: Secondary | ICD-10-CM | POA: Insufficient documentation

## 2024-08-04 DIAGNOSIS — G8929 Other chronic pain: Secondary | ICD-10-CM | POA: Diagnosis not present

## 2024-08-04 DIAGNOSIS — J439 Emphysema, unspecified: Secondary | ICD-10-CM | POA: Insufficient documentation

## 2024-08-04 DIAGNOSIS — I10 Essential (primary) hypertension: Secondary | ICD-10-CM

## 2024-08-04 DIAGNOSIS — I110 Hypertensive heart disease with heart failure: Secondary | ICD-10-CM | POA: Diagnosis not present

## 2024-08-04 DIAGNOSIS — J449 Chronic obstructive pulmonary disease, unspecified: Secondary | ICD-10-CM

## 2024-08-04 LAB — BASIC METABOLIC PANEL WITH GFR
Anion gap: 11 (ref 5–15)
BUN: 16 mg/dL (ref 6–20)
CO2: 24 mmol/L (ref 22–32)
Calcium: 9.4 mg/dL (ref 8.9–10.3)
Chloride: 104 mmol/L (ref 98–111)
Creatinine, Ser: 0.77 mg/dL (ref 0.44–1.00)
GFR, Estimated: >60 mL/min (ref >60)
Glucose, Bld: 86 mg/dL (ref 70–99)
Potassium: 4.2 mmol/L (ref 3.5–5.1)
Sodium: 139 mmol/L (ref 135–145)

## 2024-08-04 LAB — CBC
HCT: 51.2 % — ABNORMAL HIGH (ref 36.0–46.0)
Hemoglobin: 16.6 g/dL — ABNORMAL HIGH (ref 12.0–15.0)
MCH: 26.9 pg (ref 26.0–34.0)
MCHC: 32.4 g/dL (ref 30.0–36.0)
MCV: 83.1 fL (ref 80.0–100.0)
Platelets: 258 K/uL (ref 150–400)
RBC: 6.16 MIL/uL — ABNORMAL HIGH (ref 3.87–5.11)
RDW: 14.6 % (ref 11.5–15.5)
WBC: 4.8 K/uL (ref 4.0–10.5)
nRBC: 0 % (ref 0.0–0.2)

## 2024-08-04 MED ORDER — SILDENAFIL CITRATE 20 MG PO TABS: 40.0000 mg | ORAL_TABLET | Freq: Three times a day (TID) | ORAL | 5 refills | Status: AC

## 2024-08-04 NOTE — Patient Instructions (Addendum)
 Medication Changes:  INCREASE SILDENAFIL  TO 40 MG 3 TIMES DAILY   Lab Work:  Go over to the MEDICAL MALL. Go pass the gift shop and have your blood work completed.  We will only call you if the results are abnormal or if the provider would like to make medication changes.  No news is good news.    Testing/Procedures:  Your physician has requested that you have a cardiac catheterization. Cardiac catheterization is used to diagnose and/or treat various heart conditions. Doctors may recommend this procedure for a number of different reasons. The most common reason is to evaluate chest pain. Chest pain can be a symptom of coronary artery disease (CAD), and cardiac catheterization can show whether plaque is narrowing or blocking your heart's arteries. This procedure is also used to evaluate the valves, as well as measure the blood flow and oxygen levels in different parts of your heart. For further information please visit [email or web address] Please follow instruction sheet, as given.    [facility] [address] [place] [address] Dept: [phone]  Virginia Camacho  08/04/2024  You are scheduled for a Cardiac Catheterization on Thursday, September 25 with Dr. Ria Commander.  1. Please arrive at the [facility] (Main Entrance A) at [facility]: [address] [place], [place] [address] at 11:30 AM (This time is 2 hour(s) before your procedure to ensure your preparation).   Free valet parking service is available. You will check in at ADMITTING. The support person will be asked to wait in the waiting room.  It is OK to have someone drop you off and come back when you are ready to be discharged.    Special note: Every effort is made to have your procedure done on time. Please understand that emergencies sometimes delay scheduled procedures.  2. Diet: Nothing to eat after  midnight.   3. Hydration: On September 25, you may drink approved liquids (see below) until 2 hours before the procedure with 8 oz of water as your last intake.   List of approved liquids water, clear juice, clear tea, black coffee, fruit juices, non-citric and without pulp, carbonated beverages, Gatorade, Kool -Aid, plain Jello-O and plain ice popsicles.  4. Labs: You will need to have blood drawn on today.  5. Medication instructions in preparation for your procedure:  Do not take the day of your procedure: Farxiga , Furosemide .    Contrast Allergy: No   On the morning of your procedure, take your Aspirin  81 mg and any morning medicines NOT listed above.  You may use sips of water.  6. Plan to go home the same day, you will only stay overnight if medically necessary. 7. Bring a current list of your medications and current insurance cards. 8. You MUST have a responsible person to drive you home. 9. Someone MUST be with you the first 24 hours after you arrive home or your discharge will be delayed. 10. Please wear clothes that are easy to get on and off and wear slip-on shoes.  Thank you for allowing us  to care for you!   -- [facility]    Follow-Up: AFTER YOUR CATHETERIZATION.   Thank you for choosing [facility] Advanced Heart Failure Clinic.    At the Advanced Heart Failure Clinic, you and your health needs are our priority. We have a designated team specialized in the treatment of Heart Failure. This Care  Team includes your primary Heart Failure Specialized Cardiologist (physician), Advanced Practice Providers (APPs- Physician Assistants and Nurse Practitioners), and Pharmacist who all work together to provide you with the care you need, when you need it.   You may see any of the following providers on your designated Care Team at your next follow up:  Dr. Toribio Fuel Dr. Ezra Shuck Dr. Ria Commander Dr. Morene Brownie Ellouise Class, FNP Jaun Bash, RPH-CPP  Please be sure to bring in all your medications bottles to every appointment.   Need to Contact Us :  If you have any questions or concerns before your next appointment please send us  a message through [place] or call our office at [phone].    TO LEAVE A MESSAGE FOR THE NURSE SELECT OPTION 2, PLEASE LEAVE A MESSAGE INCLUDING: YOUR NAME DATE OF BIRTH CALL BACK NUMBER REASON FOR CALL**this is important as we prioritize the call backs  YOU WILL RECEIVE A CALL BACK THE SAME DAY AS LONG AS YOU CALL BEFORE 4:00 PM

## 2024-08-04 NOTE — Progress Notes (Signed)
 Advanced Heart Failure Clinic Note    Referring Physician: Dr Darliss PCP: Gareth Mliss FALCON, FNP Cardiologist: Redell Darliss, MD / Gerard Frederick, NP  Chief Complaint: shortness of breath   HPI:  Virginia Camacho is a 43 y/o female kindly referred by Dr. Darliss. 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   Admitted 03/11/24 with complaints of shortness of breath. She has history of asthma and recent diagnosis of emphysema. She was noted to be at her PCPs office with oxygen saturations in the 70s and she was given albuterol  and steroids and admitted for further evaluation. She was treated for acute respiratory failure with hypoxia and HFpEF exacerbation. Echo 03/12/24: LVEF 55%, no RWMA, RA volume overload mildly reduced systolic function with severe RV enlargement and moderately elevated pulmonary artery systolic pressure. She was treated with IV Lasix  and was diuresed about 10 pounds. Losartan  was added to her medication regimen along with oral furosemide  twice daily. She was educated and encouraged to continue with CPAP regularly to prevent further right heart failure. She also had mediastinal masslike opacity noted on resolution CT recently ordered by pulmonary with recommendation to continue with outpatient follow-up. She was considered stable and discharged 03/17/24.   RHC 05/18/24: Severe pulmonary hypertension (mean PA 52 mmHg, PVR 10 WU). Normal left heart filling pressure (PCWP 10 mmHg). Upper normal to mildly elevated right heart filling pressure (mean RA 7, RVEDP 10 mmHg). Mildly to moderately reduced Fick cardiac output/index (CO 4.2 L/min, CI 2.1 L/min/m^2).  Admitted 05/21/24 with sudden onset of vision problems, unsteady gait upon waking. Found to have an acute right thalamic stroke on MRI brain. Neurology consulted. DAPT with ASA 81 mg daily + Plavix  75 mg  daily x 3 weeks, then ASA 81 daily monotherapy. Cardiology to mail zio monitor to patient. Antibiotics for pneumonia. CTA chest today ruled out PE, confirmed RLL infiltrate / PNA. Qualified for home oxygen.     Seen in [facility] 07/25 where farxiga  10mg  daily was started.   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 EF 55-60%, moderately reduced RV  Seen in [facility] 07/25 where sildenafil  10mg  TID was started.   Seen in [facility] 08/25 where sildenafil  was increased to 20mg  TID and Opsumit  10mg  daily was started.   She presents today for a HF follow-up visit with a chief complaint of shortness of breath.   Reports improved symptoms. SHOB only with walking long  distanced, she gets a little winded but it does not affect daily activities. Denies chest pain, palpitations, dizziness,cough, pedal edema or abdominal distention.   No issues with sildenafil  or opsumit  that she's aware of. Wearing her oxygen around the clock and reports O2 saturation above 90%. Virginia Camacho quality sleep with no interruptions.   Maintaining fluid  intake of 64 oz and 2000mg  sodium restriction   Cardiologist contacted for upcoming catherization. Upcoming appointment with pulmonologist next month.    ROS: All systems negative except what is listed in HPI, PMH and Problem List   Past Medical History:  Diagnosis Date   Allergy    Asthma    Back pain    Diastolic heart failure (HCC) 03/2024   Emphysema lung (HCC)    Morbid obesity with BMI of 40.0-44.9, adult (HCC) 03/20/2016    Current Outpatient Medications  Medication Sig Dispense Refill   albuterol  (ACCUNEB ) 1.25 MG/3ML nebulizer  solution Take 3 mLs (1.25 mg total) by nebulization every 6 (six) hours as needed for wheezing. 75 mL 12   albuterol  (VENTOLIN  HFA) 108 (90 Base) MCG/ACT inhaler Inhale 1-2 puffs into the lungs every 6 (six) hours as needed for wheezing or shortness of breath.     aspirin  EC 81 MG tablet Take 1 tablet  (81 mg total) by mouth daily. Swallow whole. 30 tablet 2   atorvastatin  (LIPITOR) 40 MG tablet Take 1 tablet (40 mg total) by mouth daily. 30 tablet 2   bisoprolol  (ZEBETA ) 5 MG tablet Take 0.5 tablets (2.5 mg total) by mouth daily. 45 tablet 3   cholecalciferol (VITAMIN D3) 25 MCG (1000 UNIT) tablet Take 1,000 Units by mouth daily.     escitalopram  (LEXAPRO ) 10 MG tablet Take 1 tablet (10 mg total) by mouth daily. 90 tablet 0   FARXIGA  10 MG TABS tablet Take 1 tablet (10 mg total) by mouth daily before breakfast. 30 tablet 5   ferrous sulfate  324 MG TBEC Take 324 mg by mouth daily.     furosemide  (LASIX ) 20 MG tablet Take 1 tablet (20 mg total) by mouth daily. 60 tablet 6   losartan  (COZAAR ) 25 MG tablet Take 0.5 tablets (12.5 mg total) by mouth daily. 30 tablet 6   macitentan  (OPSUMIT ) 10 MG tablet Take 1 tablet (10 mg total) by mouth daily. 30 tablet 5   macitentan  (OPSUMIT ) 10 MG tablet Take 1 tablet (10 mg total) by mouth daily. 90 tablet 3   NON FORMULARY Pt uses a c-pap nightly     sildenafil  (REVATIO ) 20 MG tablet Take 1 tablet (20 mg total) by mouth 3 (three) times daily. 90 tablet 5   Tiotropium Bromide  Monohydrate (SPIRIVA  RESPIMAT) 2.5 MCG/ACT AERS Inhale 2 puffs into the lungs daily. 60 each 12   WIXELA INHUB 100-50 MCG/ACT AEPB INHALE 1 DOSE BY MOUTH TWICE A DAY 180 each 1   No current facility-administered medications for this visit.    Allergies  Allergen Reactions   Clarithromycin Hives and Nausea Only    Hot   Oxycodone Hives      Social History   Socioeconomic History   Marital status: Single    Spouse name: Not on file   Number of children: 0   Years of education: Not on file   Highest education level: Not on file  Occupational History   Occupation: auditor  Tobacco Use   Smoking status: Former    Current packs/day: 0.00    Average packs/day: 0.3 packs/day for 20.0 years (5.0 ttl pk-yrs)    Types: Cigarettes    Start date: 2002    Quit date: 2022     Years since quitting: 3.6   Smokeless tobacco: Never  Vaping Use   Vaping status: Never Used  Substance and Sexual Activity   Alcohol use: Not Currently    Comment: rarely   Drug use: Not Currently    Frequency: 1.0 times per week    Types: Marijuana    Comment: occasional    Sexual activity: Yes    Partners: Female, Female    Birth control/protection: Condom  Other Topics Concern   Not on file  Social History Narrative   Not on file   Social Drivers of Health   Financial Resource Strain: Low Risk  (07/01/2024)   Received from [facility]   Overall Financial Resource Strain (CARDIA)    Difficulty of Paying Living Expenses: Not hard at all  Food  Insecurity: No Food Insecurity (07/01/2024)   Received from [facility]   Hunger Vital Sign    Within the past 12 months, you worried that your food would run out before you got the money to buy more.: Never true    Within the past 12 months, the food you bought just didn't last and you didn't have money to get more.: Never true  Transportation Needs: No Transportation Needs (07/01/2024)   Received from [facility] - Transportation    In the past 12 months, has lack of transportation kept you from medical appointments or from getting medications?: No    Lack of Transportation (Non-Medical): No  Physical Activity: Inactive (05/31/2021)   Exercise Vital Sign    Days of Exercise per Week: 0 days    Minutes of Exercise per Session: 0 min  Stress: No Stress Concern Present (05/31/2021)   [employer] of Occupational Health - Occupational Stress Questionnaire    Feeling of Stress : Only a little  Social Connections: Moderately Isolated (05/21/2024)   Social Connection and Isolation Panel    Frequency of Communication with Friends and Family: More than three times a week    Frequency of Social Gatherings with Friends and Family: More than three times a week    Attends Religious  Services: More than 4 times per year    Active Member of [employer] or Organizations: No    Attends Banker Meetings: Never    Marital Status: Never married  Intimate Partner Violence: Not At Risk (05/21/2024)   Humiliation, Afraid, Rape, and Kick questionnaire    Fear of Current or Ex-Partner: No    Emotionally Abused: No    Physically Abused: No    Sexually Abused: No      Family History  Problem Relation Age of Onset   Heart disease Mother    Diabetes Mother    Breast cancer Maternal Grandmother    Heart disease Maternal Grandfather     Lab Results  Component Value Date   CREATININE 0.72 06/24/2024   CREATININE 0.88 06/09/2024   CREATININE 0.86 06/02/2024     Vitals:   08/04/24 1210  BP: 118/80  Pulse: 92  SpO2: (!) 88%  Weight: 99.8 kg   Wt Readings from Last 3 Encounters:  08/04/24 99.8 kg  07/13/24 98.6 kg  07/07/24 99.8 kg    PHYSICAL EXAM:  General: Well appearing, no acute signs of distress Cor: No JVD. Regular rhythm, rate.  Lungs: Clear, symmetrical expansion, no cough  Abdomen: Soft, nontender, nondistended. Extremities: No edema.  Neuro: AO x 4. 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 EF 55-60%, moderately reduced RV - continue bisoprolol  2.5mg  daily - continue farxiga  10mg  daily - continue furosemide  20mg  daily; instructed to take additional 20mg  daily if develops overnight weight gain or worsening edema - continue losartan  12.5mg  daily - may not be able to tolerate MRA and she has had K+ in the low 5's in the past - aware to limit  fluids to 60-64 ounces daily and limit sodium to <  2000mg  daily - saw cardiology ([place]) 05/25 - BNP 05/22/24 was 208.7  2: pHTN with ASD- - increase Sildenafil  to 40mg  TID  - Continue Opsumit  10mg  daily - plan to repeat RHC in Sept/ Oct to re-check pressures - saw pulmonology (Dgayli) 07/25 - PFT's done by pulmonology 06/21/24 - wearing her oxygen most of the time even when asleep with her CPAP - TSH 06/09/24 was 1.459 - ANA mildly + - RHC 05/18/24:  1 Severe pulmonary hypertension (mean PA 52 mmHg, PVR 10 WU).  2 Normal left heart filling pressure (PCWP 10 mmHg).  3 Upper normal to mildly elevated right heart filling pressure (mean RA 7, RVEDP 10 mmHg).  4 Mildly to moderately reduced Fick cardiac output/index (CO 4.2 L/min, CI 2.1 L/min/m^2).  3: HTN- - BP 118/80 - saw PCP [place]) 06/25 - BMET 06/24/24 reviewed: sodium 138, potassium 4.6, creatinine 0.72 & GFR >60 - BNP and CBC ordered today prior to catherization   3: CVA- - acute right thalamic stroke 06/25 - Continue ASA 81 daily - saw neurology 07/25  4: Hyperlipidemia- - LDL 05/22/24 was 113 (was 141 in 11/24) - continue atorvastatin  40mg  daily  5: COPD- - saw pulmonology (Dgayli) 07/25 - continue spiriva  and wixela - albuterol  PRN - 6 minute walk test has been done   Return post catherization approximately 1 month, sooner if needed.   Solomon Blumenthal, FNP-S 08/04/24

## 2024-08-06 ENCOUNTER — Encounter: Payer: Self-pay | Admitting: Family

## 2024-08-08 ENCOUNTER — Other Ambulatory Visit: Payer: Self-pay | Admitting: Nurse Practitioner

## 2024-08-08 DIAGNOSIS — F419 Anxiety disorder, unspecified: Secondary | ICD-10-CM

## 2024-08-10 NOTE — Telephone Encounter (Signed)
 Requested Prescriptions  Pending Prescriptions Disp Refills   escitalopram  (LEXAPRO ) 10 MG tablet [Pharmacy Med Name: ESCITALOPRAM  10 MG TABLET] 90 tablet 0    Sig: TAKE 1 TABLET BY MOUTH EVERY DAY     Psychiatry:  Antidepressants - SSRI Passed - 08/10/2024 12:05 PM      Passed - Completed PHQ-2 or PHQ-9 in the last 360 days      Passed - Valid encounter within last 6 months    Recent Outpatient Visits           2 months ago Hospital discharge follow-up   [facility] Gareth Clarity F, FNP   3 months ago Heart failure with preserved ejection fraction, unspecified HF chronicity [facility])   [facility] Gareth Clarity FALCON, FNP   4 months ago Hospital discharge follow-up   [facility] Gareth Clarity FALCON, FNP   5 months ago    [facility] Gareth Clarity F, FNP   5 months ago Moderate persistent asthma with acute exacerbation   [facility] Gareth Clarity FALCON, FNP       Future Appointments             In 1 month Gerard Frederick, NP [facility]

## 2024-08-16 ENCOUNTER — Encounter: Payer: Self-pay | Admitting: Family

## 2024-08-17 NOTE — Progress Notes (Signed)
 [employer]  at [facility]  Pulmonary Hypertension Program                                    Virginia Lynwood Abu, MD--Director (Pulmonary)   Virginia CROME. Janie, MD--Associate Director (Pulmonary)  Virginia Constant, MD (Pulmonary)  Virginia Chatters, MD (Cardiology)  Virginia Eans, RN Nurse Coordinator   Virginia Kubas, RN Nurse Coordinator  [phone] office  [phone] fax     [name]  is a 43 y.o. female  patient referred by Virginia Hose, MD for work up and evaluation for pulmonary hypertension.  PAH Treatment History:  currently on macitentan  and sildenafil  started at [facility].   Pt describes a history of prior diagnosis of pulmonary hypertension at [place].  She was started on sildenafil  and then later maci by the heart failure clinic there.   She has felt improved on this but is still limited in exertion.  There is a plan in place to repeat a RHC there soon to reassess pulmonary hemodynamics and consdier if ASD closure is possible.    She requires supplemental O2 for exertion and sleep.  She also uses CPAP for OSA.  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  PFT show severe obstruction.    No LE edema.    No syncope or near syncope.   No history of VTE known. No prior anorexigen or stimulant use.  No family history of PH.    Assessment:    Virginia Camacho is a 43 y.o.female with possible WHO Diagnostic Group 1: pulmonary arterial hypertension which is idiopathic and associated with congenital heart diseases atrial septal defect (ASD)  and WHO Diagnostic Group 3: pulmonary hypertension associated with lung diseases and/or hypoxemia associated with ILD and possible contribution from OSA though this is treated with CPAP.  The patient's current NYHA functional class is Class 2 - comfortable at rest but have symptoms with ordinary physcial activity. and Class 3 -  comfortable at rest but have symptoms with less-than-ordinary effort..     PH can be multifactorial--ASD, possible LAM, ? Some emphysematous component.  Also OSA but this is treated.   Had prior stroke so concern that ASD could contribute, however not clear if safe to close at any point given her severe PH.    Would favor cardiac MR reassessment of RV and PH and also allow for assessment of the presence of ASD.    PFT c/w significant obstruction.  She is currently on Spiriva , Wixela and prn albuterol .  May have better response to all aerosolized regimen, so will try consolidating to Breztri.  Has had ongoing cough and sputum production.    Current Reproductive Status:  of reproductive potential.  Counseled re pregnancy avoidance.   Plan:     1) cardiac MRI in 2 months after allowing more time for macitentan  therapy and recent sildenafil  increase to reach maximal effect and to assess for presence of ASD.  2) if still signs of PH/RV dysfunction/strain on cMRI, then start inhaled treprostinil.  If it looks improved, consider repeat RHC at that time.  Given her ILD, application of inhaled treprostinil would be a more optimal PH therapy approach in general.  3) continue suppl O2 with exertion and sleep, including CPAP with sleep.     4) appt with Dr. Diana as scheduled to eval ILD/LAM and  also may consider possibility of transplant evaluation.    Subjective:    Past medical history is significant for:  Past Medical History[1]  Family History[2]  Social History [3]  Past Surgical History[4]   Objective:  Objective Review of systems is significant for:   As stated in the HPI, remainder of 12 system review is negative.   Allergies[5]  Current Medications[6]   Physical Exam:   height is 152.5 cm (5' 0.04) and weight is 99.3 kg (219 lb). Her temporal temperature is 36.2 C (97.1 F). Her blood pressure is 130/80 and her pulse is 84. Her oxygen saturation is 90%.  General  Appearance:    Alert, cooperative, no distress, appears stated age  Head:    Normocephalic, without obvious abnormality, atraumatic  Eyes:    anicteric  Nose:   Nares normal to external exam  Oropharynx:   No lesions  Neck:   Supple, trachea midline, no adenopathy;   No JVD  Lungs:     Good breath sounds b/l, +crackles  Chest Wall:    No tenderness or deformity   Heart:    Regular rate and rhythm, S1 and S2 normal, no murmur noted.  Abdomen:     Soft, non-tender  Extremities:   Extremities normal, no cyanosis, clubbing, or edema  Skin:   No rashes or lesions on clothed exam  Lymph nodes:   No cervical or supraclavicular adenopathy  Neurologic:   No focal neurological deficits    Diagnostic Review:   CT scan PE protocol performed on 05/2024:  FINDINGS:  Cardiovascular: This is a technically adequate evaluation of the  pulmonary vasculature. No filling defects or pulmonary emboli.   Mild cardiomegaly without pericardial effusion. Normal caliber of  the thoracic aorta.   Mediastinum/Nodes: Stable posterior mediastinal mass which appears  associated with the serosal aspect of the distal esophagus,  measuring 7.1 x 5.2 cm reference image 83/4, with attenuation of 24  HU. This remains indeterminate, and could reflect forgot duplication  cyst, leiomyoma, or lymphadenopathy. Stable borderline enlarged  mediastinal lymph nodes. Thyroid  and trachea are unremarkable.   Lungs/Pleura: Diffuse emphysema again noted. There is continued  diffuse interstitial prominence with progressive consolidation in  the dependent right lower lobe compatible with aspiration or  infection. Continued curvilinear consolidation within the right  middle lobe may reflect atelectasis or airspace disease. No effusion  or pneumothorax. Central airways are patent.   Upper Abdomen: No acute abnormality.   Musculoskeletal: No acute or destructive bony abnormalities.  Reconstructed images demonstrate no additional  findings.   Review of the MIP images confirms the above findings.   IMPRESSION:  1. No evidence of pulmonary embolus.  2. Emphysema, with continued diffuse interstitial prominence and  progressive right basilar consolidation. Findings would favor  progressive infection or aspiration, though superimposed edema  cannot be excluded.  3. Stable low-attenuation posterior mediastinal mass which appears  associated with the serosal aspect of the distal esophagus.  Differential diagnosis remains forget duplication cyst, leiomyoma,  or lymphadenopathy.  4. Stable borderline enlarged upper mediastinal lymph nodes.  5. Mild cardiomegaly.    Pulmonary Function Test performed on 08/17/24 show:     6 Minute Walk Test performed on 08/17/24:     Echo performed on 06/2024 at [place]:   IMPRESSIONS     1. Large grade 4 shunt, appears interatrial. Consider TEE for further evaluation. With right heart enlargement and dysfunction, high suspicion for atrial septal defect or large PFO. Agitated saline contrast bubble  study was positive with shunting  observed within 3-6 cardiac cycles suggestive of interatrial shunt.   2. Left ventricular ejection fraction, by estimation, is 55 to 60%. The left ventricle has normal function. There is the interventricular septum is flattened in systole and diastole, consistent with right ventricular pressure and volume overload.   3. Right ventricular systolic function is moderately reduced. The right ventricular size is moderately enlarged. There is normal pulmonary artery systolic pressure. The estimated right ventricular systolic pressure is 30.0 mmHg.   4. The mitral valve is grossly normal.   5. The aortic valve is tricuspid.   FINDINGS   Left Ventricle: Left ventricular ejection fraction, by estimation, is 55 to 60%. The left ventricle has normal function. The interventricular septum is flattened in systole and diastole, consistent with right ventricular pressure  and volume overload.   Right Ventricle: The right ventricular size is moderately enlarged. Right ventricular systolic function is moderately reduced. There is normal pulmonary artery systolic pressure. The tricuspid regurgitant velocity is 2.60 m/s, and with an assumed right  atrial pressure of 3 mmHg, the estimated right ventricular systolic pressure is 30.0 mmHg.   Mitral Valve: The mitral valve is grossly normal.   Tricuspid Valve: The tricuspid valve is grossly normal. Tricuspid valve regurgitation is trivial.   Aortic Valve: The aortic valve is tricuspid.   Pulmonic Valve: Pulmonic valve regurgitation is trivial.   IAS/Shunts: The interatrial septum was not well visualized. Agitated saline contrast was given intravenously to evaluate for intracardiac shunting. Agitated saline contrast bubble study was positive with shunting observed within 3-6 cardiac cycles  suggestive of interatrial shunt.    Catheterization performed on date 05/18/24 at [place]:   Right Heart Pressures  RA (mean): 7 mmHg  RV (S/EDP): 85/10 mmHg  PA (S/D, mean): 85/36 (52) mmHg  PCWP (mean): 10 mmHg   Ao sat: 94%  PA sat: 67%   Fick CO: 4.2 L/min  Fick CI: 2.1 L/min/m^2   PVR: 10 Wood units           [1] No past medical history on file. [2] No family history on file. [3] Social History Socioeconomic History  . Marital status: Single  Tobacco Use  . Smoking status: Former    Types: Cigarettes  . Smokeless tobacco: Current   Social Drivers of Corporate investment banker Strain: Low Risk  (07/01/2024)   Received from [facility]   Overall Financial Resource Strain (CARDIA)   . Difficulty of Paying Living Expenses: Not hard at all  Food Insecurity: No Food Insecurity (07/01/2024)   Received from [facility]   Hunger Vital Sign   . Within the past 12 months, you worried that your food would run out before you got the money to buy more.: Never true   .  Within the past 12 months, the food you bought just didn't last and you didn't have money to get more.: Never true  Transportation Needs: No Transportation Needs (07/01/2024)   Received from [facility]   [facility] - Transportation   . In the past 12 months, has lack of transportation kept you from medical appointments or from getting medications?: No   . Lack of Transportation (Non-Medical): No  Physical Activity: Inactive (05/31/2021)   Received from [facility]   Exercise Vital Sign   . On average, how many days per week do you engage in moderate to strenuous exercise (like a brisk walk)?: 0 days   .  On average, how many minutes do you engage in exercise at this level?: 0 min  Stress: No Stress Concern Present (05/31/2021)   Received from [facility] - Occupational Stress Questionnaire   . Feeling of Stress : Only a little  Social Connections: Moderately Isolated (05/21/2024)   Received from [facility]   Social Connection and Isolation Panel   . In a typical week, how many times do you talk on the phone with family, friends, or neighbors?: More than three times a week   . How often do you get together with friends or relatives?: More than three times a week   . How often do you attend church or religious services?: More than 4 times per year   . Do you belong to any clubs or organizations such as church groups, unions, fraternal or athletic groups, or school groups?: No   . How often do you attend meetings of the clubs or organizations you belong to?: Never   . Are you married, widowed, divorced, separated, never married, or living with a partner?: Never married  Housing: Unknown (07/01/2024)   Received from [facility]   Housing Stability Vital Sign   . In the last 12 months, was there a time when you were not able to pay the mortgage or rent on time?: No   . At any time in the past 12 months, were you homeless  or living in a shelter (including now)?: No  [4] No past surgical history on file. [5] Allergies Allergen Reactions  . Clarithromycin Muscle Pain, Dizziness, Hives and Nausea Only    Hot  . Oxycodone Hives    Pt states she can tolerate hydrocodone  [6] No current outpatient medications on file.   No current facility-administered medications for this visit.

## 2024-08-18 ENCOUNTER — Telehealth: Payer: Self-pay | Admitting: Family

## 2024-08-18 ENCOUNTER — Telehealth: Payer: Self-pay

## 2024-08-18 DIAGNOSIS — I5032 Chronic diastolic (congestive) heart failure: Secondary | ICD-10-CM

## 2024-08-18 MED ORDER — ATORVASTATIN CALCIUM 40 MG PO TABS: 40.0000 mg | ORAL_TABLET | Freq: Every day | ORAL | 2 refills | Status: DC

## 2024-08-18 MED ORDER — ASPIRIN 81 MG PO TBEC: 81.0000 mg | DELAYED_RELEASE_TABLET | Freq: Every day | ORAL | 2 refills | Status: DC

## 2024-08-18 NOTE — Telephone Encounter (Signed)
 Placed orders per Ellouise Class FNP.

## 2024-08-18 NOTE — Telephone Encounter (Deleted)
 Virginia Camacho

## 2024-08-18 NOTE — Telephone Encounter (Signed)
 Faxed return to work form to [employer] per pt request. Original to be scanned into chart

## 2024-08-18 NOTE — Telephone Encounter (Addendum)
 Patient stopped in office yesterday to drop off work forms and to cancel her upcoming RHC saying that [facility] provider, Dr. Primus, told her to cancel it.   Called and spoke with patient to discuss. She says that the [facility] provider told her that he wasn't sure she had an ASD and that she should be on pHTN treatment longer before doing RHC. He suggested (per patient) a cMRI first and then push back the RHC. 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 She is very concerned about her stroke risk with the cath since she had a stroke 3 days after her previous cath and is wondering if she should resume plavix .   Discussed with Dr. Gardenia patient's concerns and what she says she was told yesterday. Will schedule cMRI and keep the RHC on the schedule. Plan to get cMRI as quickly as possible and hopefully before the RHC. Pending cMRI results, may push the RHC out. Patient to continue taking her meds as she's currently doing. Also advised patient that any resumption of plavix  would need to come from neurology as 06/25 discharge said ASA 81 mg daily + Plavix  75 mg daily x 3 weeks, then ASA 81 daily monotherapy.    Also discussed that depending on treatment options/ outcome of LAM diagnosis, it would be beneficial for her to have all her medical care within the same institution [facility]).   Patient verbalized understanding and appreciated the return phone call.

## 2024-08-23 ENCOUNTER — Encounter: Payer: Self-pay | Admitting: Family

## 2024-08-23 ENCOUNTER — Encounter: Payer: Self-pay | Admitting: Nurse Practitioner

## 2024-08-23 ENCOUNTER — Ambulatory Visit: Admitting: Nurse Practitioner

## 2024-08-23 VITALS — BP 124/68 | HR 95 | Temp 98.1°F | Resp 18 | Ht 64.0 in | Wt 220.0 lb

## 2024-08-23 DIAGNOSIS — G4733 Obstructive sleep apnea (adult) (pediatric): Secondary | ICD-10-CM | POA: Diagnosis not present

## 2024-08-23 DIAGNOSIS — I272 Pulmonary hypertension, unspecified: Secondary | ICD-10-CM

## 2024-08-23 DIAGNOSIS — D509 Iron deficiency anemia, unspecified: Secondary | ICD-10-CM

## 2024-08-23 DIAGNOSIS — I5030 Unspecified diastolic (congestive) heart failure: Secondary | ICD-10-CM

## 2024-08-23 DIAGNOSIS — F419 Anxiety disorder, unspecified: Secondary | ICD-10-CM

## 2024-08-23 DIAGNOSIS — E66813 Obesity, class 3: Secondary | ICD-10-CM

## 2024-08-23 DIAGNOSIS — E785 Hyperlipidemia, unspecified: Secondary | ICD-10-CM

## 2024-08-23 DIAGNOSIS — Z6841 Body Mass Index (BMI) 40.0 and over, adult: Secondary | ICD-10-CM

## 2024-08-23 DIAGNOSIS — Z23 Encounter for immunization: Secondary | ICD-10-CM | POA: Diagnosis not present

## 2024-08-23 DIAGNOSIS — Z8673 Personal history of transient ischemic attack (TIA), and cerebral infarction without residual deficits: Secondary | ICD-10-CM

## 2024-08-23 DIAGNOSIS — J42 Unspecified chronic bronchitis: Secondary | ICD-10-CM | POA: Diagnosis not present

## 2024-08-23 DIAGNOSIS — J4540 Moderate persistent asthma, uncomplicated: Secondary | ICD-10-CM

## 2024-08-23 DIAGNOSIS — F330 Major depressive disorder, recurrent, mild: Secondary | ICD-10-CM

## 2024-08-23 NOTE — Progress Notes (Signed)
 BP 124/68   Pulse 95   Temp 98.1 F (36.7 C)   Resp 18   Ht 5' 4 (1.626 m)   Wt 220 lb (99.8 kg)   LMP 08/12/2024   SpO2 (!) 89%   BMI 37.76 kg/m    Subjective:    Patient ID: Virginia Camacho, female    DOB: [birth year], 43 y.o.   MRN: [id number]  HPI: Virginia Camacho is a 43 y.o. female  Chief Complaint  Patient presents with   Medical Management of Chronic Issues   Discussed the use of AI scribe software for clinical note transcription with the patient, who gave verbal consent to proceed.  History of Present Illness Virginia Camacho is a 43 year old female with heart failure, pulmonary hypertension, and emphysema who presents for a four-month follow-up.  Dyspnea and cough - Chronic dyspnea associated with heart failure, pulmonary hypertension, emphysema, COPD, and asthma - Dry cough since starting Breztri inhaler around Thursday; cough occasionally becomes wet with phlegm movement - Sensation of being 'super dry' since inhaler switch - No pain, fever, or significant changes in blood pressure or breathing - Oxygen saturation remains between 88% and 92% - On supplemental oxygen  Pulmonary hypertension and cardiac evaluation - History of pulmonary hypertension and heart failure - Last heart failure clinic visit on August 04, 2024 - Plan for right heart catheterization (RHC) postponed to allow more time for pulmonary hypertension treatment and to obtain cardiac MRI first - 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, suspected etiology of prior stroke - Concern about undergoing another RHC due to previous stroke following the procedure  Obstructive and restrictive lung disease - History of emphysema, COPD, and asthma - Recent switch to Breztri inhaler; also using albuterol  inhaler as needed, Spiriva , and Wixela - Scheduled to see a specialist for 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) later this month - Anxious about potential need for lung  transplant  Sleep-disordered breathing - History of sleep apnea  Neurovascular history - History of stroke, suspected secondary to intracardiac shunt  Hematologic and metabolic status - Iron  deficiency and anemia - Recent CBC and BMP unremarkable - Iron  supplementation with 324 mg daily  Metabolic syndrome and obesity - Obesity with current weight 220 pounds, BMI 37.76, waist circumference 44.5 inches - Hyperlipidemia; last lipid panel in June showed LDL 113 - A1c 5.6 - Last thyroid  test in July was normal  Psychological symptoms - Anxiety and depression - Increased anxiety regarding possible need for lung transplant and upcoming specialist evaluation     Flowsheet Row Office Visit from 08/23/2024 in [facility]  1 44.5 inches        08/23/2024    8:27 AM 04/21/2024    9:26 AM 03/11/2024    2:51 PM  Depression screen PHQ 2/9  Decreased Interest 0 0 0  Down, Depressed, Hopeless 0 0 0  PHQ - 2 Score 0 0 0  Altered sleeping 0 0 1  Tired, decreased energy 0 0 1  Change in appetite 0 0 1  Feeling bad or failure about yourself  0 0 0  Trouble concentrating 0 0 0  Moving slowly or fidgety/restless 0 0 0  Suicidal thoughts 0 0 0  PHQ-9 Score 0 0 3  Difficult doing work/chores Not difficult at all Not difficult at all Not difficult at all    Relevant past medical, surgical, family and social history reviewed and updated as indicated. Interim medical history since  our last visit reviewed. Allergies and medications reviewed and updated.  Review of Systems  Per HPI unless specifically indicated above     Objective:      BP 124/68   Pulse 95   Temp 98.1 F (36.7 C)   Resp 18   Ht 5' 4 (1.626 m)   Wt 220 lb (99.8 kg)   LMP 08/12/2024   SpO2 (!) 89%   BMI 37.76 kg/m    Wt Readings from Last 3 Encounters:  08/23/24 220 lb (99.8 kg)  08/04/24 220 lb (99.8 kg)  07/13/24 217 lb 6 oz (98.6 kg)    Physical Exam VITALS: SaO2-  89% MEASUREMENTS: Weight- 220, BMI- 37.76. GENERAL: Alert, cooperative, well developed, no acute distress. HEENT: Normocephalic, normal oropharynx, moist mucous membranes. CHEST: Clear to auscultation bilaterally, no wheezes, rhonchi, or crackles. CARDIOVASCULAR: Regular rate and rhythm, S1 and S2 normal without murmurs. ABDOMEN: Soft, non-tender, non-distended, without organomegaly, normal bowel sounds. EXTREMITIES: No cyanosis or edema. NEUROLOGICAL: Cranial nerves grossly intact, moves all extremities without gross motor or sensory deficit.  Results for orders placed or performed during the hospital encounter of 08/04/24  Basic metabolic panel   Collection Time: 08/04/24  1:48 PM  Result Value Ref Range   Sodium 139 135 - 145 mmol/L   Potassium 4.2 3.5 - 5.1 mmol/L   Chloride 104 98 - 111 mmol/L   CO2 24 22 - 32 mmol/L   Glucose, Bld 86 70 - 99 mg/dL   BUN 16 6 - 20 mg/dL   Creatinine, Ser 9.22 0.44 - 1.00 mg/dL   Calcium  9.4 8.9 - 10.3 mg/dL   GFR, Estimated >39 >39 mL/min   Anion gap 11 5 - 15  CBC   Collection Time: 08/04/24  1:48 PM  Result Value Ref Range   WBC 4.8 4.0 - 10.5 K/uL   RBC 6.16 (H) 3.87 - 5.11 MIL/uL   Hemoglobin 16.6 (H) 12.0 - 15.0 g/dL   HCT 48.7 (H) 63.9 - 53.9 %   MCV 83.1 80.0 - 100.0 fL   MCH 26.9 26.0 - 34.0 pg   MCHC 32.4 30.0 - 36.0 g/dL   RDW 85.3 88.4 - 84.4 %   Platelets 258 150 - 400 K/uL   nRBC 0.0 0.0 - 0.2 %          Assessment & Plan:   Problem List Items Addressed This Visit       Cardiovascular and Mediastinum   (HFpEF) heart failure with preserved ejection fraction (HCC) - Primary   Pulmonary hypertension, unspecified (HCC)     Respiratory   Asthma   Relevant Medications   BREZTRI AEROSPHERE 160-9-4.8 MCG/ACT AERO inhaler   OSA (obstructive sleep apnea)   COPD (chronic obstructive pulmonary disease) (HCC)   Relevant Medications   BREZTRI AEROSPHERE 160-9-4.8 MCG/ACT AERO inhaler     Other   Class 3 severe obesity  due to excess calories with serious comorbidity and body mass index (BMI) of 40.0 to 44.9 in adult   Hyperlipidemia   Anxiety   Iron  deficiency anemia   Mild episode of recurrent major depressive disorder (HCC)   Hx of stroke with residual deficits   Other Visit Diagnoses       Need for influenza vaccination       Relevant Orders   Flu vaccine trivalent PF, 6mos and older(Flulaval,Afluria,Fluarix,Fluzone) (Completed)        Assessment and Plan Assessment & Plan 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 Concerns about previous stroke post-catheterization and potential lung transplant. Awaiting decision on right heart catheterization based on cardiac MRI results. - Continue macitentan  and sildenafil  - Schedule cardiac MRI - Continue aspirin  therapy  Chronic obstructive pulmonary disease (COPD) and emphysema with asthma COPD and emphysema with asthma. Recently switched to Breztri inhaler, causing dry cough. Oxygen saturation between 88-92%. 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 - Attend specialist appointment at [facility] for LAM evaluation  Obstructive sleep apnea Obstructive sleep apnea.  Obesity, class 3 Obesity with a BMI of 37.76.  Iron  deficiency anemia Iron  deficiency anemia.  Anxiety and depression Anxiety and depression with concerns about upcoming medical tests and procedures, fear of another stroke, and anxiety about potential lung transplant. - Encourage communication with healthcare providers about concerns  Hyperlipidemia Hyperlipidemia managed with atorvastatin . Last lipid panel in June showed LDL of 113 mg/dL. - Continue atorvastatin  therapy  Follow-Up Follow-up plans discussed for ongoing  management of conditions and upcoming tests. - Communicate with healthcare providers regarding test results and next steps        Follow up plan: Return in about 6 months (around 02/20/2025) for follow up.

## 2024-08-25 ENCOUNTER — Telehealth: Payer: Self-pay

## 2024-08-25 ENCOUNTER — Telehealth: Payer: Self-pay | Admitting: Family

## 2024-08-25 NOTE — Telephone Encounter (Signed)
 Cancelled cath per [facility]. Pt to follow up with [facility] Dr. Primus

## 2024-08-25 NOTE — Telephone Encounter (Signed)
 Spoke with patient regarding her upcoming RHC that is currently scheduled for next week. She currently has cMRI (for Dr. Primus) scheduled 11/25 at [facility] to allow for more time on pHTN medications before rechecking pressures. Per Dr Georgian note on 08/17/24, depending on results of cMRI, would consider RHC after that time.   Is also seeing Dr Diana at [facility] to be evaluated for LAM and possible transplant evaluation. Based on her currently seeing both Dr. Sherrell & Dr Diana at [facility] with plans to do workup with them, will cancel her RHC that is currently scheduled at [facility] next week. Patient voices appreciation for cancelling this procedure as she hopes to postpone a cath as long as she can (she had stroke post previous cath).   Advised her to keep us  posted and reach out if there's anything we can assist with.

## 2024-09-02 ENCOUNTER — Encounter (HOSPITAL_COMMUNITY): Admission: RE | Payer: Self-pay | Source: Home / Self Care

## 2024-09-02 ENCOUNTER — Ambulatory Visit (HOSPITAL_COMMUNITY): Admission: RE | Admit: 2024-09-02 | Source: Home / Self Care | Admitting: Cardiology

## 2024-09-02 SURGERY — RIGHT HEART CATH
Anesthesia: LOCAL

## 2024-09-13 DIAGNOSIS — Z029 Encounter for administrative examinations, unspecified: Secondary | ICD-10-CM

## 2024-09-16 ENCOUNTER — Ambulatory Visit: Admitting: Cardiology

## 2024-09-28 ENCOUNTER — Encounter: Payer: Self-pay | Admitting: Family

## 2024-10-24 ENCOUNTER — Other Ambulatory Visit: Payer: Self-pay | Admitting: Nurse Practitioner

## 2024-10-24 DIAGNOSIS — J4520 Mild intermittent asthma, uncomplicated: Secondary | ICD-10-CM

## 2024-10-26 NOTE — Telephone Encounter (Signed)
 Requested medication (s) are due for refill today: yes  Requested medication (s) are on the active medication list: yes  Last refill:  05/12/24  Future visit scheduled: no  Notes to clinic:  historical medication     Requested Prescriptions  Pending Prescriptions Disp Refills   albuterol  (VENTOLIN  HFA) 108 (90 Base) MCG/ACT inhaler [Pharmacy Med Name: Albuterol  Sulfate HFA 108 (90 Base) MCG/ACT Inhalation Aerosol Solution] 9 g 0    Sig: INHALE 2 PUFFS BY MOUTH EVERY 6 HOURS AS NEEDED FOR WHEEZING AND FOR SHORTNESS OF BREATH     Pulmonology:  Beta Agonists 2 Passed - 10/26/2024  4:03 PM      Passed - Last BP in normal range    BP Readings from Last 1 Encounters:  08/23/24 124/68         Passed - Last Heart Rate in normal range    Pulse Readings from Last 1 Encounters:  08/23/24 95         Passed - Valid encounter within last 12 months    Recent Outpatient Visits           2 months ago Heart failure with preserved ejection fraction, unspecified HF chronicity   [facility] Gareth Mliss [place], FNP   4 months ago Hospital discharge follow-up   [facility] Gareth Mliss F, FNP   6 months ago Heart failure with preserved ejection fraction, unspecified HF chronicity   [facility] Gareth Mliss F[place], FNP   7 months ago Hospital discharge follow-up   [facility] Gareth Mliss F[place], FNP   7 months ago    [facility] [place], Mliss [place], [place]

## 2024-11-05 ENCOUNTER — Other Ambulatory Visit (HOSPITAL_COMMUNITY): Payer: Self-pay

## 2024-11-12 ENCOUNTER — Other Ambulatory Visit: Payer: Self-pay | Admitting: Nurse Practitioner

## 2024-11-12 DIAGNOSIS — F419 Anxiety disorder, unspecified: Secondary | ICD-10-CM

## 2024-11-14 ENCOUNTER — Encounter: Payer: Self-pay | Admitting: Family

## 2024-11-15 NOTE — Telephone Encounter (Signed)
 Requested Prescriptions  Pending Prescriptions Disp Refills   escitalopram  (LEXAPRO ) 10 MG tablet [Pharmacy Med Name: ESCITALOPRAM  10 MG TABLET] 90 tablet 0    Sig: TAKE 1 TABLET BY MOUTH EVERY DAY     Psychiatry:  Antidepressants - SSRI Passed - 11/15/2024  1:25 PM      Passed - Completed PHQ-2 or PHQ-9 in the last 360 days      Passed - Valid encounter within last 6 months    Recent Outpatient Visits           2 months ago Heart failure with preserved ejection fraction, unspecified HF chronicity   [facility] Gareth Mliss F[place], FNP   5 months ago Hospital discharge follow-up   [facility] Gareth Mliss F, FNP   6 months ago Heart failure with preserved ejection fraction, unspecified HF chronicity   [facility] Gareth Mliss F[place], FNP   8 months ago Hospital discharge follow-up   [facility] Gareth Mliss FALCON, FNP   8 months ago    [facility] [place], Mliss [place], [place]

## 2024-11-17 ENCOUNTER — Other Ambulatory Visit: Payer: Self-pay | Admitting: Nurse Practitioner

## 2024-11-17 DIAGNOSIS — J4520 Mild intermittent asthma, uncomplicated: Secondary | ICD-10-CM

## 2024-11-18 NOTE — Telephone Encounter (Signed)
 Requested Prescriptions  Pending Prescriptions Disp Refills   albuterol  (VENTOLIN  HFA) 108 (90 Base) MCG/ACT inhaler [Pharmacy Med Name: Albuterol  Sulfate HFA 108 (90 Base) MCG/ACT Inhalation Aerosol Solution] 9 g 5    Sig: INHALE 2 PUFFS BY MOUTH EVERY 6 HOURS AS NEEDED FOR WHEEZING AND FOR SHORTNESS OF BREATH     Pulmonology:  Beta Agonists 2 Passed - 11/18/2024  5:37 PM      Passed - Last BP in normal range    BP Readings from Last 1 Encounters:  08/23/24 124/68         Passed - Last Heart Rate in normal range    Pulse Readings from Last 1 Encounters:  08/23/24 95         Passed - Valid encounter within last 12 months    Recent Outpatient Visits           2 months ago Heart failure with preserved ejection fraction, unspecified HF chronicity   [facility] Gareth Mliss [place], FNP   5 months ago Hospital discharge follow-up   [facility] Gareth Mliss F, FNP   7 months ago Heart failure with preserved ejection fraction, unspecified HF chronicity   [facility] Gareth Mliss F[place], FNP   8 months ago Hospital discharge follow-up   [facility] Gareth Mliss F[place], FNP   8 months ago    [facility] [place], [place], [place]

## 2024-11-19 ENCOUNTER — Other Ambulatory Visit (HOSPITAL_COMMUNITY): Payer: Self-pay

## 2024-11-19 MED ORDER — ATORVASTATIN CALCIUM 40 MG PO TABS: 40.0000 mg | ORAL_TABLET | Freq: Every day | ORAL | 2 refills | Status: AC

## 2024-11-19 MED ORDER — ASPIRIN 81 MG PO TBEC: 81.0000 mg | DELAYED_RELEASE_TABLET | Freq: Every day | ORAL | 2 refills | Status: AC

## 2024-11-25 ENCOUNTER — Encounter: Payer: Self-pay | Admitting: Nurse Practitioner

## 2024-12-07 ENCOUNTER — Encounter: Payer: Self-pay | Admitting: Nurse Practitioner

## 2024-12-07 ENCOUNTER — Telehealth: Payer: Self-pay | Admitting: Nurse Practitioner

## 2024-12-07 ENCOUNTER — Other Ambulatory Visit: Payer: Self-pay | Admitting: Nurse Practitioner

## 2024-12-07 DIAGNOSIS — L309 Dermatitis, unspecified: Secondary | ICD-10-CM

## 2024-12-07 MED ORDER — MOMETASONE FUROATE 0.1 % EX CREA: TOPICAL_CREAM | Freq: Every day | CUTANEOUS | 5 refills | Status: AC

## 2024-12-07 NOTE — Telephone Encounter (Signed)
 Pt dropped off Medical Accommodation form for Mliss to fill out. Please call [phone] once ready for pick up. Pt is aware that we do close early tomorrow for new years eve. Paperwork has been placed in [employer]

## 2025-01-03 ENCOUNTER — Encounter: Payer: Self-pay | Admitting: Family

## 2025-01-06 ENCOUNTER — Other Ambulatory Visit: Payer: Self-pay | Admitting: Nurse Practitioner

## 2025-01-06 ENCOUNTER — Encounter: Payer: Self-pay | Admitting: Nurse Practitioner

## 2025-01-06 DIAGNOSIS — I5030 Unspecified diastolic (congestive) heart failure: Secondary | ICD-10-CM

## 2025-01-06 MED ORDER — DAPAGLIFLOZIN PROPANEDIOL 10 MG PO TABS: 10.0000 mg | ORAL_TABLET | Freq: Every day | ORAL | 3 refills | Status: AC
